# Patient Record
Sex: Male | Born: 1970
Health system: Southern US, Community
[De-identification: ages and names within clinical notes are randomized; demographics above are authoritative.]

## PROBLEM LIST (undated history)

## (undated) DIAGNOSIS — I10 Essential (primary) hypertension: Secondary | ICD-10-CM

## (undated) DIAGNOSIS — I2510 Atherosclerotic heart disease of native coronary artery without angina pectoris: Secondary | ICD-10-CM

## (undated) DIAGNOSIS — I213 ST elevation (STEMI) myocardial infarction of unspecified site: Secondary | ICD-10-CM

## (undated) DIAGNOSIS — R002 Palpitations: Secondary | ICD-10-CM

## (undated) DIAGNOSIS — Z8249 Family history of ischemic heart disease and other diseases of the circulatory system: Secondary | ICD-10-CM

## (undated) DIAGNOSIS — I5021 Acute systolic (congestive) heart failure: Secondary | ICD-10-CM

## (undated) DIAGNOSIS — E785 Hyperlipidemia, unspecified: Secondary | ICD-10-CM

## (undated) HISTORY — DX: Palpitations: R00.2

## (undated) HISTORY — DX: Essential (primary) hypertension: I10

## (undated) HISTORY — DX: Family history of ischemic heart disease and other diseases of the circulatory system: Z82.49

---

## 2014-02-17 ENCOUNTER — Encounter: Payer: Self-pay | Admitting: Cardiovascular Disease

## 2014-02-17 ENCOUNTER — Ambulatory Visit (INDEPENDENT_AMBULATORY_CARE_PROVIDER_SITE_OTHER): Payer: 59 | Admitting: Cardiovascular Disease

## 2014-02-17 VITALS — BP 136/98 | HR 76 | Ht 74.0 in | Wt 261.0 lb

## 2014-02-17 DIAGNOSIS — I10 Essential (primary) hypertension: Secondary | ICD-10-CM

## 2014-02-17 DIAGNOSIS — R002 Palpitations: Secondary | ICD-10-CM | POA: Insufficient documentation

## 2014-02-17 NOTE — Assessment & Plan Note (Signed)
Patient was referred for new-onset palpitations over the last 3 or 4 months. It occurs several times a week and lasts for up to 30 seconds at a time associated with chest pressure and shortness of breath. He's never had syncope. They are rapid onset and offset. I am going to obtain a 2-D echocardiogram, one-month event monitor and routine GXT

## 2014-02-17 NOTE — Progress Notes (Signed)
     02/17/2014 Devin Wright   10/30/1970  [id number]  Primary Physician Londell MohPHARR,WALTER DAVIDSON, MD Primary Cardiologist: Runell GessJonathan J. 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 He was referred by Dr. Merri BrunetteWalter Pharr for cardiovascular evaluation because of new-onset palpitations. His cardiac risk factor profile is remarkable for  Treated hypertension and family history the father had bypass surgery in his early 2360s. He also has hyperlipidemia. By history he has obstructive sleep apnea which is in the process of being worked up. In January he noticed onset of palpitations which occur several times a week lasting up to 30 seconds her time which were rapid onset and offset associated with chest discomfort and shortness of breath. Dr. Renne CriglerPharr has obtained lab work which I assume included thyroid function tests.   Current Outpatient Prescriptions  Medication Sig Dispense Refill  . losartan-hydrochlorothiazide (HYZAAR) 100-12.5 MG per tablet        No current facility-administered medications for this visit.    No Known Allergies  History   Social History  . Marital Status: Married    Spouse Name: N/A    Number of Children: N/A  . Years of Education: N/A   Occupational History  . Not on file.   Social History Main Topics  . Smoking status: Never Smoker   . Smokeless tobacco: Not on file  . Alcohol Use: Not on file  . Drug Use: Not on file  . Sexual Activity: Not on file   Other Topics Concern  . Not on file   Social History Narrative  . No narrative on file     Review of Systems: General: negative for chills, fever, night sweats or weight changes.  Cardiovascular: negative for chest pain, dyspnea on exertion, edema, orthopnea, palpitations, paroxysmal nocturnal dyspnea or shortness of breath Dermatological:  negative for rash Respiratory: negative for cough or wheezing Urologic: negative for hematuria Abdominal: negative for nausea, vomiting, diarrhea, bright red blood per rectum, melena, or hematemesis Neurologic: negative for visual changes, syncope, or dizziness All other systems reviewed and are otherwise negative except as noted above.    Blood pressure 136/98, pulse 76, height 6\' 2"  (1.88 m), weight 261 lb (118.389 kg).  General appearance: alert and no distress Neck: no adenopathy, no carotid bruit, no JVD, supple, symmetrical, trachea midline and thyroid not enlarged, symmetric, no tenderness/mass/nodules Lungs: clear to auscultation bilaterally Heart: regular rate and rhythm, S1, S2 normal, no murmur, click, rub or gallop Extremities: extremities normal, atraumatic, no cyanosis or edema  EKG not performed today  ASSESSMENT AND PLAN:   Essential hypertension On losartan hydrochlorothiazide with blood pressures elevated today at 136/98 followed by his PCP  Palpitations Patient was referred for new-onset palpitations over the last 3 or 4 months. It occurs several times a week and lasts for up to 30 seconds at a time associated with chest pressure and shortness of breath. He's never had syncope. They are rapid onset and offset. I am going to obtain a 2-D echocardiogram, one-month event monitor and routine GXT      Runell GessJonathan J. Richanda Darin MD [facility] Allied DiseasesFACP,FACC,FAHA, [facility] Junction CityFSCAI 02/17/2014 3:07 PM

## 2014-02-17 NOTE — Patient Instructions (Signed)
  We will see you back in follow up after the tests  Dr Allyson SabalBerry has ordered : 1.  Echocardiogram. Echocardiography is a painless test that uses sound waves to create images of your heart. It provides your doctor with information about the size and shape of your heart and how well your heart's chambers and valves are working. This procedure takes approximately one hour. There are no restrictions for this procedure.   2. Your physician has recommended that you wear an event monitor for 30 days. Event monitors are medical devices that record the heart's electrical activity. Doctors most often us these monitors to diagnose arrhythmias. Arrhythmias are problems with the speed or rhythm of the heartbeat. The monitor is a small, portable device. You can wear one while you do your normal daily activities. This is usually used to diagnose what is causing palpitations/syncope (passing out).  3. Your physician has requested that you have an exercise tolerance test. For further information please visit [email or web address]. Please also follow instruction sheet, as given.

## 2014-02-17 NOTE — Assessment & Plan Note (Signed)
On losartan hydrochlorothiazide with blood pressures elevated today at 136/98 followed by his PCP

## 2014-02-18 ENCOUNTER — Ambulatory Visit (HOSPITAL_COMMUNITY)
Admission: RE | Admit: 2014-02-18 | Discharge: 2014-02-18 | Disposition: A | Discharge status: Home / Self Care | Payer: 59 | Source: Ambulatory Visit | Attending: Cardiovascular Disease | Admitting: Cardiovascular Disease

## 2014-02-18 DIAGNOSIS — R002 Palpitations: Secondary | ICD-10-CM

## 2014-02-18 DIAGNOSIS — R079 Chest pain, unspecified: Secondary | ICD-10-CM

## 2014-02-18 DIAGNOSIS — R0602 Shortness of breath: Secondary | ICD-10-CM

## 2014-02-19 ENCOUNTER — Encounter: Payer: Self-pay | Admitting: Cardiovascular Disease

## 2014-02-26 ENCOUNTER — Ambulatory Visit (HOSPITAL_COMMUNITY)
Admission: RE | Admit: 2014-02-26 | Discharge: 2014-02-26 | Disposition: A | Discharge status: Home / Self Care | Payer: 59 | Source: Ambulatory Visit | Attending: Cardiology | Admitting: Cardiology

## 2014-02-26 DIAGNOSIS — R002 Palpitations: Secondary | ICD-10-CM | POA: Insufficient documentation

## 2014-02-26 DIAGNOSIS — I10 Essential (primary) hypertension: Secondary | ICD-10-CM | POA: Insufficient documentation

## 2014-02-26 NOTE — Progress Notes (Signed)
2D Echo Performed 02/26/2014    Nguyen Todorov, RCS  

## 2014-03-02 ENCOUNTER — Encounter: Payer: Self-pay | Admitting: *Deleted

## 2014-03-09 ENCOUNTER — Telehealth: Payer: Self-pay | Admitting: *Deleted

## 2014-03-09 NOTE — Telephone Encounter (Signed)
Letter mailed with treadmill results,

## 2014-03-09 NOTE — Telephone Encounter (Signed)
Message copied by Marella BileVOGEL, Kaizlee Carlino W. on Mon Mar 09, 2014  2:32 PM ------      Message from: Runell GessBERRY, JONATHAN J      Created: Sun Mar 08, 2014  3:55 PM      Regarding: GXT       Neg GXT.            JJB ------

## 2014-03-24 ENCOUNTER — Ambulatory Visit: Payer: 59 | Admitting: Cardiovascular Disease

## 2014-04-01 ENCOUNTER — Encounter: Payer: Self-pay | Admitting: *Deleted

## 2014-05-01 ENCOUNTER — Emergency Department: Payer: Self-pay | Admitting: Emergency Medicine

## 2016-11-01 ENCOUNTER — Other Ambulatory Visit: Payer: Self-pay | Admitting: Internal Medicine

## 2016-11-01 DIAGNOSIS — Z8249 Family history of ischemic heart disease and other diseases of the circulatory system: Secondary | ICD-10-CM

## 2017-12-07 ENCOUNTER — Other Ambulatory Visit: Payer: Self-pay | Admitting: Internal Medicine

## 2017-12-07 DIAGNOSIS — R1900 Intra-abdominal and pelvic swelling, mass and lump, unspecified site: Secondary | ICD-10-CM

## 2017-12-24 ENCOUNTER — Ambulatory Visit
Admission: RE | Admit: 2017-12-24 | Discharge: 2017-12-24 | Disposition: A | Discharge status: Home / Self Care | Payer: 59 | Source: Ambulatory Visit | Attending: Internal Medicine | Admitting: Internal Medicine

## 2017-12-24 DIAGNOSIS — R1900 Intra-abdominal and pelvic swelling, mass and lump, unspecified site: Secondary | ICD-10-CM

## 2017-12-24 MED ORDER — IOPAMIDOL (ISOVUE-300) INJECTION 61%
100.0000 mL | Freq: Once | INTRAVENOUS | Status: AC | PRN
  Administered 2017-12-24: 100 mL via INTRAVENOUS

## 2018-04-05 IMAGING — CT CT PELVIS W/ CM
1 series · 15 of 32 positions shown, 19 images · IV contrast (iopamidol)
Comparison: None.

CLINICAL DATA: Lump left groin marked with marker x 3 daysNo sx/
ca/ injury/ prev

EXAM:
CT PELVIS WITH CONTRAST
TECHNIQUE: Multidetector CT imaging of the pelvis was performed using the
standard protocol following the bolus administration of intravenous
contrast.
CONTRAST:  100mL SXOXBX-9ZZ IOPAMIDOL (SXOXBX-9ZZ) INJECTION 61%

[Series 2: routine pelvis w/cm · axial · 0.93mm/px · z∈[+532,+817]mm · 15 of 65 slices shown, 19 images]
[im 5/65  soft-tissue]
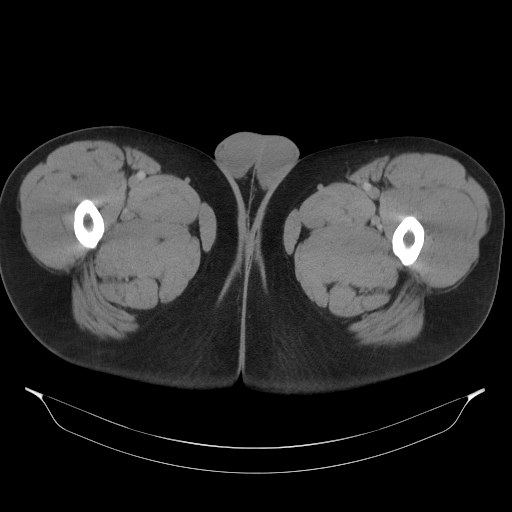
[im 5/65  bone]
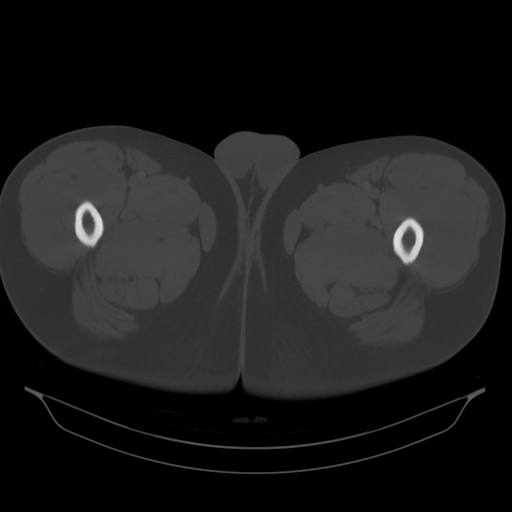
[im 9/65  soft-tissue]
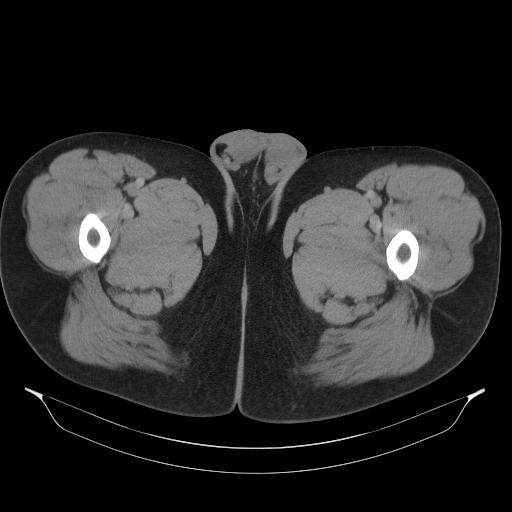
[im 13/65  soft-tissue]
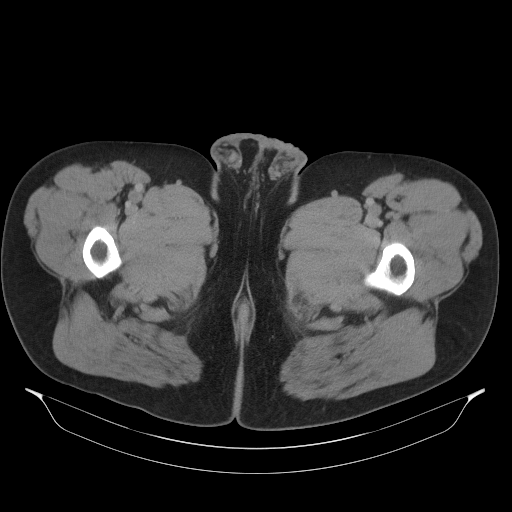
[im 19/65  soft-tissue]
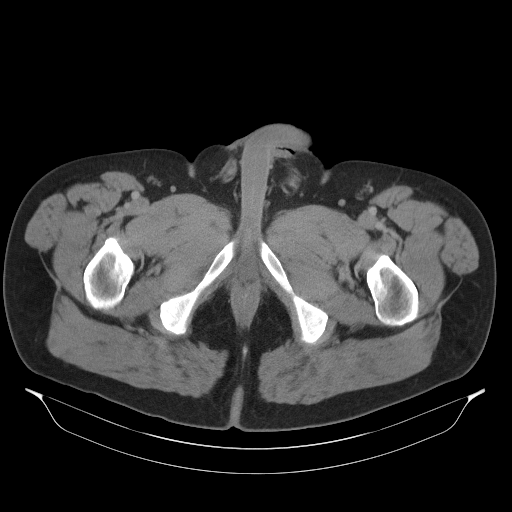
[im 23/65  soft-tissue]
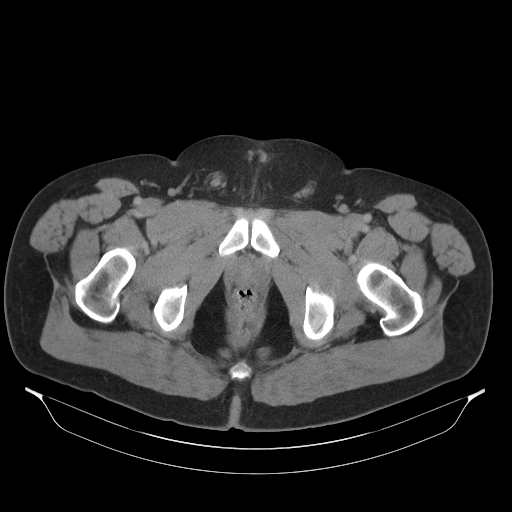
[im 27/65  soft-tissue]
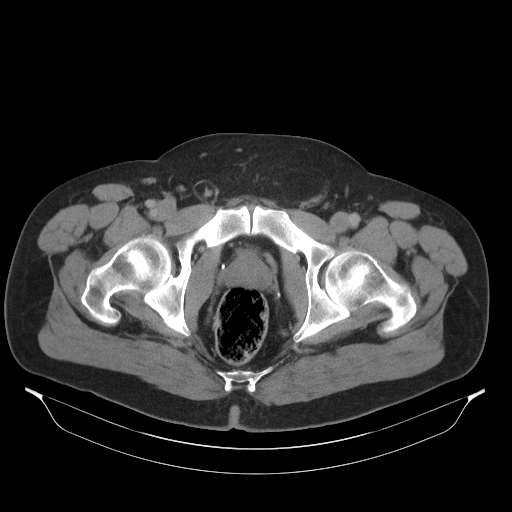
[im 34/65  soft-tissue]
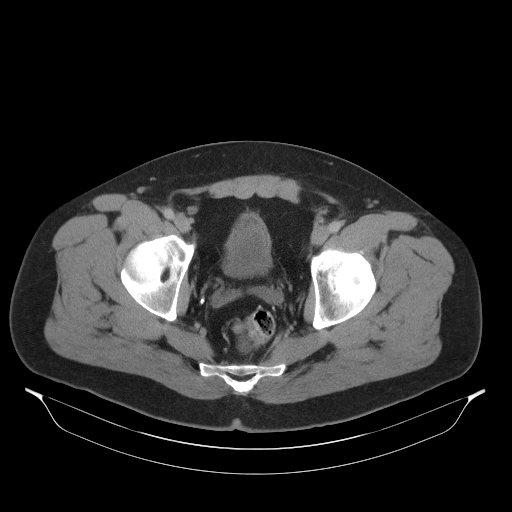
[im 38/65  soft-tissue]
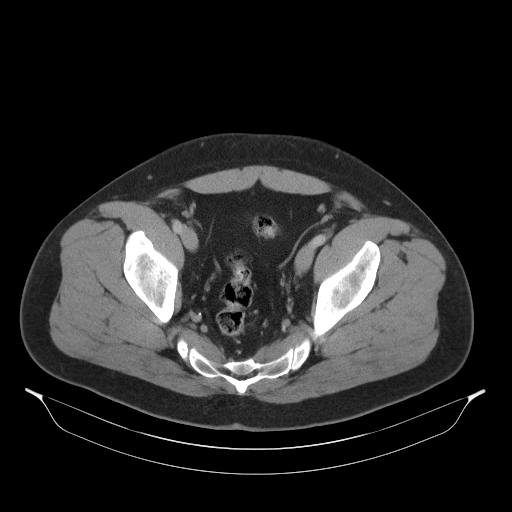
[im 42/65  soft-tissue]
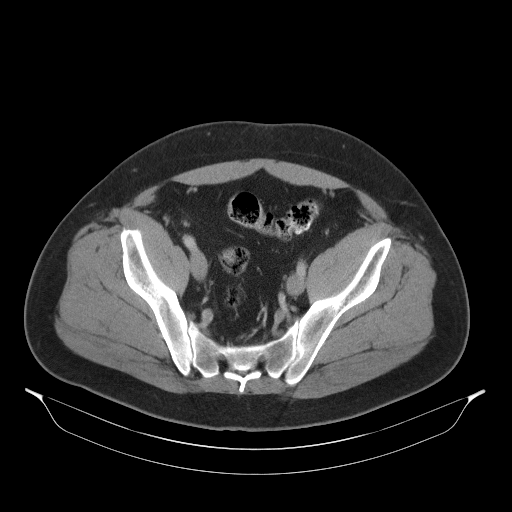
[im 42/65  bone]
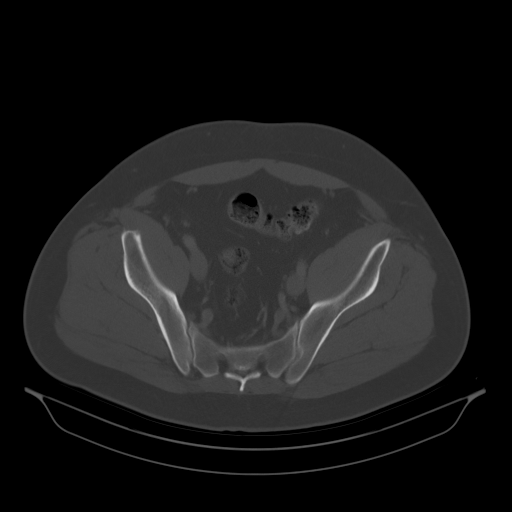
[im 46/65  soft-tissue]
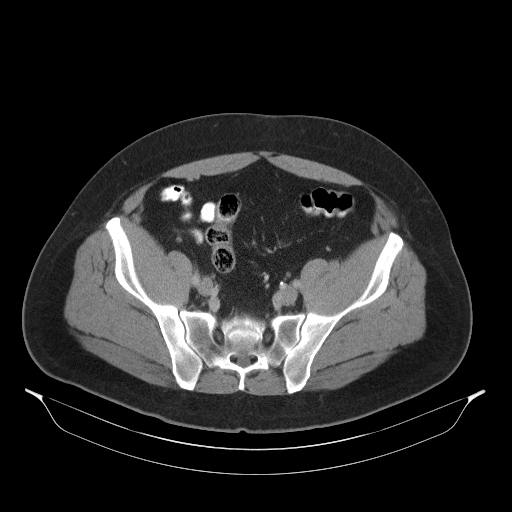
[im 52/65  soft-tissue]
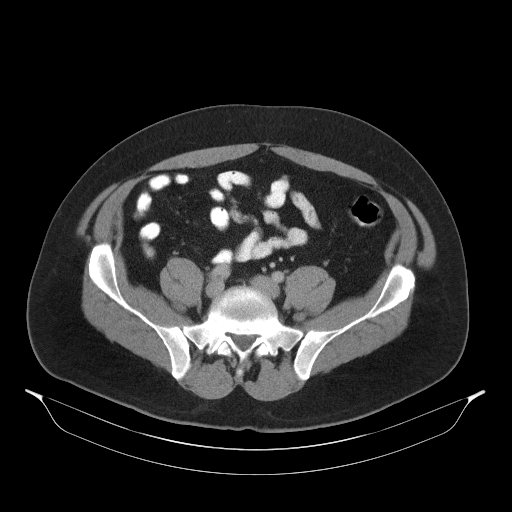
[im 56/65  soft-tissue]
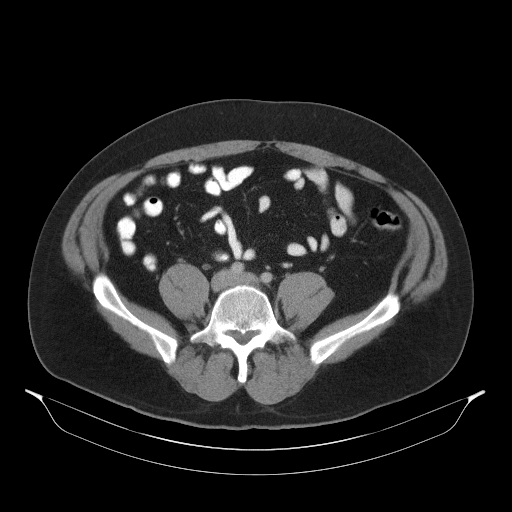
[im 56/65  lung]
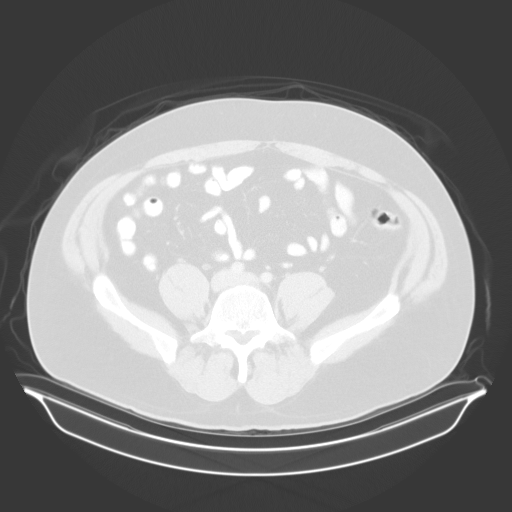
[im 58/65  lung]
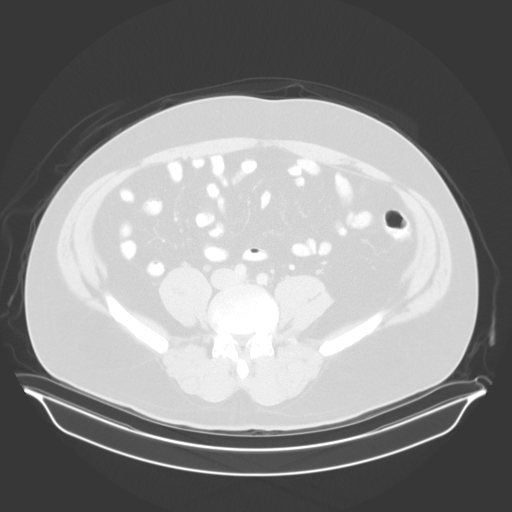
[im 60/65  soft-tissue]
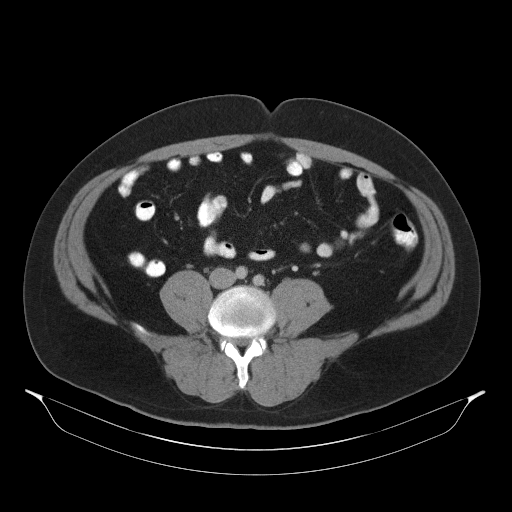
[im 60/65  lung]
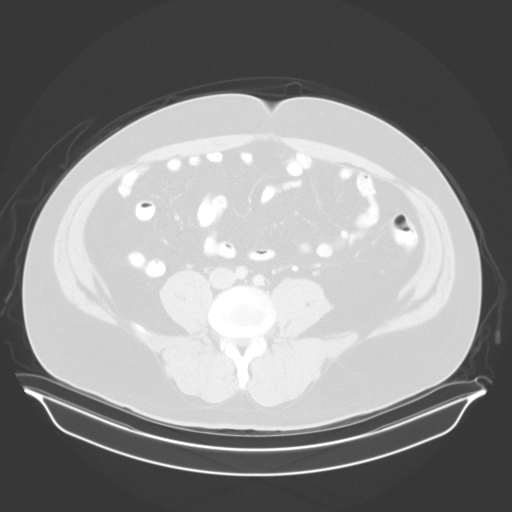
[im 62/65  lung]
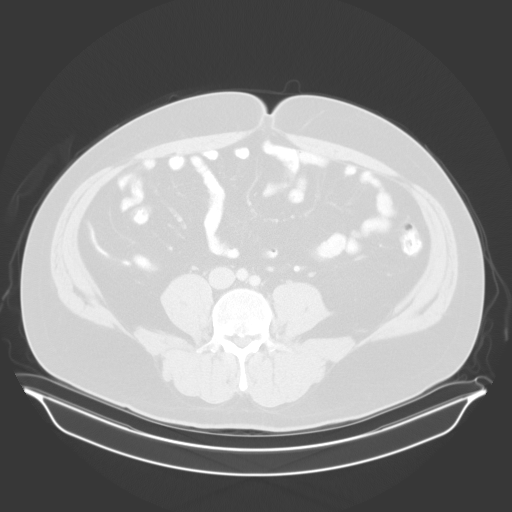

[15 of 32 positions shown; findings below may reference images not displayed]

FINDINGS: Urinary Tract:  Mostly decompressed.  Unremarkable.

Bowel: No bowel wall thickening or inflammation. No evidence of
obstruction. Normal appendix visualized.

Vascular/Lymphatic: Minor atherosclerotic calcifications along
internal iliac arteries bilaterally. No significant stenosis. No
aneurysm.

Reproductive:  Prostate normal in size.

Other: Fat has herniated through the left internal inguinal ring,
distending the inguinal canal on the left 24.8 cm transversely,
where the right measures 1.7 cm. The herniated fat, tracking along
the left spermatic cord, measures 6.8 cm in length. No bowel extends
into the hernia. The herniated fat corresponds to the reported
palpable lump.

There are no other findings to account for the patient's reported
lump. No soft tissue masses or inflammation. No adenopathy.

Musculoskeletal: No fracture or bone lesion. Loss of disc height
with mild spondylotic disc bulging at L5-S1.
IMPRESSION: 1. The reported lump in the left groin corresponds to fat herniated
through the left internal inguinal ring, distending the left
inguinal canal. There is no herniated bowel. No other findings to
account for the palpable lump.
2. Minor atherosclerotic calcifications along the internal iliac
arteries.
3. Mild disc degenerative change at L5-S1.  No other abnormalities.

## 2018-12-21 ENCOUNTER — Encounter (HOSPITAL_COMMUNITY)
Admission: EM | Disposition: A | Discharge status: Home / Self Care | Payer: Self-pay | Source: Home / Self Care | Attending: Interventional Cardiology

## 2018-12-21 ENCOUNTER — Other Ambulatory Visit: Payer: Self-pay

## 2018-12-21 ENCOUNTER — Inpatient Hospital Stay (HOSPITAL_COMMUNITY)
Admission: EM | Admit: 2018-12-21 | Discharge: 2018-12-24 | DRG: 246 | Disposition: A | Discharge status: Home / Self Care | Payer: 59 | Attending: Interventional Cardiology | Admitting: Interventional Cardiology

## 2018-12-21 ENCOUNTER — Encounter (HOSPITAL_COMMUNITY): Payer: Self-pay | Admitting: Emergency Medicine

## 2018-12-21 DIAGNOSIS — I110 Hypertensive heart disease with heart failure: Secondary | ICD-10-CM | POA: Diagnosis present

## 2018-12-21 DIAGNOSIS — Z79899 Other long term (current) drug therapy: Secondary | ICD-10-CM | POA: Diagnosis not present

## 2018-12-21 DIAGNOSIS — I10 Essential (primary) hypertension: Secondary | ICD-10-CM | POA: Diagnosis present

## 2018-12-21 DIAGNOSIS — Z6835 Body mass index (BMI) 35.0-35.9, adult: Secondary | ICD-10-CM | POA: Diagnosis not present

## 2018-12-21 DIAGNOSIS — Z8249 Family history of ischemic heart disease and other diseases of the circulatory system: Secondary | ICD-10-CM | POA: Diagnosis not present

## 2018-12-21 DIAGNOSIS — I2510 Atherosclerotic heart disease of native coronary artery without angina pectoris: Secondary | ICD-10-CM | POA: Diagnosis present

## 2018-12-21 DIAGNOSIS — I213 ST elevation (STEMI) myocardial infarction of unspecified site: Secondary | ICD-10-CM

## 2018-12-21 DIAGNOSIS — E785 Hyperlipidemia, unspecified: Secondary | ICD-10-CM | POA: Diagnosis present

## 2018-12-21 DIAGNOSIS — R079 Chest pain, unspecified: Secondary | ICD-10-CM | POA: Diagnosis not present

## 2018-12-21 DIAGNOSIS — I5041 Acute combined systolic (congestive) and diastolic (congestive) heart failure: Secondary | ICD-10-CM | POA: Diagnosis present

## 2018-12-21 DIAGNOSIS — I2102 ST elevation (STEMI) myocardial infarction involving left anterior descending coronary artery: Secondary | ICD-10-CM | POA: Diagnosis not present

## 2018-12-21 DIAGNOSIS — Z955 Presence of coronary angioplasty implant and graft: Secondary | ICD-10-CM

## 2018-12-21 DIAGNOSIS — E663 Overweight: Secondary | ICD-10-CM | POA: Diagnosis present

## 2018-12-21 DIAGNOSIS — I255 Ischemic cardiomyopathy: Secondary | ICD-10-CM | POA: Diagnosis present

## 2018-12-21 HISTORY — DX: ST elevation (STEMI) myocardial infarction of unspecified site: I21.3

## 2018-12-21 HISTORY — DX: ST elevation (STEMI) myocardial infarction involving left anterior descending coronary artery: I21.02

## 2018-12-21 HISTORY — DX: Acute systolic (congestive) heart failure: I50.21

## 2018-12-21 HISTORY — PX: CORONARY/GRAFT ACUTE MI REVASCULARIZATION: CATH118305

## 2018-12-21 HISTORY — PX: LEFT HEART CATH AND CORONARY ANGIOGRAPHY: CATH118249

## 2018-12-21 HISTORY — DX: Hyperlipidemia, unspecified: E78.5

## 2018-12-21 LAB — CBC WITH DIFFERENTIAL/PLATELET
Abs Immature Granulocytes: 0.07 10*3/uL (ref 0.00–0.07)
BASOS PCT: 1 %
Basophils Absolute: 0.1 10*3/uL (ref 0.0–0.1)
Eosinophils Absolute: 0.1 10*3/uL (ref 0.0–0.5)
Eosinophils Relative: 1 %
HCT: 50.3 % (ref 39.0–52.0)
Hemoglobin: 17.3 g/dL — ABNORMAL HIGH (ref 13.0–17.0)
Immature Granulocytes: 1 %
Lymphocytes Relative: 19 %
Lymphs Abs: 1.6 10*3/uL (ref 0.7–4.0)
MCH: 28.3 pg (ref 26.0–34.0)
MCHC: 34.4 g/dL (ref 30.0–36.0)
MCV: 82.2 fL (ref 80.0–100.0)
Monocytes Absolute: 0.7 10*3/uL (ref 0.1–1.0)
Monocytes Relative: 9 %
Neutro Abs: 6.1 10*3/uL (ref 1.7–7.7)
Neutrophils Relative %: 69 %
PLATELETS: 213 10*3/uL (ref 150–400)
RBC: 6.12 MIL/uL — ABNORMAL HIGH (ref 4.22–5.81)
RDW: 11.9 % (ref 11.5–15.5)
WBC: 8.7 10*3/uL (ref 4.0–10.5)
nRBC: 0 % (ref 0.0–0.2)

## 2018-12-21 LAB — COMPREHENSIVE METABOLIC PANEL
ALBUMIN: 4.4 g/dL (ref 3.5–5.0)
ALT: 52 U/L — AB (ref 0–44)
AST: 39 U/L (ref 15–41)
Alkaline Phosphatase: 82 U/L (ref 38–126)
Anion gap: 15 (ref 5–15)
BUN: 13 mg/dL (ref 6–20)
CO2: 16 mmol/L — AB (ref 22–32)
CREATININE: 1.39 mg/dL — AB (ref 0.61–1.24)
Calcium: 9.3 mg/dL (ref 8.9–10.3)
Chloride: 105 mmol/L (ref 98–111)
GFR calc non Af Amer: >60 mL/min (ref >60)
GLUCOSE: 116 mg/dL — AB (ref 70–99)
Potassium: 4.3 mmol/L (ref 3.5–5.1)
Sodium: 136 mmol/L (ref 135–145)
Total Bilirubin: 1 mg/dL (ref 0.3–1.2)
Total Protein: 6.8 g/dL (ref 6.5–8.1)

## 2018-12-21 LAB — MRSA PCR SCREENING: MRSA by PCR: NEGATIVE

## 2018-12-21 LAB — I-STAT TROPONIN, ED: Troponin i, poc: 0.02 ng/mL (ref 0.00–0.08)

## 2018-12-21 LAB — BRAIN NATRIURETIC PEPTIDE: B Natriuretic Peptide: 15.8 pg/mL (ref 0.0–100.0)

## 2018-12-21 LAB — PROTIME-INR
INR: 1.1 (ref 0.8–1.2)
Prothrombin Time: 14 seconds (ref 11.4–15.2)

## 2018-12-21 LAB — LIPID PANEL
CHOLESTEROL: 208 mg/dL — AB (ref 0–200)
HDL: 30 mg/dL — AB (ref >40)
LDL Cholesterol: 140 mg/dL — ABNORMAL HIGH (ref 0–99)
Total CHOL/HDL Ratio: 6.9 RATIO
Triglycerides: 192 mg/dL — ABNORMAL HIGH (ref <150)
VLDL: 38 mg/dL (ref 0–40)

## 2018-12-21 LAB — TROPONIN I
Troponin I: <0.03 ng/mL (ref <0.03)
Troponin I: >65 ng/mL (ref <0.03)

## 2018-12-21 LAB — APTT: aPTT: 25 seconds (ref 24–36)

## 2018-12-21 SURGERY — CORONARY/GRAFT ACUTE MI REVASCULARIZATION
Anesthesia: LOCAL

## 2018-12-21 MED ORDER — FENTANYL CITRATE (PF) 100 MCG/2ML IJ SOLN
25.0000 ug | Freq: Once | INTRAMUSCULAR | Status: AC
  Administered 2018-12-21: 25 ug via INTRAVENOUS

## 2018-12-21 MED ORDER — HYDRALAZINE HCL 20 MG/ML IJ SOLN: 5.0000 mg | INTRAMUSCULAR | Status: AC | PRN

## 2018-12-21 MED ORDER — SODIUM CHLORIDE 0.9 % IV SOLN: 250.0000 mL | INTRAVENOUS | Status: DC | PRN

## 2018-12-21 MED ORDER — HEPARIN (PORCINE) IN NACL 1000-0.9 UT/500ML-% IV SOLN
INTRAVENOUS | Status: DC | PRN
  Administered 2018-12-21 (×2): 500 mL

## 2018-12-21 MED ORDER — HEPARIN SODIUM (PORCINE) 1000 UNIT/ML IJ SOLN
INTRAMUSCULAR | Status: DC | PRN
  Administered 2018-12-21: 7000 [IU] via INTRAVENOUS
  Administered 2018-12-21: 3000 [IU] via INTRAVENOUS

## 2018-12-21 MED ORDER — ONDANSETRON HCL 4 MG/2ML IJ SOLN: 4.0000 mg | Freq: Four times a day (QID) | INTRAMUSCULAR | Status: DC | PRN

## 2018-12-21 MED ORDER — HEPARIN (PORCINE) IN NACL 1000-0.9 UT/500ML-% IV SOLN
INTRAVENOUS | Status: AC
  Filled 2018-12-21: qty 500

## 2018-12-21 MED ORDER — POTASSIUM CHLORIDE CRYS ER 20 MEQ PO TBCR
EXTENDED_RELEASE_TABLET | ORAL | Status: AC
  Filled 2018-12-21: qty 2

## 2018-12-21 MED ORDER — TIROFIBAN (AGGRASTAT) BOLUS VIA INFUSION
INTRAVENOUS | Status: DC | PRN
  Administered 2018-12-21: 3117.5 ug via INTRAVENOUS

## 2018-12-21 MED ORDER — SODIUM CHLORIDE 0.9 % IV SOLN: INTRAVENOUS | Status: DC

## 2018-12-21 MED ORDER — TIROFIBAN HCL IN NACL 5-0.9 MG/100ML-% IV SOLN
0.1500 ug/kg/min | INTRAVENOUS | Status: DC
  Administered 2018-12-21 – 2018-12-22 (×3): 0.15 ug/kg/min via INTRAVENOUS
  Filled 2018-12-21 (×3): qty 100

## 2018-12-21 MED ORDER — CARVEDILOL 3.125 MG PO TABS
3.1250 mg | ORAL_TABLET | Freq: Two times a day (BID) | ORAL | Status: DC
  Administered 2018-12-21 – 2018-12-24 (×6): 3.125 mg via ORAL
  Filled 2018-12-21 (×6): qty 1

## 2018-12-21 MED ORDER — IOHEXOL 350 MG/ML SOLN
INTRAVENOUS | Status: DC | PRN
  Administered 2018-12-21: 50 mL via INTRA_ARTERIAL

## 2018-12-21 MED ORDER — FENTANYL CITRATE (PF) 100 MCG/2ML IJ SOLN
INTRAMUSCULAR | Status: AC
  Administered 2018-12-21: 25 ug
  Filled 2018-12-21: qty 2

## 2018-12-21 MED ORDER — MIDAZOLAM HCL 2 MG/2ML IJ SOLN
INTRAMUSCULAR | Status: DC | PRN
  Administered 2018-12-21: 0.5 mg via INTRAVENOUS

## 2018-12-21 MED ORDER — FENTANYL CITRATE (PF) 100 MCG/2ML IJ SOLN
INTRAMUSCULAR | Status: DC | PRN
  Administered 2018-12-21: 25 ug via INTRAVENOUS
  Administered 2018-12-21: 25 ug

## 2018-12-21 MED ORDER — TIROFIBAN HCL IN NACL 5-0.9 MG/100ML-% IV SOLN
INTRAVENOUS | Status: AC | PRN
  Administered 2018-12-21 (×2): 0.15 ug/kg/min via INTRAVENOUS

## 2018-12-21 MED ORDER — TIROFIBAN HCL IN NACL 5-0.9 MG/100ML-% IV SOLN
INTRAVENOUS | Status: AC
  Filled 2018-12-21: qty 100

## 2018-12-21 MED ORDER — FENTANYL CITRATE (PF) 100 MCG/2ML IJ SOLN
INTRAMUSCULAR | Status: AC
  Filled 2018-12-21: qty 2

## 2018-12-21 MED ORDER — POTASSIUM CHLORIDE CRYS ER 10 MEQ PO TBCR
EXTENDED_RELEASE_TABLET | ORAL | Status: DC | PRN
  Administered 2018-12-21: 40 meq via ORAL

## 2018-12-21 MED ORDER — NITROGLYCERIN 1 MG/10 ML FOR IR/CATH LAB
INTRA_ARTERIAL | Status: AC
  Filled 2018-12-21: qty 10

## 2018-12-21 MED ORDER — HEPARIN SODIUM (PORCINE) 5000 UNIT/ML IJ SOLN: 60.0000 [IU]/kg | Freq: Once | INTRAMUSCULAR | Status: DC

## 2018-12-21 MED ORDER — TICAGRELOR 90 MG PO TABS
ORAL_TABLET | ORAL | Status: DC | PRN
  Administered 2018-12-21: 180 mg via ORAL

## 2018-12-21 MED ORDER — VERAPAMIL HCL 2.5 MG/ML IV SOLN
INTRAVENOUS | Status: DC | PRN
  Administered 2018-12-21: 10 mL via INTRA_ARTERIAL

## 2018-12-21 MED ORDER — HEPARIN SODIUM (PORCINE) 5000 UNIT/ML IJ SOLN
INTRAMUSCULAR | Status: AC
  Administered 2018-12-21: 4000 [IU]
  Filled 2018-12-21: qty 1

## 2018-12-21 MED ORDER — SODIUM CHLORIDE 0.9% FLUSH
3.0000 mL | INTRAVENOUS | Status: DC | PRN
  Administered 2018-12-22: 3 mL via INTRAVENOUS
  Filled 2018-12-21: qty 3

## 2018-12-21 MED ORDER — TICAGRELOR 90 MG PO TABS
90.0000 mg | ORAL_TABLET | Freq: Two times a day (BID) | ORAL | Status: DC
  Administered 2018-12-21 – 2018-12-24 (×6): 90 mg via ORAL
  Filled 2018-12-21 (×6): qty 1

## 2018-12-21 MED ORDER — HEPARIN SODIUM (PORCINE) 1000 UNIT/ML IJ SOLN
INTRAMUSCULAR | Status: AC
  Filled 2018-12-21: qty 1

## 2018-12-21 MED ORDER — ACETAMINOPHEN 325 MG PO TABS
650.0000 mg | ORAL_TABLET | ORAL | Status: DC | PRN
  Administered 2018-12-22 (×2): 650 mg via ORAL
  Filled 2018-12-21 (×2): qty 2

## 2018-12-21 MED ORDER — SODIUM CHLORIDE 0.9 % IV SOLN
INTRAVENOUS | Status: AC
  Administered 2018-12-21: 14:00:00 via INTRAVENOUS

## 2018-12-21 MED ORDER — TICAGRELOR 90 MG PO TABS
ORAL_TABLET | ORAL | Status: AC
  Filled 2018-12-21: qty 2

## 2018-12-21 MED ORDER — LIDOCAINE HCL (PF) 1 % IJ SOLN
INTRAMUSCULAR | Status: AC
  Filled 2018-12-21: qty 30

## 2018-12-21 MED ORDER — OXYCODONE HCL 5 MG PO TABS: 5.0000 mg | ORAL_TABLET | ORAL | Status: DC | PRN

## 2018-12-21 MED ORDER — IOHEXOL 350 MG/ML SOLN
INTRAVENOUS | Status: DC | PRN
  Administered 2018-12-21: 255 mL via INTRA_ARTERIAL

## 2018-12-21 MED ORDER — VERAPAMIL HCL 2.5 MG/ML IV SOLN
INTRAVENOUS | Status: AC
  Filled 2018-12-21: qty 2

## 2018-12-21 MED ORDER — LOSARTAN POTASSIUM 50 MG PO TABS
50.0000 mg | ORAL_TABLET | Freq: Every day | ORAL | Status: DC
  Administered 2018-12-21 – 2018-12-22 (×2): 50 mg via ORAL
  Filled 2018-12-21 (×2): qty 1

## 2018-12-21 MED ORDER — HEPARIN SODIUM (PORCINE) 5000 UNIT/ML IJ SOLN
5000.0000 [IU] | Freq: Three times a day (TID) | INTRAMUSCULAR | Status: DC
  Administered 2018-12-21 – 2018-12-23 (×5): 5000 [IU] via SUBCUTANEOUS
  Filled 2018-12-21 (×6): qty 1

## 2018-12-21 MED ORDER — ATORVASTATIN CALCIUM 80 MG PO TABS
80.0000 mg | ORAL_TABLET | Freq: Every day | ORAL | Status: DC
  Administered 2018-12-21 – 2018-12-23 (×3): 80 mg via ORAL
  Filled 2018-12-21 (×3): qty 1

## 2018-12-21 MED ORDER — LIDOCAINE HCL (PF) 1 % IJ SOLN
INTRAMUSCULAR | Status: DC | PRN
  Administered 2018-12-21: 2 mL

## 2018-12-21 MED ORDER — LABETALOL HCL 5 MG/ML IV SOLN: 10.0000 mg | INTRAVENOUS | Status: AC | PRN

## 2018-12-21 MED ORDER — SODIUM CHLORIDE 0.9% FLUSH
3.0000 mL | Freq: Two times a day (BID) | INTRAVENOUS | Status: DC
  Administered 2018-12-22 – 2018-12-24 (×4): 3 mL via INTRAVENOUS

## 2018-12-21 MED ORDER — CARVEDILOL 3.125 MG PO TABS
3.1250 mg | ORAL_TABLET | Freq: Once | ORAL | Status: AC
  Administered 2018-12-21: 3.125 mg via ORAL
  Filled 2018-12-21: qty 1

## 2018-12-21 MED ORDER — MIDAZOLAM HCL 2 MG/2ML IJ SOLN
INTRAMUSCULAR | Status: AC
Start: 2018-12-21 — End: ?
  Filled 2018-12-21: qty 2

## 2018-12-21 MED ORDER — ASPIRIN 81 MG PO CHEW
81.0000 mg | CHEWABLE_TABLET | Freq: Every day | ORAL | Status: DC
  Administered 2018-12-22 – 2018-12-24 (×3): 81 mg via ORAL
  Filled 2018-12-21 (×3): qty 1

## 2018-12-21 SURGICAL SUPPLY — 19 items
BALLN SAPPHIRE 2.0X12 (BALLOONS) ×2
BALLN SAPPHIRE 2.5X15 (BALLOONS) ×2
BALLOON SAPPHIRE 2.0X12 (BALLOONS) IMPLANT
BALLOON SAPPHIRE 2.5X15 (BALLOONS) IMPLANT
CATH INFINITI JR4 5F (CATHETERS) ×1 IMPLANT
CATH VISTA GUIDE 6FR XBLAD3.5 (CATHETERS) ×1 IMPLANT
DEVICE RAD COMP TR BAND LRG (VASCULAR PRODUCTS) ×1 IMPLANT
GLIDESHEATH SLEND A-KIT 6F 22G (SHEATH) ×1 IMPLANT
GUIDEWIRE INQWIRE 1.5J.035X260 (WIRE) IMPLANT
INQWIRE 1.5J .035X260CM (WIRE) ×2
KIT ENCORE 26 ADVANTAGE (KITS) ×1 IMPLANT
KIT HEART LEFT (KITS) ×2 IMPLANT
PACK CARDIAC CATHETERIZATION (CUSTOM PROCEDURE TRAY) ×2 IMPLANT
SHEATH PROBE COVER 6X72 (BAG) ×1 IMPLANT
STENT SYNERGY DES 3X12 (Permanent Stent) ×1 IMPLANT
STENT SYNERGY DES 3X32 (Permanent Stent) ×1 IMPLANT
TRANSDUCER W/STOPCOCK (MISCELLANEOUS) ×2 IMPLANT
TUBING CIL FLEX 10 FLL-RA (TUBING) ×2 IMPLANT
WIRE ASAHI PROWATER 180CM (WIRE) ×1 IMPLANT

## 2018-12-21 NOTE — H&P (Addendum)
Cardiology Admission History and Physical:   Patient ID: Devin Wright MRN: [id number]; DOB: 17-Feb-1971   Admission date: 12/21/2018  Primary Care Provider: Merri Brunette, MD Primary Cardiologist: No primary care provider on file.  Primary Electrophysiologist:  None   Chief Complaint: Chest pain starting at 10:45 AM post exercise  Patient Profile:   Devin Wright is a 48 y.o. male with history of hypertension and strong family history of CAD.  He is overweight and physically and active.  History of Present Illness:   Mr. Wright after his first [employer] exercise training in 3 years, he developed chest discomfort and arm discomfort while walking to his car.  EMS was summoned and EKG demonstrated widespread ST segment elevation.  No prior is significant history.  Family history is significant including his father and a sister with CAD.  He is not diabetic.  He does not smoke.   Past Medical History:  Diagnosis Date  . Family history of heart disease   . HTN (hypertension)   . Palpitations     History reviewed. No pertinent surgical history.   Medications Prior to Admission: Prior to Admission medications   Medication Sig Start Date End Date Taking? Authorizing Provider  losartan-hydrochlorothiazide Mauri Reading) 100-12.5 MG per tablet  02/10/14   [provider]     Allergies:   No Known Allergies  Social History:   Social History   Socioeconomic History  . Marital status: Married    Spouse name: Not on file  . Number of children: Not on file  . Years of education: Not on file  . Highest education level: Not on file  Occupational History  . Not on file  Social Needs  . Financial resource strain: Not on file  . Food insecurity:    Worry: Not on file    Inability: Not on file  . Transportation needs:    Medical: Not on file    Non-medical: Not on file  Tobacco Use  . Smoking status: Never Smoker  Substance and Sexual Activity  . Alcohol use: Not on file  .  Drug use: Not on file  . Sexual activity: Not on file  Lifestyle  . Physical activity:    Days per week: Not on file    Minutes per session: Not on file  . Stress: Not on file  Relationships  . Social connections:    Talks on phone: Not on file    Gets together: Not on file    Attends religious service: Not on file    Active member of club or organization: Not on file    Attends meetings of clubs or organizations: Not on file    Relationship status: Not on file  . Intimate partner violence:    Fear of current or ex partner: Not on file    Emotionally abused: Not on file    Physically abused: Not on file    Forced sexual activity: Not on file  Other Topics Concern  . Not on file  Social History Narrative  . Not on file    Family History:   The patient's family history is not on file.    ROS:  Please see the history of present illness.  Physically and active.  Overweight.  All other ROS reviewed and negative.     Physical Exam/Data:   Vitals:   12/21/18 1127 12/21/18 1136 12/21/18 1205  BP: 136/83 (!) 144/79   Pulse: 88 79   Resp: (!) 23 14  Temp: 98.1 F (36.7 C)    TempSrc: Oral    SpO2: 100% 100% 99%  Weight: 124.7 kg    Height: 6\' 2"  (1.88 m)     No intake or output data in the 24 hours ending 12/21/18 1328 Last 3 Weights 12/21/2018 02/17/2014  Weight (lbs) 275 lb 261 lb  Weight (kg) 124.739 kg 118.389 kg     Body mass index is 35.31 kg/m.  General: Moderate obesity, acute distress HEENT: normal Lymph: no adenopathy Neck: 2+ carotid JVD Endocrine:  No thryomegaly Vascular: No carotid bruits; FA pulses 2+ bilaterally without bruits  Cardiac:  normal S1, S2; RRR; no murmur  Lungs:  clear to auscultation bilaterally, no wheezing, rhonchi or rales  Abd: soft, nontender, no hepatomegaly  Ext: no edema Musculoskeletal:  No deformities, BUE and BLE strength normal and equal Skin: warm and dry  Neuro:  CNs 2-12 intact, no focal abnormalities noted Psych:   Normal affect    EKG:  The ECG that was done on 12/18/2018 was personally reviewed and demonstrates massive anterior ST elevation V1 through V5.  Relevant CV Studies: No prior data  Laboratory Data:  Chemistry Recent Labs  Lab 12/21/18 1120  NA 136  K 4.3  CL 105  CO2 16*  GLUCOSE 116*  BUN 13  CREATININE 1.39*  CALCIUM 9.3  GFRNONAA >60  GFRAA >60  ANIONGAP 15    Recent Labs  Lab 12/21/18 1120  PROT 6.8  ALBUMIN 4.4  AST 39  ALT 52*  ALKPHOS 82  BILITOT 1.0   Hematology Recent Labs  Lab 12/21/18 1120  WBC 8.7  RBC 6.12*  HGB 17.3*  HCT 50.3  MCV 82.2  MCH 28.3  MCHC 34.4  RDW 11.9  PLT 213   Cardiac Enzymes Recent Labs  Lab 12/21/18 1120  TROPONINI <0.03    Recent Labs  Lab 12/21/18 1132  TROPIPOC 0.02    BNPNo results for input(s): BNP, PROBNP in the last 168 hours.  DDimer No results for input(s): DDIMER in the last 168 hours.  Radiology/Studies:  No results found.  Assessment and Plan:   1. Acute anterior ST elevation myocardial infarction onset less than an hour prior to arrival after an intense exercise workout.  The 2. Hypertension 3. Unknown lipid status  Emergency catheterization with mechanical reperfusion as indicated by anatomy.  Severity of Illness: The appropriate patient status for this patient is INPATIENT. Inpatient status is judged to be reasonable and necessary in order to provide the required intensity of service to ensure the patient's safety. The patient's presenting symptoms, physical exam findings, and initial radiographic and laboratory data in the context of their chronic comorbidities is felt to place them at high risk for further clinical deterioration. Furthermore, it is not anticipated that the patient will be medically stable for discharge from the hospital within 2 midnights of admission. The following factors support the patient status of inpatient.   " The patient's presenting symptoms include chest  pain. " The worrisome physical exam findings include markedly abnormal EKG. " The initial radiographic and laboratory data are worrisome because of massive anterior wall ischemia. " The chronic co-morbidities include hypertension.   * I certify that at the point of admission it is my clinical judgment that the patient will require inpatient hospital care spanning beyond 2 midnights from the point of admission due to high intensity of service, high risk for further deterioration and high frequency of surveillance required.*    For questions or  updates, please contact [facility] Please consult [email or web address] for contact info under   CRITICAL CARE TIME - 37 min     Signed, Lesleigh Noe, MD  12/21/2018 1:28 PM

## 2018-12-21 NOTE — ED Triage Notes (Signed)
Per EMS- pt was at the gym working out when he became short of breath, with tingling to his hands. Pain was initially 7/10 across the chest, now 2/10 after receiving 2 nitro, 324 Asprin, and 4mg  zofran. 18G PIV placed to left AC by EMS.

## 2018-12-21 NOTE — ED Provider Notes (Signed)
[facility] CARDIAC CATH LAB Provider Note   CSN: [id number] Arrival date & time: 12/21/18  1123    History   Chief Complaint Chief Complaint  Patient presents with  . Code STEMI    HPI Devin Wright is a 48 y.o. male.     48yo M w/ h/o HTN who p/w chest pain. Just PTA, he was at [employer] working out, doing fairly rigorous physical activity.  He reports feeling shortness of breath and nausea during the exercise but states that it was not unusual given the level of exertion.  After he finished and went to his car, he began having central chest tightness across his entire chest and then the pain began radiating down his arms with numbness and tingling of his fingertips.  He began feeling clammy and continued to feel short of breath.  EMS was called and they noted ST elevation, code STEMI was activated in route.  He denies any tobacco, alcohol, or drug use.  He does not take any daily medications.  Family history notable for father who began having problems with CAD in his 51s.  The history is provided by the patient.    Past Medical History:  Diagnosis Date  . Family history of heart disease   . HTN (hypertension)   . Palpitations     Patient Active Problem List   Diagnosis Date Noted  . Palpitations 02/17/2014  . Essential hypertension 02/17/2014    History reviewed. No pertinent surgical history.      Home Medications    Prior to Admission medications   Medication Sig Start Date End Date Taking? Authorizing Provider  losartan-hydrochlorothiazide Mauri Reading) 100-12.5 MG per tablet  02/10/14   [provider]    Family History No family history on file.  Social History Social History   Tobacco Use  . Smoking status: Never Smoker  Substance Use Topics  . Alcohol use: Not on file  . Drug use: Not on file     Allergies   Patient has no known allergies.   Review of Systems Review of Systems  Unable to perform ROS: Acuity of  condition     Physical Exam Updated Vital Signs BP (!) 144/79   Pulse 79   Temp 98.1 F (36.7 C) (Oral)   Resp 14   Ht 6\' 2"  (1.88 m)   Wt 124.7 kg   SpO2 99%   BMI 35.31 kg/m   Physical Exam Vitals signs and nursing note reviewed.  Constitutional:      General: He is not in acute distress.    Appearance: He is well-developed.  HENT:     Head: Normocephalic and atraumatic.     Nose: Nose normal.  Eyes:     Conjunctiva/sclera: Conjunctivae normal.  Neck:     Musculoskeletal: Neck supple.  Cardiovascular:     Rate and Rhythm: Normal rate and regular rhythm.     Heart sounds: Normal heart sounds. No murmur.  Pulmonary:     Breath sounds: Normal breath sounds.     Comments: Mildly dyspneic but able to speak in full sentences Abdominal:     General: Bowel sounds are normal. There is no distension.     Palpations: Abdomen is soft.     Tenderness: There is no abdominal tenderness.  Skin:    General: Skin is warm and dry.  Neurological:     Mental Status: He is alert and oriented to person, place, and time.     Comments:  Fluent speech  Psychiatric:        Judgment: Judgment normal.     Comments: Mildly anxious      ED Treatments / Results  Labs (all labs ordered are listed, but only abnormal results are displayed) Labs Reviewed  PROTIME-INR  APTT  CBC WITH DIFFERENTIAL/PLATELET  COMPREHENSIVE METABOLIC PANEL  TROPONIN I  LIPID PANEL  I-STAT TROPONIN, ED    EKG None  Radiology No results found.  Procedures .Critical Care Performed by: Laurence Spates, MD Authorized by: Laurence Spates, MD   Critical care provider statement:    Critical care time (minutes):  30   Critical care time was exclusive of:  Separately billable procedures and treating other patients   Critical care was necessary to treat or prevent imminent or life-threatening deterioration of the following conditions:  Cardiac failure   Critical care was time spent personally  by me on the following activities:  Development of treatment plan with patient or surrogate, discussions with consultants, evaluation of patient's response to treatment, examination of patient, obtaining history from patient or surrogate, ordering and performing treatments and interventions, ordering and review of laboratory studies and re-evaluation of patient's condition   (including critical care time)  Medications Ordered in ED Medications  0.9 %  sodium chloride infusion (has no administration in time range)  heparin injection 60 Units/kg ( Intravenous MAR Hold 12/21/18 1201)  fentaNYL (SUBLIMAZE) 100 MCG/2ML injection (has no administration in time range)  heparin 5000 UNIT/ML injection (4,000 Units  Given 12/21/18 1134)  fentaNYL (SUBLIMAZE) injection 25 mcg (25 mcg Intravenous Given 12/21/18 1136)  fentaNYL (SUBLIMAZE) 100 MCG/2ML injection (25 mcg  Given 12/21/18 1140)     Initial Impression / Assessment and Plan / ED Course  I have reviewed the triage vital signs and the nursing notes.  Pertinent labs & imaging results that were available during my care of the patient were reviewed by me and considered in my medical decision making (see chart for details).        On arrival, non-toxic, stable VS. EKG shows diffuse ST elevation particularly in lateral precordial leads.  He had received aspirin and nitroglycerin in route with some improvement but continued to have chest pain. Evaluated by Dr. Katrinka Blazing, cardiology. Received heparin in ED as well as fentanyl prior to transfer to cath lab for emergent heart catherization.  Final Clinical Impressions(s) / ED Diagnoses   Final diagnoses:  ST elevation myocardial infarction (STEMI), unspecified artery [facility])    ED Discharge Orders    None       Aziah Brostrom, Ambrose Finland, MD 12/21/18 [phone]

## 2018-12-21 NOTE — Progress Notes (Signed)
   12/21/18 1300  Clinical Encounter Type  Visited With Patient and family together;Family;Patient;Health care provider  Visit Type Initial;ED;Code;Other (Comment) (STEMI)  Referral From Other (Comment) (STEMI pg)  Spiritual Encounters  Spiritual Needs Emotional  Stress Factors  Patient Stress Factors Loss of control;Major life changes;Health changes  Family Stress Factors Loss of control   Met pt in presence of various members of medical team in trauma bay A.  Present when his wife, Nira Conn, arrived; she is an eye dr.  Elenor Legato presence, walked Heather up to [facility] waiting rm.  A friend joined her.  Ensured cath lab knew where to find spouse.  Myra Gianotti resident, [phone]

## 2018-12-22 LAB — COMPREHENSIVE METABOLIC PANEL
ALT: 72 U/L — ABNORMAL HIGH (ref 0–44)
AST: 164 U/L — ABNORMAL HIGH (ref 15–41)
Albumin: 3.8 g/dL (ref 3.5–5.0)
Alkaline Phosphatase: 79 U/L (ref 38–126)
Anion gap: 9 (ref 5–15)
BUN: 8 mg/dL (ref 6–20)
CO2: 21 mmol/L — ABNORMAL LOW (ref 22–32)
Calcium: 8.4 mg/dL — ABNORMAL LOW (ref 8.9–10.3)
Chloride: 107 mmol/L (ref 98–111)
Creatinine, Ser: 1.11 mg/dL (ref 0.61–1.24)
GFR calc Af Amer: >60 mL/min (ref >60)
Glucose, Bld: 102 mg/dL — ABNORMAL HIGH (ref 70–99)
Potassium: 3.6 mmol/L (ref 3.5–5.1)
Sodium: 137 mmol/L (ref 135–145)
Total Bilirubin: 1.7 mg/dL — ABNORMAL HIGH (ref 0.3–1.2)
Total Protein: 6.1 g/dL — ABNORMAL LOW (ref 6.5–8.1)

## 2018-12-22 LAB — CBC
HCT: 48.6 % (ref 39.0–52.0)
Hemoglobin: 16.5 g/dL (ref 13.0–17.0)
MCH: 28.2 pg (ref 26.0–34.0)
MCHC: 34 g/dL (ref 30.0–36.0)
MCV: 82.9 fL (ref 80.0–100.0)
Platelets: 213 10*3/uL (ref 150–400)
RBC: 5.86 MIL/uL — ABNORMAL HIGH (ref 4.22–5.81)
RDW: 12.2 % (ref 11.5–15.5)
WBC: 9.5 10*3/uL (ref 4.0–10.5)
nRBC: 0 % (ref 0.0–0.2)

## 2018-12-22 LAB — TROPONIN I
Troponin I: >65 ng/mL (ref <0.03)
Troponin I: 30.03 ng/mL (ref <0.03)

## 2018-12-22 LAB — MAGNESIUM: Magnesium: 2.2 mg/dL (ref 1.7–2.4)

## 2018-12-22 LAB — HEMOGLOBIN A1C
Hgb A1c MFr Bld: 4.8 % (ref 4.8–5.6)
Mean Plasma Glucose: 91.06 mg/dL

## 2018-12-22 LAB — POCT ACTIVATED CLOTTING TIME
Activated Clotting Time: 235 seconds
Activated Clotting Time: 378 seconds

## 2018-12-22 MED ORDER — SPIRONOLACTONE 12.5 MG HALF TABLET
12.5000 mg | ORAL_TABLET | Freq: Every day | ORAL | Status: DC
  Administered 2018-12-22 – 2018-12-24 (×3): 12.5 mg via ORAL
  Filled 2018-12-22 (×3): qty 1

## 2018-12-22 MED ORDER — PANTOPRAZOLE SODIUM 40 MG PO TBEC
40.0000 mg | DELAYED_RELEASE_TABLET | Freq: Every day | ORAL | Status: DC
  Administered 2018-12-22 – 2018-12-24 (×3): 40 mg via ORAL
  Filled 2018-12-22 (×3): qty 1

## 2018-12-22 MED ORDER — FUROSEMIDE 10 MG/ML IJ SOLN
40.0000 mg | Freq: Once | INTRAMUSCULAR | Status: AC
  Administered 2018-12-22: 40 mg via INTRAVENOUS
  Filled 2018-12-22: qty 4

## 2018-12-22 MED ORDER — HYDRALAZINE HCL 20 MG/ML IJ SOLN: 10.0000 mg | INTRAMUSCULAR | Status: DC | PRN

## 2018-12-22 MED ORDER — IBUPROFEN 200 MG PO TABS: 600.0000 mg | ORAL_TABLET | Freq: Four times a day (QID) | ORAL | Status: DC | PRN

## 2018-12-22 MED ORDER — SACUBITRIL-VALSARTAN 24-26 MG PO TABS
1.0000 | ORAL_TABLET | Freq: Two times a day (BID) | ORAL | Status: DC
  Administered 2018-12-22 – 2018-12-23 (×3): 1 via ORAL
  Filled 2018-12-22 (×3): qty 1

## 2018-12-22 NOTE — Progress Notes (Signed)
CRITICAL VALUE ALERT  Critical Value:  Troponin > 65  Date & Time Notied:  12/21/2018 2030  Provider Notified:  Dr. Lysle Morales  Orders Received/Actions taken: N/A

## 2018-12-22 NOTE — Progress Notes (Addendum)
Progress Note  Patient Name: Devin Wright [place] Date of Encounter: 12/22/2018  Primary Cardiologist: New to Dr. Tamala Julian  Subjective   Sore all over his body, however he had extensive workout yesterday first time. Mild dyspnea with deep breath. No angina. Up in chair eating lunch. BP was high overnight.   Inpatient Medications    Scheduled Meds: . aspirin  81 mg Oral Daily  . atorvastatin  80 mg Oral q1800  . carvedilol  3.125 mg Oral BID WC  . heparin  5,000 Units Subcutaneous Q8H  . losartan  50 mg Oral Daily  . pantoprazole  40 mg Oral Daily  . sodium chloride flush  3 mL Intravenous Q12H  . ticagrelor  90 mg Oral BID   Continuous Infusions: . sodium chloride    . sodium chloride     PRN Meds: sodium chloride, acetaminophen, ibuprofen, ondansetron (ZOFRAN) IV, oxyCODONE, sodium chloride flush   Vital Signs    Vitals:   12/22/18 0700 12/22/18 0800 12/22/18 0900 12/22/18 1000  BP: (!) 148/111 (!) 144/96 137/84 120/85  Pulse: 95 89 88 91  Resp: (!) 22 (!) '23 19 19  ' Temp:      TempSrc:      SpO2: 96% 95% 95% 97%  Weight:      Height:        Intake/Output Summary (Last 24 hours) at 12/22/2018 1125 Last data filed at 12/22/2018 0800 Gross per 24 hour  Intake 1526 ml  Output 800 ml  Net 726 ml   Last 3 Weights 12/21/2018 02/17/2014  Weight (lbs) 275 lb 261 lb  Weight (kg) 124.739 kg 118.389 kg      Telemetry    Sr at rate of 80s, new small brusts of NSVT - Personally Reviewed  ECG    SR  - Personally Reviewed  Physical Exam   GEN: No acute distress.   Neck: No JVD Cardiac: RRR, no murmurs, rubs, or gallops. Right radial cath site without hematoma. Respiratory: Clear to auscultation bilaterally. GI: Soft, nontender, non-distended  MS: No edema; No deformity. Neuro:  Nonfocal  Psych: Normal affect   Labs    Chemistry Recent Labs  Lab 12/21/18 1120 12/22/18 0558  NA 136 137  K 4.3 3.6  CL 105 107  CO2 16* 21*  GLUCOSE 116* 102*  BUN 13 8    CREATININE 1.39* 1.11  CALCIUM 9.3 8.4*  PROT 6.8 6.1*  ALBUMIN 4.4 3.8  AST 39 164*  ALT 52* 72*  ALKPHOS 82 79  BILITOT 1.0 1.7*  GFRNONAA >60 >60  GFRAA >60 >60  ANIONGAP 15 9     Hematology Recent Labs  Lab 12/21/18 1120 12/22/18 0558  WBC 8.7 9.5  RBC 6.12* 5.86*  HGB 17.3* 16.5  HCT 50.3 48.6  MCV 82.2 82.9  MCH 28.3 28.2  MCHC 34.4 34.0  RDW 11.9 12.2  PLT 213 213    Cardiac Enzymes Recent Labs  Lab 12/21/18 1120 12/21/18 1835 12/22/18 0006 12/22/18 0558  TROPONINI <0.03 >65.00* >65.00* 30.03*    Recent Labs  Lab 12/21/18 1132  TROPIPOC 0.02     BNP Recent Labs  Lab 12/21/18 1120  BNP 15.8     DDimer No results for input(s): DDIMER in the last 168 hours.   Radiology    No results found.  Cardiac Studies   Coronary/Graft Acute MI Revascularization  LEFT HEART CATH AND CORONARY ANGIOGRAPHY  Conclusion     A stent was successfully placed.  Anterior ST elevation myocardial infarction commencing at 10:45 AM  Eccentric 40% ostial left main  Total occlusion proximal to mid LAD treated with overlapping Onyx 3.0 mm stents deployed at 14 atm reducing 100% stenosis to 0% with TIMI grade III flow.  First diagonal contains 90% mid vessel stenosis.  The diagonal is moderate in size.  50% ostial to proximal circumflex.  The circumflex supplies a small territory.  RCA is large and contains diffuse luminal irregularities but no high-grade obstruction.  PDA contains ostial 60% narrowing  Apical severe hypokinesis with EF 40%.  LVEDP 28 mmHg.  RECOMMENDATIONS:   Dual antiplatelet therapy x12 months  Aggressive risk factor modification  Beta-blocker and ARB therapy  Clinical course will determine discharge but could potentially be discharged as early as 48 hours if no complications.      Patient Profile     48 y.o. male with history of hypertension and strong family history of CAD resented with anterior STEMI.  He  developed a chest discomfort and left arm radiation after doing [employer]Fit exercise training first time in 3 years.  EMS called found ST segment elevation.  Assessment & Plan    1. Anterior STEMI -Cath showed total occlusion of proximal to mid LAD status post PCI with overlapping DES. 60% circumflex, 40% ostial left main and 60% PDA.  Apical severe hypokinesis with EF of 40%.  LVEDP 28.  Troponin peaked at greater than 65. -Mild chest soreness which he attributes to extensive workout yesterday first time. -Mild dyspnea with deep breath but denies pain with bending or cough. -Get echocardiogram. Give IV lasix 47m x1.  Start spironolactone 12.529mdaily .  -Continue aspirin, Brilinta, Coreg and losartan.  2. HTN -Hypertensive initially now improved. - Will order PRN hydral  3.  Nonsustained VT - 8 beats maximum. K of 3.6. Supp K. Will check magnesium. Increase coreg to 6.2579mID.   4. HLD - 12/21/2018: Cholesterol 208; HDL 30; LDL Cholesterol 140; Triglycerides 192; VLDL 38  - On high intensity statin, will continue. Elevated LFTs likely due to acute MI.   5. ICM - LVEF of 40% by cath. Get echo. Lasix and spironolactone as above.   For questions or updates, please contact [facility] consult [email or web address] for contact info under       Signed, BhaLeanor KailA  12/22/2018, 11:25 AM    Patient seen and examined with the above-signed Advanced Practice Provider and/or Housestaff. I personally reviewed laboratory data, imaging studies and relevant notes. I independently examined the patient and formulated the important aspects of the plan. I have edited the note to reflect any of my changes or salient points. I have personally discussed the plan with the patient and/or family.  Hypertensive overnight with some chest soreness this am. ECG shows diffuse anterolateral and inferior q waves. Suspect LV recovery may be limited. Will get echo. Give one dose lasix. Start spiro.  Switch losartan to Entresto 24/26 bid. Will need CR. Continue DAPT.   DanGlori BickersD  12:11 PM

## 2018-12-23 ENCOUNTER — Encounter (HOSPITAL_COMMUNITY): Payer: Self-pay | Admitting: Interventional Cardiology

## 2018-12-23 ENCOUNTER — Inpatient Hospital Stay (HOSPITAL_COMMUNITY): Payer: 59

## 2018-12-23 DIAGNOSIS — R079 Chest pain, unspecified: Secondary | ICD-10-CM

## 2018-12-23 DIAGNOSIS — I255 Ischemic cardiomyopathy: Secondary | ICD-10-CM

## 2018-12-23 DIAGNOSIS — I10 Essential (primary) hypertension: Secondary | ICD-10-CM

## 2018-12-23 DIAGNOSIS — I5041 Acute combined systolic (congestive) and diastolic (congestive) heart failure: Secondary | ICD-10-CM

## 2018-12-23 LAB — COMPREHENSIVE METABOLIC PANEL
ALBUMIN: 3.9 g/dL (ref 3.5–5.0)
ALT: 55 U/L — ABNORMAL HIGH (ref 0–44)
AST: 72 U/L — ABNORMAL HIGH (ref 15–41)
Alkaline Phosphatase: 72 U/L (ref 38–126)
Anion gap: 9 (ref 5–15)
BUN: 9 mg/dL (ref 6–20)
CHLORIDE: 105 mmol/L (ref 98–111)
CO2: 24 mmol/L (ref 22–32)
Calcium: 8.5 mg/dL — ABNORMAL LOW (ref 8.9–10.3)
Creatinine, Ser: 1.2 mg/dL (ref 0.61–1.24)
GFR calc Af Amer: >60 mL/min (ref >60)
GFR calc non Af Amer: >60 mL/min (ref >60)
Glucose, Bld: 93 mg/dL (ref 70–99)
Potassium: 3.5 mmol/L (ref 3.5–5.1)
Sodium: 138 mmol/L (ref 135–145)
Total Bilirubin: 1.8 mg/dL — ABNORMAL HIGH (ref 0.3–1.2)
Total Protein: 6 g/dL — ABNORMAL LOW (ref 6.5–8.1)

## 2018-12-23 LAB — POCT I-STAT 7, (LYTES, BLD GAS, ICA,H+H)
Acid-base deficit: 5 mmol/L — ABNORMAL HIGH (ref 0.0–2.0)
Bicarbonate: 14.8 mmol/L — ABNORMAL LOW (ref 20.0–28.0)
Calcium, Ion: 1.01 mmol/L — ABNORMAL LOW (ref 1.15–1.40)
HCT: 44 % (ref 39.0–52.0)
Hemoglobin: 15 g/dL (ref 13.0–17.0)
O2 Saturation: 100 %
Potassium: 3.1 mmol/L — ABNORMAL LOW (ref 3.5–5.1)
SODIUM: 132 mmol/L — AB (ref 135–145)
TCO2: 15 mmol/L — ABNORMAL LOW (ref 22–32)
pCO2 arterial: 17 mmHg — CL (ref 32.0–48.0)
pH, Arterial: 7.547 — ABNORMAL HIGH (ref 7.350–7.450)
pO2, Arterial: 139 mmHg — ABNORMAL HIGH (ref 83.0–108.0)

## 2018-12-23 LAB — CBC
HCT: 48.8 % (ref 39.0–52.0)
Hemoglobin: 16.7 g/dL (ref 13.0–17.0)
MCH: 28.3 pg (ref 26.0–34.0)
MCHC: 34.2 g/dL (ref 30.0–36.0)
MCV: 82.6 fL (ref 80.0–100.0)
Platelets: 198 10*3/uL (ref 150–400)
RBC: 5.91 MIL/uL — ABNORMAL HIGH (ref 4.22–5.81)
RDW: 12 % (ref 11.5–15.5)
WBC: 8.4 10*3/uL (ref 4.0–10.5)
nRBC: 0 % (ref 0.0–0.2)

## 2018-12-23 LAB — ECHOCARDIOGRAM COMPLETE
Height: 74 in
Weight: 4400 oz

## 2018-12-23 LAB — POCT I-STAT CREATININE: Creatinine, Ser: 1 mg/dL (ref 0.61–1.24)

## 2018-12-23 MED ORDER — POTASSIUM CHLORIDE CRYS ER 20 MEQ PO TBCR
40.0000 meq | EXTENDED_RELEASE_TABLET | Freq: Once | ORAL | Status: AC
  Administered 2018-12-23: 40 meq via ORAL
  Filled 2018-12-23: qty 2

## 2018-12-23 MED FILL — Nitroglycerin IV Soln 100 MCG/ML in D5W: INTRA_ARTERIAL | Qty: 10 | Status: AC

## 2018-12-23 NOTE — Plan of Care (Signed)
  Problem: Education: Goal: Knowledge of General Education information will improve Description Including pain rating scale, medication(s)/side effects and non-pharmacologic comfort measures Outcome: Progressing   Problem: Health Behavior/Discharge Planning: Goal: Ability to manage health-related needs will improve Outcome: Progressing   Problem: Clinical Measurements: Goal: Ability to maintain clinical measurements within normal limits will improve Outcome: Progressing Goal: Will remain free from infection Outcome: Progressing Goal: Diagnostic test results will improve Outcome: Progressing Goal: Respiratory complications will improve Outcome: Progressing Goal: Cardiovascular complication will be avoided Outcome: Progressing   Problem: Nutrition: Goal: Adequate nutrition will be maintained Outcome: Progressing   Problem: Coping: Goal: Level of anxiety will decrease Outcome: Progressing   Problem: Elimination: Goal: Will not experience complications related to bowel motility Outcome: Progressing Goal: Will not experience complications related to urinary retention Outcome: Progressing   Problem: Pain Managment: Goal: General experience of comfort will improve Outcome: Progressing   Problem: Safety: Goal: Ability to remain free from injury will improve Outcome: Progressing   Problem: Skin Integrity: Goal: Risk for impaired skin integrity will decrease Outcome: Progressing   Problem: Activity: Goal: Ability to return to baseline activity level will improve Outcome: Progressing   Problem: Cardiovascular: Goal: Ability to achieve and maintain adequate cardiovascular perfusion will improve Outcome: Progressing Goal: Vascular access site(s) Level 0-1 will be maintained Outcome: Progressing   Problem: Health Behavior/Discharge Planning: Goal: Ability to safely manage health-related needs after discharge will improve Outcome: Progressing

## 2018-12-23 NOTE — Progress Notes (Signed)
CARDIAC REHAB PHASE I   PRE:  Rate/Rhythm: 89 SR  BP:  Supine:   Sitting: 117/91  Standing:    SaO2: 96%RA  MODE:  Ambulation: 800 ft   POST:  Rate/Rhythm: 92 SR  BP:  Supine:   Sitting: 121/90  Standing:    SaO2: 99%RA 1005-1107 Pt walked 800 ft on RA with steady gait and tolerated well. No CP. MI education completed with pt and wife. Stressed importance of brilinta with stent. Reviewed NTG use, MI restrictions, heart healthy food choices, ex ed, risk factors and CRP 2. Referred to [place] program. Pt on the road a lot with job and doubtful he will be able to attend. Encouraged to start ex slowly with walking.   Luetta Nutting, RN BSN  12/23/2018 11:03 AM

## 2018-12-23 NOTE — Progress Notes (Addendum)
Progress Note  Patient Name: Devin Wright Date of Encounter: 12/23/2018  Primary Cardiologist: Sinclair Grooms, MD   Subjective   Feeling well this morning. No chest pain, just sore all over. Worked out pretty intense the day of admission.   Inpatient Medications    Scheduled Meds: . aspirin  81 mg Oral Daily  . atorvastatin  80 mg Oral q1800  . carvedilol  3.125 mg Oral BID WC  . heparin  5,000 Units Subcutaneous Q8H  . pantoprazole  40 mg Oral Daily  . sacubitril-valsartan  1 tablet Oral BID  . sodium chloride flush  3 mL Intravenous Q12H  . spironolactone  12.5 mg Oral Daily  . ticagrelor  90 mg Oral BID   Continuous Infusions: . sodium chloride    . sodium chloride     PRN Meds: sodium chloride, acetaminophen, hydrALAZINE, ondansetron (ZOFRAN) IV, oxyCODONE, sodium chloride flush   Vital Signs    Vitals:   12/23/18 0319 12/23/18 0400 12/23/18 0742 12/23/18 0819  BP: 104/74  105/73 109/82  Pulse: 82  92 (!) 101  Resp: (!) 21 (!) 26 20   Temp: 98.3 F (36.8 C)  (!) 97.5 F (36.4 C)   TempSrc: Oral  Oral   SpO2: 92%  95%   Weight:      Height:        Intake/Output Summary (Last 24 hours) at 12/23/2018 1204 Last data filed at 12/23/2018 0742 Gross per 24 hour  Intake 363 ml  Output 1650 ml  Net -1287 ml   Last 3 Weights 12/21/2018 02/17/2014  Weight (lbs) 275 lb 261 lb  Weight (kg) 124.739 kg 118.389 kg      Telemetry    SR - Personally Reviewed  ECG    SR with evolving ST changing in the anterior leads- Personally Reviewed  Physical Exam   GEN: No acute distress.   Neck: No JVD Cardiac: RRR, no murmurs, rubs, or gallops.  Respiratory: Clear to auscultation bilaterally. GI: Soft, nontender, non-distended  MS: No edema; No deformity. Neuro:  Nonfocal  Psych: Normal affect   Labs    Chemistry Recent Labs  Lab 12/21/18 1120 12/21/18 1219 12/21/18 1230 12/22/18 0558 12/23/18 0312  NA 136  --  132* 137 138  K 4.3  --  3.1* 3.6 3.5    CL 105  --   --  107 105  CO2 16*  --   --  21* 24  GLUCOSE 116*  --   --  102* 93  BUN 13  --   --  8 9  CREATININE 1.39* 1.00  --  1.11 1.20  CALCIUM 9.3  --   --  8.4* 8.5*  PROT 6.8  --   --  6.1* 6.0*  ALBUMIN 4.4  --   --  3.8 3.9  AST 39  --   --  164* 72*  ALT 52*  --   --  72* 55*  ALKPHOS 82  --   --  79 72  BILITOT 1.0  --   --  1.7* 1.8*  GFRNONAA >60  --   --  >60 >60  GFRAA >60  --   --  >60 >60  ANIONGAP 15  --   --  9 9     Hematology Recent Labs  Lab 12/21/18 1120 12/21/18 1230 12/22/18 0558 12/23/18 0312  WBC 8.7  --  9.5 8.4  RBC 6.12*  --  5.86* 5.91*  HGB  17.3* 15.0 16.5 16.7  HCT 50.3 44.0 48.6 48.8  MCV 82.2  --  82.9 82.6  MCH 28.3  --  28.2 28.3  MCHC 34.4  --  34.0 34.2  RDW 11.9  --  12.2 12.0  PLT 213  --  213 198    Cardiac Enzymes Recent Labs  Lab 12/21/18 1120 12/21/18 1835 12/22/18 0006 12/22/18 0558  TROPONINI <0.03 >65.00* >65.00* 30.03*    Recent Labs  Lab 12/21/18 1132  TROPIPOC 0.02     BNP Recent Labs  Lab 12/21/18 1120  BNP 15.8     DDimer No results for input(s): DDIMER in the last 168 hours.   Radiology    No results found.  Cardiac Studies   Cath: 12/21/2018   Anterior ST elevation myocardial infarction commencing at 10:45 AM  Eccentric 40% ostial left main  Total occlusion proximal to mid LAD treated with overlapping Onyx 3.0 mm stents deployed at 14 atm reducing 100% stenosis to 0% with TIMI grade III flow.  First diagonal contains 90% mid vessel stenosis.  The diagonal is moderate in size.  50% ostial to proximal circumflex.  The circumflex supplies a small territory.  RCA is large and contains diffuse luminal irregularities but no high-grade obstruction.  PDA contains ostial 60% narrowing  Apical severe hypokinesis with EF 40%.  LVEDP 28 mmHg.  RECOMMENDATIONS:   Dual antiplatelet therapy x12 months  Aggressive risk factor modification  Beta-blocker and ARB therapy  Clinical  course will determine discharge but could potentially be discharged as early as 48 hours if no complications.  Patient Profile     48 y.o. male with PMH of HTNm and family hx of CAD who presented with anterior STEMI.  Assessment & Plan    Principal Problem:   Acute ST elevation myocardial infarction (STEMI) involving left anterior descending (LAD) coronary artery (HCC) Active Problems:   Ischemic cardiomyopathy following anterior STEMI   Acute combined systolic and diastolic heart failure ([place])   Essential hypertension   Hyperlipidemia LDL goal <70  1. Anterior STEMI, CAD-PCI: Cath showed total occlusion of proximal to mid LAD status post PCI with overlapping DES. 60% circumflex, 40% ostial left main and 60% PDA.  Apical severe hypokinesis with EF of 40%.  LVEDP 28.  Troponin peaked at greater than 65. -Mild chest soreness which he feels is related to his work out. Needs to work with CR today. -Echo pending -Continue DAPT with aspirin, Brilinta, along with Coreg, Entresto and spiro  2. HTN - stable  3.  Nonsustained VT - likely reperfusion, now resolved - supplement K today -On carvedilol.  4. HLD - 12/21/2018: Cholesterol 208; HDL 30; LDL Cholesterol 140; Triglycerides 192; VLDL 38  - On high intensity statin, will continue. Elevated LFTs likely due to acute MI, trending down   5. ICM - ischemic cardiomyopathy with acute combined systolic and diastolic heart failure - LVEF of 40% by cath. Echo pending - on BB, entresto (however if EF is truly > 40 %, would probably not qualify for Entresto) and spiro -No diuretic regimen   Signed, Reino Bellis, NP-C   ATTENDING ATTESTATION  I have seen, examined and evaluated the patient this AM along with Reino Bellis, NP-C.  After reviewing all the available data and chart, we discussed the patients laboratory, study & physical findings as well as symptoms in detail. I agree with her findings, examination as well as impression  recommendations as per our discussion.    Attending adjustments noted  in italics.   Recovering well from anterior MI, no further chest pain.  On stable regimen, however with heart rate in the 90s to 100s, can probably titrate up carvedilol if blood pressure tolerates.  We will need to see what his echo looks like a source EF to determine whether he truly can be on Entresto or not.  If not, would switch to ARB.  Relatively euvolemic on exam.  Probably okay to transfer to telemetry and expected discharge tomorrow if stable.   Glenetta Hew, M.D., M.S. Interventional Cardiologist   Pager # [phone] Phone # [phone] [address] [place], [place] [address]  For questions or updates, please contact [place] Please consult [email or web address] for contact info under

## 2018-12-23 NOTE — Progress Notes (Signed)
Patient C/O headahce and asked for Tylenol. He stated his pain level was a 4/10 and he requested Tylenol. 650 mg given PO to patient and I will evaluate in an hour to see if patient is pain free.

## 2018-12-23 NOTE — Care Management (Signed)
#    12.   S/W  LATOYA    @ [facility] # [phone] OPT- MEMBER   1. BRILINTA   90 MG BID COVER- YES CO-PAY- $ 30.00 TIER- NO PRIOR APPROVAL- NO 90 DAY SUPPLY FOR RETAIL OR M/O $ 75.00   2. ENTRESTO  24 / 26 MG BID COVER- YES CO-PAY $ 30.00 TIER- NO PRIOR   APPROVAL- NO 90 DAY SUPPLY FOR RETAIL OR M/O $ 75.00  PREFERRED PHARMACY ; YES [facility]

## 2018-12-23 NOTE — Care Management Note (Signed)
Case Management Note  Patient Details  Name: Dolton R [place] MRN: [id number] Date of Birth: 01/30/1971  Subjective/Objective:    Pt admitted with STEMI               Action/Plan:   PTA independent from home.  Pt does not have medicare nor medicaid - CM provided both free 30 day cards and reduced copay cards for both Entresto and Brilinta.  CM also submitted benefit check   Expected Discharge Date:                  Expected Discharge Plan:  Home/Self Care  In-House Referral:     Discharge planning Services  CM Consult, Medication Assistance  Post Acute Care Choice:    Choice offered to:     DME Arranged:    DME Agency:     HH Arranged:    HH Agency:     Status of Service:  In process, will continue to follow  If discussed at Long Length of Stay Meetings, dates discussed:    Additional Comments:  Cherylann Parr, RN 12/23/2018, 3:13 PM

## 2018-12-23 NOTE — Progress Notes (Signed)
  Echocardiogram 2D Echocardiogram has been performed.  Devin Wright 12/23/2018, 12:38 PM

## 2018-12-24 ENCOUNTER — Encounter (HOSPITAL_COMMUNITY): Payer: Self-pay | Admitting: Cardiology

## 2018-12-24 MED ORDER — NITROGLYCERIN 0.4 MG SL SUBL: 0.4000 mg | SUBLINGUAL_TABLET | SUBLINGUAL | 0 refills | Status: DC | PRN

## 2018-12-24 MED ORDER — SPIRONOLACTONE 25 MG PO TABS: 12.5000 mg | ORAL_TABLET | Freq: Every day | ORAL | 0 refills | Status: DC

## 2018-12-24 MED ORDER — LOSARTAN POTASSIUM 50 MG PO TABS: 50.0000 mg | ORAL_TABLET | Freq: Every day | ORAL | 0 refills | Status: DC

## 2018-12-24 MED ORDER — ASPIRIN 81 MG PO CHEW: 81.0000 mg | CHEWABLE_TABLET | Freq: Every day | ORAL | 0 refills | Status: DC

## 2018-12-24 MED ORDER — CARVEDILOL 3.125 MG PO TABS: 3.1250 mg | ORAL_TABLET | Freq: Two times a day (BID) | ORAL | 0 refills | Status: DC

## 2018-12-24 MED ORDER — NITROGLYCERIN 0.4 MG SL SUBL: 0.4000 mg | SUBLINGUAL_TABLET | SUBLINGUAL | Status: DC | PRN

## 2018-12-24 MED ORDER — LOSARTAN POTASSIUM 50 MG PO TABS
50.0000 mg | ORAL_TABLET | Freq: Every day | ORAL | Status: DC
  Administered 2018-12-24: 50 mg via ORAL
  Filled 2018-12-24: qty 1

## 2018-12-24 MED ORDER — TICAGRELOR 90 MG PO TABS: 90.0000 mg | ORAL_TABLET | Freq: Two times a day (BID) | ORAL | 0 refills | Status: DC

## 2018-12-24 MED ORDER — ATORVASTATIN CALCIUM 80 MG PO TABS: 80.0000 mg | ORAL_TABLET | Freq: Every day | ORAL | 0 refills | Status: DC

## 2018-12-24 MED FILL — ATORVASTATIN CALCIUM 80 MG: 80 | 30 days supply | Qty: 30 | Fill #0

## 2018-12-24 MED FILL — NITROGLYCERIN 0.4 MG TAB SL: 0.4 | 8 days supply | Qty: 25 | Fill #0

## 2018-12-24 MED FILL — LOSARTAN POTASSIUM 50 MG TA: 50 | 30 days supply | Qty: 30 | Fill #0

## 2018-12-24 MED FILL — ASPIRIN LOW DOSE 81 MG CHEW: 81 | 30 days supply | Qty: 30 | Fill #0

## 2018-12-24 MED FILL — BRILINTA 90 MG TABLET: 90 | 30 days supply | Qty: 60 | Fill #0

## 2018-12-24 MED FILL — SPIRONOLACTONE 25 MG TABLET: 25 | 30 days supply | Qty: 15 | Fill #0 | Status: TO

## 2018-12-24 MED FILL — CARVEDILOL 3.125 MG TABLET: 3.125 | 30 days supply | Qty: 60 | Fill #0

## 2018-12-24 NOTE — Discharge Summary (Addendum)
Discharge Summary    Patient ID: Devin Wright,  MRN: [id number], DOB/AGE: [birth year] 48 y.o.  Admit date: 12/21/2018 Discharge date: 12/24/2018  Primary Care Provider: Deland Pretty Primary Cardiologist: Dr. Tamala Julian  Discharge Diagnoses    Principal Problem:   Acute ST elevation myocardial infarction (STEMI) involving left anterior descending (LAD) coronary artery [facility]) Active Problems:   Ischemic cardiomyopathy following anterior STEMI   Acute combined systolic and diastolic heart failure ([place])   Essential hypertension   Hyperlipidemia LDL goal <70   Allergies No Known Allergies  Diagnostic Studies/Procedures    Cath: 12/21/2018   Anterior ST elevation myocardial infarction commencing at 10:45 AM  Eccentric 40% ostial left main  Total occlusion proximal to mid LAD treated with overlapping Onyx 3.0 mm stents deployed at 14 atm reducing 100% stenosis to 0% with TIMI grade III flow.  First diagonal contains 90% mid vessel stenosis.  The diagonal is moderate in size.  50% ostial to proximal circumflex.  The circumflex supplies a small territory.  RCA is large and contains diffuse luminal irregularities but no high-grade obstruction.  PDA contains ostial 60% narrowing  Apical severe hypokinesis with EF 40%.  LVEDP 28 mmHg.  RECOMMENDATIONS:   Dual antiplatelet therapy x12 months  Aggressive risk factor modification  Beta-blocker and ARB therapy  Clinical course will determine discharge but could potentially be discharged as early as 48 hours if no complications.  TTE: 12/23/2018  IMPRESSIONS    1. The left ventricle has normal systolic function with an ejection fraction of 40-45%. The cavity size was normal. There is moderately increased left ventricular wall thickness. There is severe hypokinesis to akinesis of the mid inferoseptal and  anteroseptal walls. There is severe hypokinesis to akinesis of the apical septal, inferior, anterior and inferolateral  walls.  2. The right ventricle has normal systolic function. The cavity was normal. There is no increase in right ventricular wall thickness.  3. The tricuspid valve is normal in structure.  4. The aortic valve is tricuspid.  5. The pulmonic valve was normal in structure.  6. Trivial pericardial effusion is present.  7. The pericardial effusion is anterior to the right ventricle.  8. When compared to the prior study: Compared to echo 2015, LV function has declined and there are new wall motion abnormalities.  9. The mitral valve is normal in structure.  _____________   History of Present Illness     Devin Wright is a 48 yo male who presented with chest pain after his first [employer] exercise training in 3 years. He developed chest discomfort and arm discomfort while walking to his car.  EMS was summoned and EKG demonstrated widespread ST segment elevation.  No prior  significant history.  Family history is significant including his father and a sister with CAD.  He is not diabetic.  He does not smoke.  He was brought to the cath lab emergently for cardiac catheterization.  Hospital Course      1. Anterior STEMI, CAD-PCI: 100% p-mLAD -- s/p PCI with overlapping DES. 60% circumflex, 40% ostial left main and 60% PDA. Apical severe hypokinesis with EF of 40%. LVEDP 28. Troponin peaked at greater than 65. Worked well with cardiac rehab without recurrent chest pain. -Echo confirmed EF 40-45%.  -DAPT - ASA/Brilinta -Coreg & Losartan; anticipate stopping spironolactone as OP to allow up-titration of Coreg. Initially placed on Entresto but EF noted at 45%.  -High-dose statin  2. HTN - stable  3.Nonsustained VT - likely  reperfusion, now resolved - supplemented K+ -On carvedilol.  4. HLD2/29/2020: Cholesterol 208; HDL 30; LDL Cholesterol 140; Triglycerides 192; VLDL 38 - LFTs trending down post MI - High dose statin  5. ICM - ischemic cardiomyopathy with acute combined systolic and  diastolic heart failure - LVEF of 40% by cath. Echo suggests 40-45% with LAD distribution WMA - on BB, & ARB - continue spironolactone for now, but may d/c as OP & increase Coreg.  Devin Wright was seen by Dr. Ellyn Hack and determined stable for discharge home. Follow up in the office has been arranged. Medications are listed below.   _____________  Discharge Vitals Blood pressure 117/89, pulse 93, temperature 98.2 F (36.8 C), temperature source Oral, resp. rate 16, height '6\' 2"'  (1.88 m), weight 124.7 kg, SpO2 97 %.  Filed Weights   12/21/18 1127  Weight: 124.7 kg    Labs & Radiologic Studies    CBC Recent Labs    12/22/18 0558 12/23/18 0312  WBC 9.5 8.4  HGB 16.5 16.7  HCT 48.6 48.8  MCV 82.9 82.6  PLT 213 638   Basic Metabolic Panel Recent Labs    12/22/18 0558 12/22/18 1252 12/23/18 0312  NA 137  --  138  K 3.6  --  3.5  CL 107  --  105  CO2 21*  --  24  GLUCOSE 102*  --  93  BUN 8  --  9  CREATININE 1.11  --  1.20  CALCIUM 8.4*  --  8.5*  MG  --  2.2  --    Liver Function Tests Recent Labs    12/22/18 0558 12/23/18 0312  AST 164* 72*  ALT 72* 55*  ALKPHOS 79 72  BILITOT 1.7* 1.8*  PROT 6.1* 6.0*  ALBUMIN 3.8 3.9   No results for input(s): LIPASE, AMYLASE in the last 72 hours. Cardiac Enzymes Recent Labs    12/22/18 0006 12/22/18 0558  TROPONINI >65.00* 30.03*   BNP Invalid input(s): POCBNP D-Dimer No results for input(s): DDIMER in the last 72 hours. Hemoglobin A1C Recent Labs    12/22/18 0558  HGBA1C 4.8   Fasting Lipid Panel No results for input(s): CHOL, HDL, LDLCALC, TRIG, CHOLHDL, LDLDIRECT in the last 72 hours. Thyroid Function Tests No results for input(s): TSH, T4TOTAL, T3FREE, THYROIDAB in the last 72 hours.  Invalid input(s): FREET3 _____________  No results found. Disposition   Pt is being discharged home today in good condition.  Follow-up Plans & Appointments    Follow-up Information    Burtis Junes, NP  Follow up on 01/06/2019.   Specialties:  Nurse Practitioner, Interventional Cardiology, Cardiology, Radiology Why:  at 2pm for your follow up appt.  Contact information: [place] [place] [address] [phone]          Discharge Instructions    Amb Referral to Cardiac Rehabilitation   Complete by:  As directed    Diagnosis:   STEMI Coronary Stents     Diet - low sodium heart healthy   Complete by:  As directed    Discharge instructions   Complete by:  As directed    Radial Site Care Refer to this sheet in the next few weeks. These instructions provide you with information on caring for yourself after your procedure. Your caregiver may also give you more specific instructions. Your treatment has been planned according to current medical practices, but problems sometimes occur. Call your caregiver if you have any problems or  questions after your procedure. HOME CARE INSTRUCTIONS You may shower the day after the procedure.Remove the bandage (dressing) and gently wash the site with plain soap and water.Gently pat the site dry.  Do not apply powder or lotion to the site.  Do not submerge the affected site in water for 3 to 5 days.  Inspect the site at least twice daily.  Do not flex or bend the affected arm for 24 hours.  No lifting over 5 pounds (2.3 kg) for 5 days after your procedure.  Do not drive home if you are discharged the same day of the procedure. Have someone else drive you.  You may drive 24 hours after the procedure unless otherwise instructed by your caregiver.  What to expect: Any bruising will usually fade within 1 to 2 weeks.  Blood that collects in the tissue (hematoma) may be painful to the touch. It should usually decrease in size and tenderness within 1 to 2 weeks.  SEEK IMMEDIATE MEDICAL CARE IF: You have unusual pain at the radial site.  You have redness, warmth, swelling, or pain at the radial site.  You have drainage (other than a  small amount of blood on the dressing).  You have chills.  You have a fever or persistent symptoms for more than 72 hours.  You have a fever and your symptoms suddenly get worse.  Your arm becomes pale, cool, tingly, or numb.  You have heavy bleeding from the site. Hold pressure on the site.   PLEASE DO NOT MISS ANY DOSES OF YOUR BRILINTA!!!!! Also keep a log of you blood pressures and bring back to your follow up appt. Please call the office with any questions.   Patients taking blood thinners should generally stay away from medicines like ibuprofen, Advil, Motrin, naproxen, and Aleve due to risk of stomach bleeding. You may take Tylenol as directed or talk to your primary doctor about alternatives.   Increase activity slowly   Complete by:  As directed        Discharge Medications     Medication List    STOP taking these medications   ibuprofen 200 MG tablet Commonly known as:  ADVIL,MOTRIN     TAKE these medications   aspirin 81 MG chewable tablet Chew 1 tablet (81 mg total) by mouth daily. Start taking on:  December 25, 2018   atorvastatin 80 MG tablet Commonly known as:  LIPITOR Take 1 tablet (80 mg total) by mouth daily at 6 PM.   carvedilol 3.125 MG tablet Commonly known as:  COREG Take 1 tablet (3.125 mg total) by mouth 2 (two) times daily with a meal.   losartan 50 MG tablet Commonly known as:  COZAAR Take 1 tablet (50 mg total) by mouth daily. Start taking on:  December 25, 2018   nitroGLYCERIN 0.4 MG SL tablet Commonly known as:  NITROSTAT Place 1 tablet (0.4 mg total) under the tongue every 5 (five) minutes as needed for chest pain.   spironolactone 25 MG tablet Commonly known as:  ALDACTONE Take 0.5 tablets (12.5 mg total) by mouth daily. Start taking on:  December 25, 2018   ticagrelor 90 MG Tabs tablet Commonly known as:  BRILINTA Take 1 tablet (90 mg total) by mouth 2 (two) times daily.       Acute coronary syndrome (MI, NSTEMI, STEMI, etc) this  admission?: Yes.     AHA/ACC Clinical Performance & Quality Measures: 1. Aspirin prescribed? - Yes 2. ADP Receptor Inhibitor (Plavix/Clopidogrel, Brilinta/Ticagrelor  or Effient/Prasugrel) prescribed (includes medically managed patients)? - Yes 3. Beta Blocker prescribed? - Yes 4. High Intensity Statin (Lipitor 40-48m or Crestor 20-420m prescribed? - Yes 5. EF assessed during THIS hospitalization? - Yes 6. For EF <40%, was ACEI/ARB prescribed? - Yes 7. For EF <40%, Aldosterone Antagonist (Spironolactone or Eplerenone) prescribed? - Yes 8. Cardiac Rehab Phase II ordered (Included Medically managed Patients)? - Yes   Outstanding Labs/Studies   FLP/LFTs in 6 weeks. BMET at follow up  Duration of Discharge Encounter   Greater than 30 minutes including physician time.  Signed, LiReino BellisP-C 12/24/2018, 10:57 PM    Patient was seen and evaluated this morning along with LiReino BellisNP.  See final progress note for details.  Look good today.  Reviewed results of echocardiogram showing EF greater than 40%, therefore converted back to ARB from [employer] Would continue spironolactone for at least the first month or so and then allow Dr. SmTamala Juliano reassess in the outpatient setting whether not this could be discontinued in lieu of titrating up carvedilol.   DaGlenetta HewMD

## 2018-12-24 NOTE — Progress Notes (Signed)
[id number] Checked on pt to see if any questions re ed yesterday. Pt stated he might be able to do Mon and Fridays with CRP 2. Referring to [facility] program. Encouraged pt to weigh daily and watch sodium. Encouraged him to call MD if he gains 3 pounds overnight or 5 in week and to watch for signs of fluid retention. Luetta Nutting RN BSN 12/24/2018 11:35 AM

## 2018-12-24 NOTE — Progress Notes (Signed)
Progress Note  Patient Name: Esaul R [place] Date of Encounter: 12/24/2018  Primary Cardiologist: Sinclair Grooms, MD   Subjective   Feels well this morning.  Still little sore from his workout, but no anginal pain.  Had a little bit of a cough this morning but otherwise stable.  Has done several 5-minute walks with no angina..  Hopes to go home.  Inpatient Medications    Scheduled Meds: . aspirin  81 mg Oral Daily  . atorvastatin  80 mg Oral q1800  . carvedilol  3.125 mg Oral BID WC  . heparin  5,000 Units Subcutaneous Q8H  . losartan  50 mg Oral Daily  . pantoprazole  40 mg Oral Daily  . sodium chloride flush  3 mL Intravenous Q12H  . spironolactone  12.5 mg Oral Daily  . ticagrelor  90 mg Oral BID   Continuous Infusions: . sodium chloride    . sodium chloride     PRN Meds: sodium chloride, acetaminophen, hydrALAZINE, ondansetron (ZOFRAN) IV, oxyCODONE, sodium chloride flush   Vital Signs    Vitals:   12/23/18 2027 12/24/18 0007 12/24/18 0355 12/24/18 0811  BP: 102/69 109/80 112/84 117/89  Pulse:    93  Resp: '12 15 15 16  ' Temp: 98.7 F (37.1 C) 98.2 F (36.8 C) 98.2 F (36.8 C) 98.2 F (36.8 C)  TempSrc: Oral Oral  Oral  SpO2: 97%   97%  Weight:      Height:        Intake/Output Summary (Last 24 hours) at 12/24/2018 0908 Last data filed at 12/23/2018 2137 Gross per 24 hour  Intake 3 ml  Output 600 ml  Net -597 ml   Last 3 Weights 12/21/2018 02/17/2014  Weight (lbs) 275 lb 261 lb  Weight (kg) 124.739 kg 118.389 kg      Telemetry    SR rates 80s to low 100- Personally Reviewed  ECG    Not checked  Physical Exam   GEN: No acute distress.  Resting in bed comfortably. Neck: No JVD or carotid bruit. Cardiac:  RRR, normal S1 and S2.  No M/R/G. Respiratory:  CTA B, nonlabored, good air movement. GI:  Soft/NT/ND .  No HSM. MS:  No C/C/E. Neuro:   Nonfocal.  Awake alert x3. Psych: Normal mood and affect.  Labs    Chemistry Recent Labs  Lab  12/21/18 1120 12/21/18 1219 12/21/18 1230 12/22/18 0558 12/23/18 0312  NA 136  --  132* 137 138  K 4.3  --  3.1* 3.6 3.5  CL 105  --   --  107 105  CO2 16*  --   --  21* 24  GLUCOSE 116*  --   --  102* 93  BUN 13  --   --  8 9  CREATININE 1.39* 1.00  --  1.11 1.20  CALCIUM 9.3  --   --  8.4* 8.5*  PROT 6.8  --   --  6.1* 6.0*  ALBUMIN 4.4  --   --  3.8 3.9  AST 39  --   --  164* 72*  ALT 52*  --   --  72* 55*  ALKPHOS 82  --   --  79 72  BILITOT 1.0  --   --  1.7* 1.8*  GFRNONAA >60  --   --  >60 >60  GFRAA >60  --   --  >60 >60  ANIONGAP 15  --   --  9 9  Hematology Recent Labs  Lab 12/21/18 1120 12/21/18 1230 12/22/18 0558 12/23/18 0312  WBC 8.7  --  9.5 8.4  RBC 6.12*  --  5.86* 5.91*  HGB 17.3* 15.0 16.5 16.7  HCT 50.3 44.0 48.6 48.8  MCV 82.2  --  82.9 82.6  MCH 28.3  --  28.2 28.3  MCHC 34.4  --  34.0 34.2  RDW 11.9  --  12.2 12.0  PLT 213  --  213 198    Cardiac Enzymes Recent Labs  Lab 12/21/18 1120 12/21/18 1835 12/22/18 0006 12/22/18 0558  TROPONINI <0.03 >65.00* >65.00* 30.03*    Recent Labs  Lab 12/21/18 1132  TROPIPOC 0.02     BNP Recent Labs  Lab 12/21/18 1120  BNP 15.8     DDimer No results for input(s): DDIMER in the last 168 hours.   Radiology    No results found.  Cardiac Studies   Cath: 12/21/2018: Anterior ST elevation myocardial infarction commencing at 10:45 AM  Eccentric 40% ostial left main  Total occlusion proximal to mid LAD treated with overlapping Onyx 3.0 mm stents deployed at 14 atm reducing 100% stenosis to 0% with TIMI grade III flow.  First diagonal contains 90% mid vessel stenosis.  The diagonal is moderate in size.  50% ostial to proximal circumflex.  The circumflex supplies a small territory.  RCA is large and contains diffuse luminal irregularities but no high-grade obstruction.  PDA contains ostial 60% narrowing  Apical severe hypokinesis with EF 40%.  LVEDP 28  mmHg.  RECOMMENDATIONS:   Dual antiplatelet therapy x12 months  Aggressive risk factor modification  Beta-blocker and ARB therapy  Clinical course will determine discharge but could potentially be discharged as early as 48 hours if no complications.         Echo 12/23/2018: EF 40 to 45%.  Severe HK AK at the mid inferoseptal and anteroseptal walls.  Severe HK to HK of the apical septal inferior anterior and inferolateral walls. -Consistent with LAD MI.  Patient Profile     48 y.o. male with PMH of HTNm and family hx of CAD who presented with anterior STEMI.  Assessment & Plan    Principal Problem:   Acute ST elevation myocardial infarction (STEMI) involving left anterior descending (LAD) coronary artery ([facility]) Active Problems:   Ischemic cardiomyopathy following anterior STEMI   Acute combined systolic and diastolic heart failure ([place])   Essential hypertension   Hyperlipidemia LDL goal <70  1. Anterior STEMI, CAD-PCI: 100% p-mLAD -- s/p PCI with overlapping DES. 60% circumflex, 40% ostial left main and 60% PDA.  Apical severe hypokinesis with EF of 40%.  LVEDP 28.  Troponin peaked at greater than 65. -Echo confirms EF 40-45%.  -DAPT - ASA/Brilinta - Coreg & now Losartan; anticipate stopping spironolactone as OP to allow up-titration of Coreg. -High-dose statin  2. HTN - stable; converted to Losartan from Entresto (EF > 40)  3.  Nonsustained VT - likely reperfusion, now resolved - supplement K+ -On carvedilol.  4. HLD 12/21/2018: Cholesterol 208; HDL 30; LDL Cholesterol 140; Triglycerides 192; VLDL 38  - LFTs trending down post MI - High dose statin   5. ICM - ischemic cardiomyopathy with acute combined systolic and diastolic heart failure - LVEF of 40% by cath. Echo suggests 40-45% with LAD distribution WMA - on BB, & ARB - continue spironolactone for now, but may d/c as OP & increase Coreg. -No diuretic regimen   Recovering well from anterior MI, no  further  further symptoms of angina or heart failure.   Relatively euvolemic on exam.   Blood pressure and heart rate stable -anticipate increasing carvedilol dose as an outpatient.  Would not do it now just to avoid concerns of fatigue from beta-blocker post MI.    Recommend cardiac rehab as outpatient  Okay for discharge.  We will arrange outpatient follow-up -> out of work until seen in follow-up.   Glenetta Hew, M.D., M.S. Interventional Cardiologist   Pager # [phone] Phone # [phone] [address] [place], [place] [address]  For questions or updates, please contact [place] Please consult [email or web address] for contact info under

## 2018-12-24 NOTE — Plan of Care (Signed)
Patient has received all education re: cath site, new medications, cath discharge instructions.  All questions answered w/stated verbal understanding.

## 2018-12-26 ENCOUNTER — Telehealth (HOSPITAL_COMMUNITY): Payer: Self-pay

## 2018-12-26 NOTE — Telephone Encounter (Signed)
Pt insurance is active and benefits verified through [employer] Co-pay 0, DED $600/$343.90 met, out of pocket $3,500/$367.94 met, co-insurance 20%. no pre-authorization required. Passport, 12/26/2018 @ 4:12pm, REF# [id number]  Will contact patient to see if he is interested in the Cardiac Rehab Program. If interested, patient will need to complete follow up appt. Once completed, patient will be contacted for scheduling upon review by the RN Navigator. Tedra [place]. Support Rep II

## 2019-01-02 NOTE — Progress Notes (Signed)
Transitions of Care Follow Up Call Note  Devin Wright is an 48 y.o. male who presented to [facility] on 12/21/2018.  The patient had the following prescriptions filled at [facility]: Spironolactone  Patient unable to be reached after calling three times and prescriptions filled at the [facility] Trans[facility]acy were transferred to preferred pharmacy found within their chart [facility] in [place]).   [name] A Amauris Debois 01/02/2019, 6:20 PM Transitions of Care Pharmacy Hours: Monday - Friday 8:30am to 5:00 PM  Phone - [phone]

## 2019-01-06 ENCOUNTER — Other Ambulatory Visit: Payer: Self-pay

## 2019-01-06 ENCOUNTER — Encounter: Payer: Self-pay | Admitting: Nurse Practitioner

## 2019-01-06 ENCOUNTER — Ambulatory Visit: Payer: 59 | Admitting: Nurse Practitioner

## 2019-01-06 VITALS — BP 102/68 | HR 82 | Ht 74.0 in | Wt 279.0 lb

## 2019-01-06 DIAGNOSIS — I255 Ischemic cardiomyopathy: Secondary | ICD-10-CM

## 2019-01-06 DIAGNOSIS — Z8249 Family history of ischemic heart disease and other diseases of the circulatory system: Secondary | ICD-10-CM | POA: Diagnosis not present

## 2019-01-06 DIAGNOSIS — E7849 Other hyperlipidemia: Secondary | ICD-10-CM | POA: Diagnosis not present

## 2019-01-06 MED ORDER — CARVEDILOL 6.25 MG PO TABS: 6.2500 mg | ORAL_TABLET | Freq: Two times a day (BID) | ORAL | 6 refills | Status: DC

## 2019-01-06 NOTE — Progress Notes (Signed)
CARDIOLOGY OFFICE NOTE  Date:  01/06/2019    Devin Wright Date of Birth: Aug 20, 1971 Medical Record #[id number]  PCP:  Deland Pretty, MD  Cardiologist:  Jennings Books    Chief Complaint  Patient presents with  . Coronary Artery Disease    Post hospital visit - seen for Dr. Tamala Julian    History of Present Illness: Devin Wright is a 48 y.o. male who presents today for a post hospital visit. Seen for Dr. Tamala Julian.   He has had no prior cardiac history.   He presented at the end of February with chest pain after his first [employer] exercise trainingin 3 years. He developed chest discomfort and arm discomfort while walking to his car. EMS was summoned and EKG demonstrated widespread ST segment elevation. Family history is significant for CAD including his father and a sister with CAD. He is not diabetic. He does not smoke.  He was brought to the cath lab emergently for cardiac catheterization.Found to have 100% LAD - s/p PCI with overlapping DES. Has residual disease. Had severe apical hypokinesis with EF of 40%. Echo confirmed EF of 40 to 45%. On DAPT. Started on good CHF regimen - noted to try and increase Coreg (stop Aldactone).   Patient and wife screened for recent travel, fever, URI symptoms and shortness of breath. Patient and wife deny travel over the last 14 days and are currently without symptoms.    Comes in today. Here with his wife - she is my eye doctor. Has been home almost a week. [name] has done well. No recurrent chest tightness - he has had some sharp twinges but nothing like his presenting symptoms. Walking some - was told he could only walk about 5 minutes. He is interested in getting carotids/AAA checked - his father died with an AAA dissection. He is tolerating his medicines - not really dizzy at all - BP is actually a little higher at home. Seems ok on his medicines. He seems quite motivated to make life style changes.   Past Medical History:  Diagnosis  Date  . Acute systolic (congestive) heart failure ([place])   . Family history of heart disease   . HTN (hypertension)   . Hyperlipidemia   . Palpitations   . STEMI (ST elevation myocardial infarction) ([place])    PCI/DES to p/mLAD, 60% lcx, 40% ostial LM, and 60% PDA, EF 45%    Past Surgical History:  Procedure Laterality Date  . CORONARY/GRAFT ACUTE MI REVASCULARIZATION N/A 12/21/2018   Procedure: Coronary/Graft Acute MI Revascularization;  Surgeon: Belva Crome, MD;  Location: [facility];  Service: Cardiovascular;  Laterality: N/A;  . LEFT HEART CATH AND CORONARY ANGIOGRAPHY N/A 12/21/2018   Procedure: LEFT HEART CATH AND CORONARY ANGIOGRAPHY;  Surgeon: Belva Crome, MD;  Location: [facility];  Service: Cardiovascular;  Laterality: N/A;     Medications: Current Meds  Medication Sig  . aspirin 81 MG chewable tablet Chew 1 tablet (81 mg total) by mouth daily.  Marland Kitchen atorvastatin (LIPITOR) 80 MG tablet Take 1 tablet (80 mg total) by mouth daily at 6 PM.  . losartan (COZAAR) 50 MG tablet Take 1 tablet (50 mg total) by mouth daily.  . nitroGLYCERIN (NITROSTAT) 0.4 MG SL tablet Place 1 tablet (0.4 mg total) under the tongue every 5 (five) minutes as needed for chest pain.  Marland Kitchen spironolactone (ALDACTONE) 25 MG tablet Take 0.5 tablets (12.5 mg total) by mouth daily.  . ticagrelor (  BRILINTA) 90 MG TABS tablet Take 1 tablet (90 mg total) by mouth 2 (two) times daily.  . [DISCONTINUED] carvedilol (COREG) 3.125 MG tablet Take 1 tablet (3.125 mg total) by mouth 2 (two) times daily with a meal.     Allergies: No Known Allergies  Social History: The patient  reports that he has never smoked. He has never used smokeless tobacco.   Family History: The patient's family history includes Aortic aneurysm in his father; COPD in his maternal grandfather; Diabetes in his paternal grandmother; Heart disease in his father and mother; Heart failure in his maternal grandfather; Hypertension in his  father; Skin cancer in his maternal grandmother.   Review of Systems: Please see the history of present illness.   Otherwise, the review of systems is positive for none.   All other systems are reviewed and negative.   Physical Exam: VS:  BP 102/68   Pulse 82   Ht '6\' 2"'  (1.88 m)   Wt 279 lb (126.6 kg)   SpO2 95%   BMI 35.82 kg/m  .  BMI Body mass index is 35.82 kg/m.  Wt Readings from Last 3 Encounters:  01/06/19 279 lb (126.6 kg)  12/21/18 275 lb (124.7 kg)  02/17/14 261 lb (118.4 kg)    General: Pleasant. Well developed, well nourished and in no acute distress. He is of large stature.  HEENT: Normal.  Neck: Supple, no JVD, carotid bruits, or masses noted.  Cardiac: Regular rate and rhythm. No murmurs, rubs, or gallops. No edema.  Respiratory:  Lungs are clear to auscultation bilaterally with normal work of breathing.  GI: Soft and nontender.  MS: No deformity or atrophy. Gait and ROM intact.  Skin: Warm and dry. Color is normal.  Neuro:  Strength and sensation are intact and no gross focal deficits noted.  Psych: Alert, appropriate and with normal affect. Right wrist cath site looks fine.   LABORATORY DATA:  EKG:  EKG is not ordered today.  Lab Results  Component Value Date   WBC 8.4 12/23/2018   HGB 16.7 12/23/2018   HCT 48.8 12/23/2018   PLT 198 12/23/2018   GLUCOSE 93 12/23/2018   CHOL 208 (H) 12/21/2018   TRIG 192 (H) 12/21/2018   HDL 30 (L) 12/21/2018   LDLCALC 140 (H) 12/21/2018   ALT 55 (H) 12/23/2018   AST 72 (H) 12/23/2018   NA 138 12/23/2018   K 3.5 12/23/2018   CL 105 12/23/2018   CREATININE 1.20 12/23/2018   BUN 9 12/23/2018   CO2 24 12/23/2018   INR 1.1 12/21/2018   HGBA1C 4.8 12/22/2018     BNP (last 3 results) Recent Labs    12/21/18 1120  BNP 15.8    ProBNP (last 3 results) No results for input(s): PROBNP in the last 8760 hours.   Other Studies Reviewed Today: Cath: 12/21/2018   Anterior ST elevation myocardial infarction  commencing at 10:45 AM  Eccentric 40% ostial left main  Total occlusion proximal to mid LAD treated with overlapping Onyx 3.0 mm stents deployed at 14 atm reducing 100% stenosis to 0% with TIMI grade III flow.  First diagonal contains 90% mid vessel stenosis. The diagonal is moderate in size.  50% ostial to proximal circumflex. The circumflex supplies a small territory.  RCA is large and contains diffuse luminal irregularities but no high-grade obstruction. PDA contains ostial 60% narrowing  Apical severe hypokinesis with EF 40%. LVEDP 28 mmHg.  RECOMMENDATIONS:   Dual antiplatelet therapy x12 months  Aggressive  risk factor modification  Beta-blocker and ARB therapy  Clinical course will determine discharge but could potentially be discharged as early as 48 hours if no complications.  TTE: 12/23/2018  IMPRESSIONS   1. The left ventricle has normal systolic function with an ejection fraction of 40-45%. The cavity size was normal. There is moderately increased left ventricular wall thickness. There is severe hypokinesis to akinesis of the mid inferoseptal and  anteroseptal walls. There is severe hypokinesis to akinesis of the apical septal, inferior, anterior and inferolateral walls. 2. The right ventricle has normal systolic function. The cavity was normal. There is no increase in right ventricular wall thickness. 3. The tricuspid valve is normal in structure. 4. The aortic valve is tricuspid. 5. The pulmonic valve was normal in structure. 6. Trivial pericardial effusion is present. 7. The pericardial effusion is anterior to the right ventricle. 8. When compared to the prior study: Compared to echo 2015, LV function has declined and there are new wall motion abnormalities. 9. The mitral valve is normal in structure.  Assessment/Plan:  1. Anterior STEMI/CAD-PCI:100% p-mLAD -- s/pPCI with overlapping DES. Does have residual CAD with 60% circumflex, 40%  ostial left main and 60% PDA. Apical severe hypokinesis with EF of 40%. LVEDP 28. Troponin peaked at greater than 65. Echoconfirmed EF 40-45%.Ok to increase walking - 20 minutes a day and add a minute a day. Lab today. He is felt to be doing well. Plan limited echo in 3 months to recheck EF.   2. Ischemic CM - will try to increase the Coreg today to 6.25 mg BID. Will try to leave on Aldactone but may have to stop if BP does not allow - he will monitor.   3. HLD - now on statin  4. Increased LFTs - rechecking today  5. Prior nonsustained VT - felt to be reperfusion - on beta blocker - increasing today.  6. Obesity - seems very motivated to make changes  7. Strong FH for CAD/aortic dissection - checking carotids and AAA duplex.   Current medicines are reviewed with the patient today.  The patient does not have concerns regarding medicines other than what has been noted above.  The following changes have been made:  See above.  Labs/ tests ordered today include:    Orders Placed This Encounter  Procedures  . Basic metabolic panel  . CBC  . Hepatic function panel     Disposition:   FU with me in about 3 weeks.   Patient is agreeable to this plan and will call if any problems develop in the interim.   SignedTruitt Merle, NP  01/06/2019 1:07 PM  [facility] [address] [place], [place]  [address] Phone: [phone] Fax: [phone]

## 2019-01-06 NOTE — Patient Instructions (Signed)
We will be checking the following labs today - BMET, CBC and HPF   Medication Instructions:    Continue with your current medicines. BUT   Increase the Coreg to 6.25 mg twice a day - ok to take 2 of the 3.125 mg tablets twice a day and use up - the RX for the 6.25 is at the pharmacy   If you need a refill on your cardiac medications before your next appointment, please call your pharmacy.     Testing/Procedures To Be Arranged:  Carotid doppler  AAA duplex  Follow-Up:   See me in about 2 to 3 weeks.     At [facility], you and your health needs are our priority.  As part of our continuing mission to provide you with exceptional heart care, we have created designated Provider Care Teams.  These Care Teams include your primary Cardiologist (physician) and Advanced Practice Providers (APPs -  Physician Assistants and Nurse Practitioners) who all work together to provide you with the care you need, when you need it.  Special Instructions:  Marland Kitchen Monitor your BP and HR for [place]  Call the [facility] office at [phone] if you have any questions, problems or concerns.

## 2019-01-08 LAB — BASIC METABOLIC PANEL
BUN/Creatinine Ratio: 11 (ref 9–20)
BUN: 12 mg/dL (ref 6–24)
CO2: 22 mmol/L (ref 20–29)
Calcium: 9.6 mg/dL (ref 8.7–10.2)
Chloride: 100 mmol/L (ref 96–106)
Creatinine, Ser: 1.07 mg/dL (ref 0.76–1.27)
GFR calc Af Amer: 96 mL/min/{1.73_m2} (ref >59)
GFR calc non Af Amer: 83 mL/min/{1.73_m2} (ref >59)
Glucose: 75 mg/dL (ref 65–99)
Potassium: 5 mmol/L (ref 3.5–5.2)
Sodium: 142 mmol/L (ref 134–144)

## 2019-01-08 LAB — CBC
Hematocrit: 51.7 % — ABNORMAL HIGH (ref 37.5–51.0)
Hemoglobin: 17.7 g/dL (ref 13.0–17.7)
MCH: 29.4 pg (ref 26.6–33.0)
MCHC: 34.2 g/dL (ref 31.5–35.7)
MCV: 86 fL (ref 79–97)
Platelets: 317 10*3/uL (ref 150–450)
RBC: 6.03 x10E6/uL — ABNORMAL HIGH (ref 4.14–5.80)
RDW: 12.7 % (ref 11.6–15.4)
WBC: 8.4 10*3/uL (ref 3.4–10.8)

## 2019-01-08 LAB — HEPATIC FUNCTION PANEL
ALT: 97 IU/L — ABNORMAL HIGH (ref 0–44)
AST: 48 IU/L — ABNORMAL HIGH (ref 0–40)
Albumin: 5 g/dL (ref 4.0–5.0)
Alkaline Phosphatase: 100 IU/L (ref 39–117)
Bilirubin Total: 0.7 mg/dL (ref 0.0–1.2)
Bilirubin, Direct: 0.18 mg/dL (ref 0.00–0.40)
Total Protein: 6.8 g/dL (ref 6.0–8.5)

## 2019-01-10 NOTE — Telephone Encounter (Signed)
Called and spoke with pt in regards to CR, adv we have recv'd the pt referral. And at this time we are not scheduling due to the COVID-19. Once we have resume scheduling we will contact the pt. She/He verbalized understanding. °Gloria W. Support Rep II °

## 2019-01-13 ENCOUNTER — Other Ambulatory Visit: Payer: Self-pay

## 2019-01-13 MED ORDER — ASPIRIN 81 MG PO CHEW: 81.0000 mg | CHEWABLE_TABLET | Freq: Every day | ORAL | 3 refills | Status: DC

## 2019-01-13 MED ORDER — TICAGRELOR 90 MG PO TABS: 90.0000 mg | ORAL_TABLET | Freq: Two times a day (BID) | ORAL | 3 refills | Status: DC

## 2019-01-13 MED ORDER — ATORVASTATIN CALCIUM 80 MG PO TABS: 80.0000 mg | ORAL_TABLET | Freq: Every day | ORAL | 3 refills | Status: DC

## 2019-01-13 MED ORDER — CARVEDILOL 6.25 MG PO TABS: 6.2500 mg | ORAL_TABLET | Freq: Two times a day (BID) | ORAL | 3 refills | Status: DC

## 2019-01-13 MED ORDER — LOSARTAN POTASSIUM 50 MG PO TABS: 50.0000 mg | ORAL_TABLET | Freq: Every day | ORAL | 3 refills | Status: DC

## 2019-01-13 MED ORDER — SPIRONOLACTONE 25 MG PO TABS: 12.5000 mg | ORAL_TABLET | Freq: Every day | ORAL | 3 refills | Status: DC

## 2019-01-20 ENCOUNTER — Telehealth: Payer: Self-pay | Admitting: *Deleted

## 2019-01-20 NOTE — Telephone Encounter (Signed)
Follow up:   Patient returning a cal back concerning appt.

## 2019-01-20 NOTE — Telephone Encounter (Signed)
-----   Message from Lyn Records, MD sent at 01/20/2019  1:38 PM EDT ----- Regarding: Post MI follow-up Had preferred to do a virtual visit.  He would need to provide blood pressure and heart rate information.  I am particularly interested in heart rate. If he is unable to do virtual follow-up, he will need to come into the office.

## 2019-01-20 NOTE — Telephone Encounter (Signed)
Left message to call back  

## 2019-01-21 ENCOUNTER — Telehealth: Payer: Self-pay | Admitting: Interventional Cardiology

## 2019-01-21 MED ORDER — TICAGRELOR 90 MG PO TABS: 90.0000 mg | ORAL_TABLET | Freq: Two times a day (BID) | ORAL | 3 refills | Status: DC

## 2019-01-21 MED ORDER — SPIRONOLACTONE 25 MG PO TABS: 12.5000 mg | ORAL_TABLET | Freq: Every day | ORAL | 3 refills | Status: DC

## 2019-01-21 MED ORDER — ATORVASTATIN CALCIUM 80 MG PO TABS: 80.0000 mg | ORAL_TABLET | Freq: Every day | ORAL | 3 refills | Status: DC

## 2019-01-21 MED ORDER — LOSARTAN POTASSIUM 50 MG PO TABS: 50.0000 mg | ORAL_TABLET | Freq: Every day | ORAL | 3 refills | Status: DC

## 2019-01-21 NOTE — Telephone Encounter (Signed)
°*  STAT* If patient is at the pharmacy, call can be transferred to refill team.   1. Which medications need to be refilled? (please list name of each medication and dose if known)  atorvastatin (LIPITOR) 80 MG tablet losartan (COZAAR) 50 MG tablet ticagrelor (BRILINTA) 90 MG TABS tablet spironolactone (ALDACTONE) 25 MG tablet  2. Which pharmacy/location (including street and city if local pharmacy) is medication to be sent to? [facility]  3. Do they need a 30 day or 90 day supply? 90   Pt  Pre-filled his pill box, and will be out of medication Thursday Morning.

## 2019-01-21 NOTE — Telephone Encounter (Signed)
Spoke with pt and he prefers to come into the office.  Pt denies any COVID symptoms or recent travel out of the state or country.  Moved appt to 8AM.

## 2019-01-21 NOTE — Telephone Encounter (Signed)
Pt called back returning Jennifer's call

## 2019-01-22 ENCOUNTER — Telehealth: Payer: Self-pay

## 2019-01-22 NOTE — Telephone Encounter (Signed)
Pt has given consent for Video Tele-Visit on 01/23/2019 with Dr Katrinka Blazing. Pt has been informed to have BP, HR, Weight and current medications available.   Consent Form sent through MyChart

## 2019-01-23 ENCOUNTER — Other Ambulatory Visit: Payer: Self-pay

## 2019-01-23 ENCOUNTER — Telehealth (INDEPENDENT_AMBULATORY_CARE_PROVIDER_SITE_OTHER): Payer: 59 | Admitting: Interventional Cardiology

## 2019-01-23 ENCOUNTER — Encounter: Payer: Self-pay | Admitting: Interventional Cardiology

## 2019-01-23 VITALS — BP 122/85 | HR 66 | Ht 74.0 in | Wt 258.7 lb

## 2019-01-23 DIAGNOSIS — E785 Hyperlipidemia, unspecified: Secondary | ICD-10-CM

## 2019-01-23 DIAGNOSIS — I2510 Atherosclerotic heart disease of native coronary artery without angina pectoris: Secondary | ICD-10-CM

## 2019-01-23 DIAGNOSIS — E669 Obesity, unspecified: Secondary | ICD-10-CM | POA: Diagnosis not present

## 2019-01-23 DIAGNOSIS — I10 Essential (primary) hypertension: Secondary | ICD-10-CM | POA: Diagnosis not present

## 2019-01-23 DIAGNOSIS — I5042 Chronic combined systolic (congestive) and diastolic (congestive) heart failure: Secondary | ICD-10-CM | POA: Insufficient documentation

## 2019-01-23 NOTE — Progress Notes (Signed)
Virtual Visit via Video Note    Evaluation Performed:  Follow-up visit  This visit type was conducted due to national recommendations for restrictions regarding the COVID-19 Pandemic (e.g. social distancing).  This format is felt to be most appropriate for this patient at this time.  All issues noted in this document were discussed and addressed.  No physical exam was performed (except for noted visual exam findings with Video Visits).  Please refer to the patient's chart (MyChart message for video visits and phone note for telephone visits) for the patient's consent to telehealth for [facility].  Date:  01/23/2019   ID:  Devin Wright, DOB 05-03-1971, MRN [id number]  Patient Location:  Home  Provider location:   Office  PCP:  Merri Brunette, MD  Cardiologist:  Lesleigh Noe, MD  Electrophysiologist:  None   Chief Complaint: Follow-up CAD post MI.  History of Present Illness:    Devin Wright is a 48 y.o. male who presents via audio/video conferencing for a telehealth visit today.  The patient has coronary artery disease initially presenting as an acute anterior myocardial infarction December 21, 2018 treated with LAD stent.  Other medical problems include moderate obesity, hyperlipidemia, and elevated blood pressure.  Overall, he is doing well.  He denies dyspnea.  He has not had chest pain, orthopnea, PND, or other complaints.  No medication side effects.  He has not had peripheral edema.  No episodes of syncope have occurred.  He has been walking for exercising greater than 150 minutes/week without limitation.  The patient does not have symptoms concerning for COVID-19 infection (fever, chills, cough, or new shortness of breath).    Prior CV studies:   The following studies were reviewed today:   The coronary angiogram performed on December 21, 2018 was reviewed.  2D Doppler echocardiogram December 23, 2018:  IMPRESSIONS    1. The left ventricle has normal systolic  function with an ejection fraction of 40-45%. The cavity size was normal. There is moderately increased left ventricular wall thickness. There is severe hypokinesis to akinesis of the mid inferoseptal and  anteroseptal walls. There is severe hypokinesis to akinesis of the apical septal, inferior, anterior and inferolateral walls.  2. The right ventricle has normal systolic function. The cavity was normal. There is no increase in right ventricular wall thickness.  3. The tricuspid valve is normal in structure.  4. The aortic valve is tricuspid.  5. The pulmonic valve was normal in structure.  6. Trivial pericardial effusion is present.  7. The pericardial effusion is anterior to the right ventricle.  8. When compared to the prior study: Compared to echo 2015, LV function has declined and there are new wall motion abnormalities. 9. The mitral valve is normal in structure   Past Medical History:  Diagnosis Date  . Acute systolic (congestive) heart failure ([facility])   . Family history of heart disease   . HTN (hypertension)   . Hyperlipidemia   . Palpitations   . STEMI (ST elevation myocardial infarction) ([facility])    PCI/DES to p/mLAD, 60% lcx, 40% ostial LM, and 60% PDA, EF 45%   Past Surgical History:  Procedure Laterality Date  . CORONARY/GRAFT ACUTE MI REVASCULARIZATION N/A 12/21/2018   Procedure: Coronary/Graft Acute MI Revascularization;  Surgeon: Lyn Records, MD;  Location:  [facility];  Service: Cardiovascular;  Laterality: N/A;  . LEFT HEART CATH AND CORONARY ANGIOGRAPHY N/A 12/21/2018   Procedure: LEFT HEART CATH AND CORONARY ANGIOGRAPHY;  Surgeon: Lyn Records, MD;  Location: [facility] INVASIVE [facility];  Service: Cardiovascular;  Laterality: N/A;     Current Meds  Medication Sig  . aspirin 81 MG chewable tablet Chew 1 tablet (81 mg total) by mouth daily.  Marland Kitchen atorvastatin (LIPITOR) 80 MG tablet Take 1 tablet (80 mg total) by mouth daily at 6 PM.  . carvedilol (COREG) 6.25 MG tablet  Take 1 tablet (6.25 mg total) by mouth 2 (two) times daily.  Marland Kitchen losartan (COZAAR) 50 MG tablet Take 1 tablet (50 mg total) by mouth daily.  . nitroGLYCERIN (NITROSTAT) 0.4 MG SL tablet Place 1 tablet (0.4 mg total) under the tongue every 5 (five) minutes as needed for chest pain.  Marland Kitchen spironolactone (ALDACTONE) 25 MG tablet Take 0.5 tablets (12.5 mg total) by mouth daily.  . ticagrelor (BRILINTA) 90 MG TABS tablet Take 1 tablet (90 mg total) by mouth 2 (two) times daily.     Allergies:   Patient has no known allergies.   Social History   Tobacco Use  . Smoking status: Never Smoker  . Smokeless tobacco: Never Used  Substance Use Topics  . Alcohol use: Not on file  . Drug use: Not on file     Family Hx: The patient's family history includes Aortic aneurysm in his father; COPD in his maternal grandfather; Diabetes in his paternal grandmother; Heart disease in his father and mother; Heart failure in his maternal grandfather; Hypertension in his father; Skin cancer in his maternal grandmother.  ROS:   Please see the history of present illness.    No complaints.  Feels his level of physical activity is restrained. All other systems reviewed and are negative.   Labs/Other Tests and Data Reviewed:    Recent Labs: 12/21/2018: B Natriuretic Peptide 15.8 12/22/2018: Magnesium 2.2 01/06/2019: ALT 97; BUN 12; Creatinine, Ser 1.07; Hemoglobin 17.7; Platelets 317; Potassium 5.0; Sodium 142   Recent Lipid Panel Lab Results  Component Value Date/Time   CHOL 208 (H) 12/21/2018 11:20 AM   TRIG 192 (H) 12/21/2018 11:20 AM   HDL 30 (L) 12/21/2018 11:20 AM   CHOLHDL 6.9 12/21/2018 11:20 AM   LDLCALC 140 (H) 12/21/2018 11:20 AM    Wt Readings from Last 3 Encounters:  01/23/19 258 lb 11.2 oz (117.3 kg)  01/06/19 279 lb (126.6 kg)  12/21/18 275 lb (124.7 kg)     Objective:    Vital Signs:  BP 122/85   Pulse 66   Ht 6\' 2"  (1.88 m)   Wt 258 lb 11.2 oz (117.3 kg)   BMI 33.22 kg/m    Well  nourished, well developed male in no acute distress. Vital signs are stable as measured by the patient.  Skin color is pink.  No edema is noted.  ASSESSMENT & PLAN:    1. Coronary artery disease involving native coronary artery of native heart without angina pectoris   2. Hyperlipidemia LDL goal <70   3. Essential hypertension   4. Obesity (BMI 35.0-39.9 without comorbidity)   5. Chronic combined systolic and diastolic HF (heart failure) (HCC)    PLAN in order of problem:  1. Burden of coronary disease was reviewed with patient.  He has a stent in the LAD with residual diagonal disease and mild to moderate distal left main disease.  We focused on secondary risk prevention including all the measures identified below.  He is doing a great job at weight loss, exercise, and is compliant with his medication. 2. We discussed the  target of LDL less than 70 and possibly in the future striving for LDL 50 when the guidelines change. 3. Blood pressures have generally been running less than 125 mmHg systolic.  Target is 130/80 mmHg.  May need to make adjustments in blood pressure therapy as he loses weight. 4. He has lost 18 pounds since his myocardial infarction by changing diet and exercise.  I congratulated him on the success. 5. Left ventricular function is within a reasonable range with last estimated EF of 45%.  He is on mineralocorticoid antagonist, ARB, and beta-blocker therapy.  Current heart rates and blood pressure dictate that no further up titration be attempted at this time.  Last laboratory data demonstrated that he was tolerating Aldactone without significant problems.  Overall education and awareness concerning primary/secondary risk prevention was discussed in detail: LDL less than 70, hemoglobin A1c less than 7, blood pressure target less than 130/80 mmHg, >150 minutes of moderate aerobic activity per week, avoidance of smoking, weight control (via diet and exercise), and continued  surveillance/management of/for obstructive sleep apnea.     COVID-19 Education: The signs and symptoms of COVID-19 were discussed with the patient and how to seek care for testing (follow up with PCP or arrange E-visit).  The importance of social distancing was discussed today.  Patient Risk:   After full review of this patient's clinical status, I feel that they are at least moderate risk at this time.  Time:   Today, I have spent 20 minutes with the patient with telehealth technology discussing management of underlying coronary artery disease and risk factor modification..     Medication Adjustments/Labs and Tests Ordered: Current medicines are reviewed at length with the patient today.  Concerns regarding medicines are outlined above.  Tests Ordered: Orders Placed This Encounter  Procedures  . Hepatic function panel  . Lipid panel   Medication Changes: No orders of the defined types were placed in this encounter.   Disposition:  Follow up in 4 month(s)  Signed, Lesleigh NoeHenry W  III, MD  01/23/2019 8:39 AM    [facility]

## 2019-01-23 NOTE — Patient Instructions (Signed)
Medication Instructions:  Your physician recommends that you continue on your current medications as directed. Please refer to the Current Medication list given to you today.  If you need a refill on your cardiac medications before your next appointment, please call your pharmacy.   Lab work: Your physician recommends that you return for lab work in: early May (Liver, Lipid)  If you have labs (blood work) drawn today and your tests are completely normal, you will receive your results only by: Marland Kitchen MyChart Message (if you have MyChart) OR . A paper copy in the mail If you have any lab test that is abnormal or we need to change your treatment, we will call you to review the results.  Testing/Procedures: None  Follow-Up: At [facility], you and your health needs are our priority.  As part of our continuing mission to provide you with exceptional heart care, we have created designated Provider Care Teams.  These Care Teams include your primary Cardiologist (physician) and Advanced Practice Providers (APPs -  Physician Assistants and Nurse Practitioners) who all work together to provide you with the care you need, when you need it. You will need a follow up appointment in 4 months.  Please call our office 2 months in advance to schedule this appointment.  You may see Lesleigh Noe, MD or one of the following Advanced Practice Providers on your designated Care Team:   Norma Fredrickson, NP Nada Boozer, NP . Georgie Chard, NP  Any Other Special Instructions Will Be Listed Below (If Applicable).

## 2019-01-31 ENCOUNTER — Encounter (HOSPITAL_COMMUNITY): Payer: 59

## 2019-01-31 ENCOUNTER — Other Ambulatory Visit (HOSPITAL_COMMUNITY): Payer: 59

## 2019-02-20 ENCOUNTER — Telehealth (HOSPITAL_COMMUNITY): Payer: Self-pay

## 2019-02-24 ENCOUNTER — Other Ambulatory Visit: Payer: 59

## 2019-03-04 ENCOUNTER — Other Ambulatory Visit: Payer: 59

## 2019-03-04 ENCOUNTER — Other Ambulatory Visit: Payer: Self-pay

## 2019-03-04 DIAGNOSIS — I2510 Atherosclerotic heart disease of native coronary artery without angina pectoris: Secondary | ICD-10-CM

## 2019-03-04 DIAGNOSIS — E785 Hyperlipidemia, unspecified: Secondary | ICD-10-CM

## 2019-03-04 LAB — HEPATIC FUNCTION PANEL
ALT: 64 IU/L — ABNORMAL HIGH (ref 0–44)
AST: 36 IU/L (ref 0–40)
Albumin: 4.7 g/dL (ref 4.0–5.0)
Alkaline Phosphatase: 111 IU/L (ref 39–117)
Bilirubin Total: 1.2 mg/dL (ref 0.0–1.2)
Bilirubin, Direct: 0.28 mg/dL (ref 0.00–0.40)
Total Protein: 6.5 g/dL (ref 6.0–8.5)

## 2019-03-04 LAB — LIPID PANEL
Chol/HDL Ratio: 3.7 ratio (ref 0.0–5.0)
Cholesterol, Total: 126 mg/dL (ref 100–199)
HDL: 34 mg/dL — ABNORMAL LOW (ref >39)
LDL Calculated: 67 mg/dL (ref 0–99)
Triglycerides: 126 mg/dL (ref 0–149)
VLDL Cholesterol Cal: 25 mg/dL (ref 5–40)

## 2019-03-07 ENCOUNTER — Ambulatory Visit (HOSPITAL_COMMUNITY)
Admission: RE | Admit: 2019-03-07 | Discharge: 2019-03-07 | Disposition: A | Discharge status: Home / Self Care | Payer: 59 | Source: Ambulatory Visit | Attending: Nurse Practitioner | Admitting: Nurse Practitioner

## 2019-03-07 ENCOUNTER — Other Ambulatory Visit: Payer: Self-pay

## 2019-03-07 ENCOUNTER — Ambulatory Visit (HOSPITAL_BASED_OUTPATIENT_CLINIC_OR_DEPARTMENT_OTHER)
Admission: RE | Admit: 2019-03-07 | Discharge: 2019-03-07 | Disposition: A | Discharge status: Home / Self Care | Payer: 59 | Source: Ambulatory Visit | Attending: Nurse Practitioner | Admitting: Nurse Practitioner

## 2019-03-07 DIAGNOSIS — E7849 Other hyperlipidemia: Secondary | ICD-10-CM | POA: Insufficient documentation

## 2019-03-07 DIAGNOSIS — I255 Ischemic cardiomyopathy: Secondary | ICD-10-CM | POA: Insufficient documentation

## 2019-03-07 DIAGNOSIS — Z8249 Family history of ischemic heart disease and other diseases of the circulatory system: Secondary | ICD-10-CM | POA: Diagnosis present

## 2019-03-10 ENCOUNTER — Other Ambulatory Visit: Payer: Self-pay | Admitting: *Deleted

## 2019-03-10 DIAGNOSIS — E7849 Other hyperlipidemia: Secondary | ICD-10-CM

## 2019-03-10 DIAGNOSIS — I2510 Atherosclerotic heart disease of native coronary artery without angina pectoris: Secondary | ICD-10-CM

## 2019-03-10 DIAGNOSIS — I5042 Chronic combined systolic (congestive) and diastolic (congestive) heart failure: Secondary | ICD-10-CM

## 2019-03-10 DIAGNOSIS — R7989 Other specified abnormal findings of blood chemistry: Secondary | ICD-10-CM

## 2019-03-10 NOTE — Addendum Note (Signed)
Addended by: Julio Sicks on: 03/10/2019 09:40 AM   Modules accepted: Orders

## 2019-03-10 NOTE — Addendum Note (Signed)
Addended by: Julio Sicks on: 03/10/2019 09:38 AM   Modules accepted: Orders

## 2019-04-09 ENCOUNTER — Encounter (HOSPITAL_COMMUNITY): Payer: Self-pay

## 2019-04-09 ENCOUNTER — Telehealth (HOSPITAL_COMMUNITY): Payer: Self-pay

## 2019-04-09 NOTE — Telephone Encounter (Signed)
No response from pt, message left on voicemail regarding Virtual Cardiac Rehab for the continuation of his participation.   Please contact letter sent to address on file in epic. Await response, if no response by 04/23/2019 will discharge pt from the program.

## 2019-04-14 ENCOUNTER — Telehealth (HOSPITAL_COMMUNITY): Payer: Self-pay

## 2019-04-14 ENCOUNTER — Telehealth (HOSPITAL_COMMUNITY): Payer: Self-pay | Admitting: *Deleted

## 2019-04-14 ENCOUNTER — Encounter (HOSPITAL_COMMUNITY)
Admission: RE | Admit: 2019-04-14 | Discharge: 2019-04-14 | Disposition: A | Discharge status: Home / Self Care | Payer: Self-pay | Source: Ambulatory Visit | Attending: Interventional Cardiology | Admitting: Interventional Cardiology

## 2019-04-14 NOTE — Telephone Encounter (Signed)
Pt is interested in participating in Virtual Cardiac Rehab. Pt advised that Virtual Cardiac Rehab is provided at no cost to the patient.  Checklist:  1. Pt has smart device  ie smartphone and/or ipad for downloading an app  Yes 2. Reliable internet/wifi service    Yes 3. Understands how to use their smartphone and navigate within an app.  Yes   Reviewed with pt the scheduling process for virtual cardiac rehab.  Pt verbalized understanding.  Called and spoke to pt regarding Virtual Cardiac Rehab.  Pt  was able to download the [employer] app on their smart device with no issues. Pt set up their account and received the following welcome message -"Welcome to the [facility] program. We hope that you will find the exercise program beneficial in your recovery process. Our staff is available to assist with any questions/concerns about your exercise routine. Best wishes". Brief orientation provided to with the advisement to watch the "Intro to Rehab" series located under the Resource tab. Pt verbalized understanding. Will continue to follow and monitor pt progress with feedback as needed.Barnet Pall, RN,BSN 04/14/2019 3:22 PM

## 2019-04-14 NOTE — Telephone Encounter (Signed)

## 2019-05-12 ENCOUNTER — Telehealth (HOSPITAL_COMMUNITY): Payer: Self-pay | Admitting: *Deleted

## 2019-05-12 NOTE — Telephone Encounter (Signed)
Pt inactive in [employer] App.  Letter mailed requesting patient to return call regarding this by 05/16/2019. If no response, will discharge from cardiac rehab program.  

## 2019-05-29 ENCOUNTER — Ambulatory Visit (HOSPITAL_COMMUNITY): Payer: 59 | Attending: Cardiology

## 2019-05-29 ENCOUNTER — Other Ambulatory Visit: Payer: Self-pay

## 2019-05-29 DIAGNOSIS — I5042 Chronic combined systolic (congestive) and diastolic (congestive) heart failure: Secondary | ICD-10-CM | POA: Diagnosis present

## 2019-05-29 MED ORDER — PERFLUTREN LIPID MICROSPHERE
1.0000 mL | INTRAVENOUS | Status: AC | PRN
  Administered 2019-05-29: 2 mL via INTRAVENOUS

## 2019-06-04 ENCOUNTER — Other Ambulatory Visit: Payer: Self-pay | Admitting: *Deleted

## 2019-06-04 DIAGNOSIS — I77810 Thoracic aortic ectasia: Secondary | ICD-10-CM

## 2019-06-04 NOTE — Addendum Note (Signed)
Addended by: Loren Racer on: 06/04/2019 01:46 PM   Modules accepted: Orders

## 2019-06-16 ENCOUNTER — Ambulatory Visit: Payer: 59 | Admitting: Interventional Cardiology

## 2019-06-24 ENCOUNTER — Other Ambulatory Visit: Payer: 59

## 2019-06-24 ENCOUNTER — Other Ambulatory Visit: Payer: Self-pay

## 2019-06-24 DIAGNOSIS — R7989 Other specified abnormal findings of blood chemistry: Secondary | ICD-10-CM

## 2019-06-24 DIAGNOSIS — I77810 Thoracic aortic ectasia: Secondary | ICD-10-CM

## 2019-06-24 DIAGNOSIS — I5042 Chronic combined systolic (congestive) and diastolic (congestive) heart failure: Secondary | ICD-10-CM

## 2019-06-24 DIAGNOSIS — I2510 Atherosclerotic heart disease of native coronary artery without angina pectoris: Secondary | ICD-10-CM

## 2019-06-24 DIAGNOSIS — E7849 Other hyperlipidemia: Secondary | ICD-10-CM

## 2019-06-24 LAB — LIPID PANEL
Chol/HDL Ratio: 3.8 ratio (ref 0.0–5.0)
Cholesterol, Total: 113 mg/dL (ref 100–199)
HDL: 30 mg/dL — ABNORMAL LOW (ref >39)
LDL Chol Calc (NIH): 66 mg/dL (ref 0–99)
Triglycerides: 87 mg/dL (ref 0–149)
VLDL Cholesterol Cal: 17 mg/dL (ref 5–40)

## 2019-06-24 LAB — BASIC METABOLIC PANEL
BUN/Creatinine Ratio: 13 (ref 9–20)
BUN: 14 mg/dL (ref 6–24)
CO2: 26 mmol/L (ref 20–29)
Calcium: 9 mg/dL (ref 8.7–10.2)
Chloride: 103 mmol/L (ref 96–106)
Creatinine, Ser: 1.05 mg/dL (ref 0.76–1.27)
GFR calc Af Amer: 98 mL/min/{1.73_m2} (ref >59)
GFR calc non Af Amer: 85 mL/min/{1.73_m2} (ref >59)
Glucose: 85 mg/dL (ref 65–99)
Potassium: 4.3 mmol/L (ref 3.5–5.2)
Sodium: 137 mmol/L (ref 134–144)

## 2019-06-25 ENCOUNTER — Ambulatory Visit (INDEPENDENT_AMBULATORY_CARE_PROVIDER_SITE_OTHER)
Admission: RE | Admit: 2019-06-25 | Discharge: 2019-06-25 | Disposition: A | Discharge status: Home / Self Care | Payer: 59 | Source: Ambulatory Visit | Attending: Interventional Cardiology | Admitting: Interventional Cardiology

## 2019-06-25 ENCOUNTER — Other Ambulatory Visit: Payer: Self-pay

## 2019-06-25 DIAGNOSIS — I77810 Thoracic aortic ectasia: Secondary | ICD-10-CM | POA: Diagnosis not present

## 2019-06-25 LAB — HEPATIC FUNCTION PANEL
ALT: 45 IU/L — ABNORMAL HIGH (ref 0–44)
AST: 25 IU/L (ref 0–40)
Albumin: 4.4 g/dL (ref 4.0–5.0)
Alkaline Phosphatase: 93 IU/L (ref 39–117)
Bilirubin Total: 1.2 mg/dL (ref 0.0–1.2)
Bilirubin, Direct: 0.3 mg/dL (ref 0.00–0.40)
Total Protein: 5.9 g/dL — ABNORMAL LOW (ref 6.0–8.5)

## 2019-06-25 LAB — HIGH SENSITIVITY CRP: CRP, High Sensitivity: 1.26 mg/L (ref 0.00–3.00)

## 2019-06-25 LAB — LIPOPROTEIN A (LPA): Lipoprotein (a): 243.9 nmol/L — ABNORMAL HIGH (ref <75.0)

## 2019-06-25 MED ORDER — IOHEXOL 350 MG/ML SOLN
100.0000 mL | Freq: Once | INTRAVENOUS | Status: AC | PRN
  Administered 2019-06-25: 100 mL via INTRAVENOUS

## 2019-06-25 NOTE — Progress Notes (Signed)
Virtual Visit via Video Note   This visit type was conducted due to national recommendations for restrictions regarding the COVID-19 Pandemic (e.g. social distancing) in an effort to limit this patient's exposure and mitigate transmission in our community.  Due to his co-morbid illnesses, this patient is at least at moderate risk for complications without adequate follow up.  This format is felt to be most appropriate for this patient at this time.  All issues noted in this document were discussed and addressed.  A limited physical exam was performed with this format.  Please refer to the patient's chart for his consent to telehealth for [facility]   Date:  06/25/2019   ID:  Devin Wright, DOB 06-Mar-1971, MRN [id number]  Patient Location: Home Provider Location: Home  PCP:  Deland Pretty, MD  Cardiologist:  Sinclair Grooms, MD  Electrophysiologist:  None   Evaluation Performed:  Follow-Up Visit  Chief Complaint:  MI/CAD  History of Present Illness:    Devin Wright is a 48 y.o. male with with a hx of coronary artery disease initially presenting as an acute anterior myocardial infarction December 21, 2018 treated with LAD stent.  Other medical problems include moderate obesity, hyperlipidemia, and elevated blood pressure.  Devin Wright is doing well.  He is having cramping in his lower extremities and toes, particularly in the mornings when he awakens.  He is staying physically active.  He denies shortness of breath with walking.  He is walked up to 8 miles without limitations.  He is gradually losing weight.  He denies angina.  He is concerned about the possibility of an aortic aneurysm.  The recent chest CT with angiography did not reveal aortic enlargement/aneurysm.  Largest diameter in the ascending aorta was 3.8 cm.  Recent updated laboratory data revealed an LDL of 66 and LP(a) is 45.  The patient does not have symptoms concerning for COVID-19 infection (fever, chills, cough, or  new shortness of breath).    Past Medical History:  Diagnosis Date  . Acute systolic (congestive) heart failure ([place])   . Family history of heart disease   . HTN (hypertension)   . Hyperlipidemia   . Palpitations   . STEMI (ST elevation myocardial infarction) ([place])    PCI/DES to p/mLAD, 60% lcx, 40% ostial LM, and 60% PDA, EF 45%   Past Surgical History:  Procedure Laterality Date  . CORONARY/GRAFT ACUTE MI REVASCULARIZATION N/A 12/21/2018   Procedure: Coronary/Graft Acute MI Revascularization;  Surgeon: Belva Crome, MD;  Location: [facility];  Service: Cardiovascular;  Laterality: N/A;  . LEFT HEART CATH AND CORONARY ANGIOGRAPHY N/A 12/21/2018   Procedure: LEFT HEART CATH AND CORONARY ANGIOGRAPHY;  Surgeon: Belva Crome, MD;  Location: [facility];  Service: Cardiovascular;  Laterality: N/A;     No outpatient medications have been marked as taking for the 06/26/19 encounter (Appointment) with Belva Crome, MD.     Allergies:   Patient has no known allergies.   Social History   Tobacco Use  . Smoking status: Never Smoker  . Smokeless tobacco: Never Used  Substance Use Topics  . Alcohol use: Not on file  . Drug use: Not on file     Family Hx: The patient's family history includes Aortic aneurysm in his father; COPD in his maternal grandfather; Diabetes in his paternal grandmother; Heart disease in his father and mother; Heart failure in his maternal grandfather; Hypertension in his father; Skin cancer in his  maternal grandmother.  ROS:   Please see the history of present illness.    He has anxiety concerning his health.  He is not sleeping as well as he would like to. All other systems reviewed and are negative.   Prior CV studies:   The following studies were reviewed today:  2D Doppler echocardiogram August 2020: IMPRESSIONS    1. The left ventricle has a visually estimated ejection fraction of 50%. The cavity size was normal. There is mildly  increased left ventricular wall thickness. Left ventricular diastolic parameters were normal. Mid anteroseptal and apical septal  akinesis.  2. The aortic valve is tricuspid. No stenosis of the aortic valve.  3. The aorta is abnormal in size and structure.  4. There is mild dilatation of the aortic root measuring 39 mm.  5. The right ventricle has normal systolic function. The cavity was normal. There is no increase in right ventricular wall thickness.  6. No evidence of mitral valve stenosis. No significant mitral regurgitation.  7. The IVC was normal in size. No complete TR doppler jet so unable to estimate PA systolic pressure.  Labs/Other Tests and Data Reviewed:    EKG:  No ECG reviewed.  Recent Labs: 12/21/2018: B Natriuretic Peptide 15.8 12/22/2018: Magnesium 2.2 01/06/2019: Hemoglobin 17.7; Platelets 317 06/24/2019: ALT 45; BUN 14; Creatinine, Ser 1.05; Potassium 4.3; Sodium 137   Recent Lipid Panel Lab Results  Component Value Date/Time   CHOL 113 06/24/2019 08:19 AM   TRIG 87 06/24/2019 08:19 AM   HDL 30 (L) 06/24/2019 08:19 AM   CHOLHDL 3.8 06/24/2019 08:19 AM   CHOLHDL 6.9 12/21/2018 11:20 AM   LDLCALC 67 03/04/2019 11:00 AM    Wt Readings from Last 3 Encounters:  01/23/19 258 lb 11.2 oz (117.3 kg)  01/06/19 279 lb (126.6 kg)  12/21/18 275 lb (124.7 kg)     Objective:    Vital Signs:  There were no vitals taken for this visit.   VITAL SIGNS:  reviewed GEN:  no acute distress RESPIRATORY:  normal respiratory effort, symmetric expansion CARDIOVASCULAR:  no peripheral edema  ASSESSMENT & PLAN:    1. Coronary artery disease involving native coronary artery of native heart without angina pectoris   2. Chronic combined systolic and diastolic HF (heart failure) (HCC)   3. Aortic root dilatation (HCC)   4. Other hyperlipidemia   5. Hyperlipidemia LDL goal <70   6. Essential hypertension   7. Educated About Covid-19 Virus Infection    PLAN:  1. Coronary disease  with no active symptoms of angina.  Secondary risk prevention discussed in detail.  Further identified the presence of an elevated LP(a).  This will need to be managed as a carries risk of aortic stenosis and vascular ischemic events.  Aerobic activity is encouraged. 2. No symptoms to suggest volume overload or heart failure. 3. Aortic root is upper normal.  There is no aneurysm. 4. LP(a) is 244.  LDL 66 on high intensity statin (atorvastatin).  Will refer back to the lipid clinic for consideration of PCSK9 therapy which will decrease LP(a), and we may decrease statin intensity which she had relief the problem he is having with muscle cramps. 5. Blood pressure target 130/80 mmHg. 6. Social distancing, mask wearing, and handwashing to avoid COVID-19  Overall education and awareness concerning primary/secondary risk prevention was discussed in detail: LDL less than 70, hemoglobin A1c less than 7, blood pressure target less than 130/80 mmHg, >150 minutes of moderate aerobic activity per week,  avoidance of smoking, weight control (via diet and exercise), and continued surveillance/management of/for obstructive sleep apnea.  106-month in person follow-up  COVID-19 Education: The signs and symptoms of COVID-19 were discussed with the patient and how to seek care for testing (follow up with PCP or arrange E-visit).  The importance of social distancing was discussed today.  Time:   Today, I have spent 18 minutes with the patient with telehealth technology discussing the above problems.     Medication Adjustments/Labs and Tests Ordered: Current medicines are reviewed at length with the patient today.  Concerns regarding medicines are outlined above.   Tests Ordered: No orders of the defined types were placed in this encounter.   Medication Changes: No orders of the defined types were placed in this encounter.   Follow Up:  In Person in 4 month(s)  Signed, Lesleigh Noe, MD  06/25/2019 7:41 PM     [facility]

## 2019-06-26 ENCOUNTER — Encounter: Payer: Self-pay | Admitting: Interventional Cardiology

## 2019-06-26 ENCOUNTER — Telehealth (INDEPENDENT_AMBULATORY_CARE_PROVIDER_SITE_OTHER): Payer: 59 | Admitting: Interventional Cardiology

## 2019-06-26 VITALS — BP 131/68 | HR 83 | Ht 74.0 in | Wt 247.0 lb

## 2019-06-26 DIAGNOSIS — I2510 Atherosclerotic heart disease of native coronary artery without angina pectoris: Secondary | ICD-10-CM

## 2019-06-26 DIAGNOSIS — I5042 Chronic combined systolic (congestive) and diastolic (congestive) heart failure: Secondary | ICD-10-CM

## 2019-06-26 DIAGNOSIS — E7849 Other hyperlipidemia: Secondary | ICD-10-CM

## 2019-06-26 DIAGNOSIS — I77810 Thoracic aortic ectasia: Secondary | ICD-10-CM

## 2019-06-26 DIAGNOSIS — Z7189 Other specified counseling: Secondary | ICD-10-CM

## 2019-06-26 DIAGNOSIS — I10 Essential (primary) hypertension: Secondary | ICD-10-CM

## 2019-06-26 DIAGNOSIS — E785 Hyperlipidemia, unspecified: Secondary | ICD-10-CM

## 2019-06-26 MED ORDER — NITROGLYCERIN 0.4 MG SL SUBL: 0.4000 mg | SUBLINGUAL_TABLET | SUBLINGUAL | 5 refills | Status: DC | PRN

## 2019-06-26 NOTE — Patient Instructions (Signed)
Medication Instructions:  Your physician recommends that you continue on your current medications as directed. Please refer to the Current Medication list given to you today.  If you need a refill on your cardiac medications before your next appointment, please call your pharmacy.   Lab work: None If you have labs (blood work) drawn today and your tests are completely normal, you will receive your results only by: Marland Kitchen MyChart Message (if you have MyChart) OR . A paper copy in the mail If you have any lab test that is abnormal or we need to change your treatment, we will call you to review the results.  Testing/Procedures: None  Follow-Up:  You have been referred to the Lipid Clinic in our office.   At [facility], you and your health needs are our priority.  As part of our continuing mission to provide you with exceptional heart care, we have created designated Provider Care Teams.  These Care Teams include your primary Cardiologist (physician) and Advanced Practice Providers (APPs -  Physician Assistants and Nurse Practitioners) who all work together to provide you with the care you need, when you need it. You will need a follow up appointment in 4 months.  Please call our office 2 months in advance to schedule this appointment.  You may see Sinclair Grooms, MD or one of the following Advanced Practice Providers on your designated Care Team:   Truitt Merle, NP Cecilie Kicks, NP . Kathyrn Drown, NP  Any Other Special Instructions Will Be Listed Below (If Applicable).

## 2019-07-25 ENCOUNTER — Ambulatory Visit (INDEPENDENT_AMBULATORY_CARE_PROVIDER_SITE_OTHER): Payer: 59 | Admitting: Pharmacist

## 2019-07-25 ENCOUNTER — Other Ambulatory Visit: Payer: Self-pay

## 2019-07-25 VITALS — BP 104/72 | HR 66

## 2019-07-25 DIAGNOSIS — E785 Hyperlipidemia, unspecified: Secondary | ICD-10-CM

## 2019-07-25 NOTE — Progress Notes (Signed)
Patient ID: Devin Wright                 DOB: 1971-04-14                    MRN: [id number]     HPI: Devin Wright is a 48 y.o. male patient referred to lipid clinic by Dr. Tamala Julian. PMH is significant for CAD, MI w/ LAD stent (12/21/18), obesity, HLD, and HTN. During his last visit with Dr. Tamala Julian on 9/3, pt was experiencing cramping in lower extremities and toes, particularly in the morning. Lipids were checked and Lp(a) was elevated at 243.9. Pt was referred to lipid clinic to discuss PCSK9i therapy.  Patient presents today in good spirits. He reports compliance with his medications, is experiencing some mild muscle cramping with atorvastatin 80 mg. He is walking more frequently and isn't sure if he's staying hydrated enough. Patient is exercising and trying to eat a more low-carb and low-salt diet. Patient recently lost 30 pounds, goal weight is < 240 lbs, goal calorie intake < 2000 per day.   Current Medications: atorvastatin 80 mg daily   Intolerances: N/A  Risk Factors: CAD, MI w/ stent, HTN obesity, elevated Lp(a), family hx of heart disease   Patient-target LDL goal: < 80m/dL  Diet:  Drastically changed diet for his health - Eating more oatmeal, salads, chicken, and fish; less red meat, fried foods, and sugars; avoids fast food places  Exercise: Walks everyday   Family History: Aortic aneurysm in his father; COPD in his maternal grandfather; Diabetes in his paternal grandmother; Heart disease in his father and mother; Heart failure in his maternal grandfather; Hypertension in his father; Mother had 6 stents; Brother had an MI in his 274s Skin cancer in his maternal grandmother.  Social History: occassional drinker; not a tobacco user   Labs: 07/25/19: TC 126, TG 125, HDL 32, LDL 71, LDL-D 72 (atorvastatin 867mdaily) 06/24/19: TC 113, TG 87, HDL 30, LDL 66, Lp(a) 243.9 (atorvastatin 8062maily)  Past Medical History:  Diagnosis Date  . [place] heart [place]  . Family history of heart disease   . HTN (hypertension)   . Hyperlipidemia   . Palpitations   . STEMI ([place] elevation [place] infarction) [place]  PCI/DES to p/mLAD, 60% lcx, 40% ostial LM, and 60% PDA, EF 45%    Current Outpatient Medications on File Prior to Visit  Medication Sig Dispense Refill  . aspirin 81 MG chewable tablet Chew 1 tablet (81 mg total) by mouth daily. 90 tablet 3  . atorvastatin (LIPITOR) 80 MG tablet Take 1 tablet (80 mg total) by mouth daily at 6 PM. 90 tablet 3  . carvedilol (COREG) 6.25 MG tablet Take 1 tablet (6.25 mg total) by mouth 2 (two) times daily. 180 tablet 3  . losartan (COZAAR) 50 MG tablet Take 1 tablet (50 mg total) by mouth daily. 90 tablet 3  . nitroGLYCERIN (NITROSTAT) 0.4 MG SL tablet Place 1 tablet (0.4 mg total) under the tongue every 5 (five) minutes as needed for chest pain. 25 tablet 5  . spironolactone (ALDACTONE) 25 MG tablet Take 0.5 tablets (12.5 mg total) by mouth daily. 45 tablet 3  . ticagrelor (BRILINTA) 90 MG TABS tablet Take 1 tablet (90 mg total) by mouth 2 (two) times daily. 180 tablet 3   No current facility-administered medications on file prior to visit.     No Known Allergies  Assessment/Plan:  1.  Hyperlipidemia - Pt's LDL is above goal of < 86m/dL due to ASCVD and elevated Lp(a). Will submit prior authorization for Praluent 1579mmL Q2W (preferred PCSK9i on [facility]). Continue atorvastatin 80 mg daily, however if cramping becomes worse, can consider decreasing atorvastatin to 40 mg daily or changing to rosuvastatin 2031maily. Encouraged patient to continue to exercise, stay hydrated and eat a healthy diet. Rechecked LDL today - greater than 70 which is required for insurance PCSK9i coverage.  Follow-up lipid panel in 2-3 months.   Megan E. Supple, PharmD, BCACP, [id number]reMontrose-Ghent[place] [id number]: [phone]: [phone]/11/2018 3:37 PM    [employer] Evolocumab [id number] No: [id number] ID No: [id number]     Did subject meet all eligibility criteria?  Code  No Yes  10128dults age ? 40 years '[]'  '[x]'   One or both of the following:   102 A  Hospitalization for a clinical ASCVD event: acute (MI), UA, IS, or CLI within 18 months of enrollment        Note: Subjects must have been admitted to the hospital. Those       who are admitted and discharged in less than 24 hours are      Eligible for the study.Subjects who have been admitted to     The ER for a clinical ASCVD. '[]'  '[x]'   102B Coronary or peripheral revascularization including percutaneous or surgical revascularization in the past 18 months '[]'  '[x]'   One of the following:  103 A  LDL ? 70 mg/dL (1.81 mmol/L) with no plans for immediate initiation or titration of statin therapy '[]'  '[x]'   103 B Newly started on PCSK9i after the index hospitalization/procedure and prior to enrollment (but no more than 6 months prior to enrollment) with pre-PCSK9i treatment LDL-C value available and measured within 6 months of starting PCSK9i and known background LLT any time prior to PCSK9i initiation. '[x]'  '[]'   105 Planned follow-up within the health system '[]'  '[x]'   Subject does not meet any of the following exclusion criteria   201 Unable or unwilling to provide informed consent, including but not limited to cognitive or language barriers (reading or comprehension) '[x]'  '[]'   202 Lack of phone or email for contact '[x]'  '[]'   203 Evidence of end stage renal disease (ESRD) or stage 5 CKD '[x]'  '[]'   204 Anticipated life expectancy less than 6 month  '[x]'  '[]'   206  On a PCSK9i prior to their qualifying event '[x]'  '[]'      Subject Name: Devin R OmaSyrian Arab Republicubject met inclusion and exclusion criteria.  The informed consent form, study requirements and expectations were reviewed with the subject and questions and concerns were addressed prior to the signing of the consent form.  The subject verbalized understanding of the  trial requirements.  The subject agreed to participate in the CVMRoodhouseial and signed the informed consent at 2:30pm on10/02/20.  The informed consent was obtained prior to performance of any protocol-specific procedures for the subject.  A copy of the signed informed consent was given to the subject and a copy was placed in the subject's medical record.   Megan Supple   Socioeconomic Status - Baseline  Education Some High School or Less High School graduate Some College/University Graduated College/University or above '[]'  '[]'  '[]'  '[x]'    Marital Status Married Single Divorced Separated '[x]'  '[]'  '[]'  '[]'    Income <$15,000 $15,001 - $34,999 $35,000 - $74,999 $75,000 - $  99,999 >$100,000 '[]'  '[]'  '[]'  '[]'  '[x]'

## 2019-07-25 NOTE — Patient Instructions (Addendum)
Nice meeting you today!  Inject Praluent subcutaneously into the stomach once every two weeks. Store pens in the fridge.   Continue Atorvastatin 80 mg daily.  Call clinic if cramping becomes worse. [phone]  Continue exercising and eating a low-carb and low-salt diet. Stay hydrated and drink plenty of water.  Follow-up in lipid clinic in 2-3 months.

## 2019-07-26 LAB — LIPID PANEL
Chol/HDL Ratio: 3.9 ratio (ref 0.0–5.0)
Cholesterol, Total: 126 mg/dL (ref 100–199)
HDL: 32 mg/dL — ABNORMAL LOW (ref >39)
LDL Chol Calc (NIH): 71 mg/dL (ref 0–99)
Triglycerides: 125 mg/dL (ref 0–149)
VLDL Cholesterol Cal: 23 mg/dL (ref 5–40)

## 2019-07-26 LAB — LDL CHOLESTEROL, DIRECT: LDL Direct: 72 mg/dL (ref 0–99)

## 2019-07-28 MED ORDER — PRALUENT 150 MG/ML ~~LOC~~ SOAJ: 1.0000 "pen " | SUBCUTANEOUS | 11 refills | Status: DC

## 2019-07-28 MED ORDER — REPATHA SURECLICK 140 MG/ML ~~LOC~~ SOAJ: 1.0000 "pen " | SUBCUTANEOUS | 11 refills | Status: DC

## 2019-09-15 ENCOUNTER — Other Ambulatory Visit: Payer: 59 | Admitting: *Deleted

## 2019-09-15 ENCOUNTER — Other Ambulatory Visit: Payer: Self-pay

## 2019-09-15 DIAGNOSIS — E785 Hyperlipidemia, unspecified: Secondary | ICD-10-CM

## 2019-09-16 LAB — LIPOPROTEIN A (LPA): Lipoprotein (a): 205.5 nmol/L — ABNORMAL HIGH (ref <75.0)

## 2019-09-16 LAB — LIPID PANEL
Chol/HDL Ratio: 2.1 ratio (ref 0.0–5.0)
Cholesterol, Total: 78 mg/dL — ABNORMAL LOW (ref 100–199)
HDL: 38 mg/dL — ABNORMAL LOW (ref >39)
LDL Chol Calc (NIH): 21 mg/dL (ref 0–99)
Triglycerides: 100 mg/dL (ref 0–149)
VLDL Cholesterol Cal: 19 mg/dL (ref 5–40)

## 2019-11-27 ENCOUNTER — Other Ambulatory Visit: Payer: Self-pay | Admitting: Interventional Cardiology

## 2019-11-27 MED ORDER — TICAGRELOR 90 MG PO TABS: 90.0000 mg | ORAL_TABLET | Freq: Two times a day (BID) | ORAL | 2 refills | Status: DC

## 2019-11-27 MED ORDER — CARVEDILOL 6.25 MG PO TABS: 6.2500 mg | ORAL_TABLET | Freq: Two times a day (BID) | ORAL | 2 refills | Status: DC

## 2019-11-27 MED ORDER — ASPIRIN 81 MG PO CHEW: 81.0000 mg | CHEWABLE_TABLET | Freq: Every day | ORAL | 2 refills | Status: DC

## 2019-11-27 MED ORDER — NITROGLYCERIN 0.4 MG SL SUBL: 0.4000 mg | SUBLINGUAL_TABLET | SUBLINGUAL | 1 refills | Status: DC | PRN

## 2019-11-27 MED ORDER — ATORVASTATIN CALCIUM 80 MG PO TABS: 80.0000 mg | ORAL_TABLET | Freq: Every day | ORAL | 2 refills | Status: DC

## 2019-11-27 MED ORDER — SPIRONOLACTONE 25 MG PO TABS: 12.5000 mg | ORAL_TABLET | Freq: Every day | ORAL | 2 refills | Status: DC

## 2019-11-27 MED ORDER — LOSARTAN POTASSIUM 50 MG PO TABS: 50.0000 mg | ORAL_TABLET | Freq: Every day | ORAL | 2 refills | Status: DC

## 2020-02-18 NOTE — Progress Notes (Signed)
Cardiology Office Note:    Date:  02/19/2020   ID:  Devin Wright, DOB April 19, 1971, MRN [id number]  PCP:  Devin Brunette, MD  Cardiologist:  Devin Noe, MD   Referring MD: Devin Brunette, MD   Chief Complaint  Patient presents with  . Coronary Artery Disease    History of Present Illness:    Devin Wright is a 49 y.o. male with a hx of coronary artery disease initially presenting as an acute anterior myocardial infarction December 21, 2018 treated with LAD stent. Other medical problems include moderate obesity, hyperlipidemia, and elevated blood pressure.  Mr.Devin Wright voices no complaints concerning angina, dyspnea, and exertional tolerance.  He was not able to attend cardiac rehab.  He has questions concerning his upper limit of the effort to remain safe.  He wants to exercise more strenuously to help him lose weight.  He denies orthopnea, PND, nitroglycerin use, bleeding, and angina.  Past Medical History:  Diagnosis Date  . Acute systolic (congestive) heart failure (HCC)   . Family history of heart disease   . HTN (hypertension)   . Hyperlipidemia   . Palpitations   . STEMI (ST elevation myocardial infarction) (HCC)    PCI/DES to p/mLAD, 60% lcx, 40% ostial LM, and 60% PDA, EF 45%    Past Surgical History:  Procedure Laterality Date  . CORONARY/GRAFT ACUTE MI REVASCULARIZATION N/A 12/21/2018   Procedure: Coronary/Graft Acute MI Revascularization;  Surgeon: Lyn Records, MD;  Location: [facility] INVASIVE CV LAB;  Service: Cardiovascular;  Laterality: N/A;  . LEFT HEART CATH AND CORONARY ANGIOGRAPHY N/A 12/21/2018   Procedure: LEFT HEART CATH AND CORONARY ANGIOGRAPHY;  Surgeon: Lyn Records, MD;  Location: [facility] INVASIVE CV LAB;  Service: Cardiovascular;  Laterality: N/A;    Current Medications: Current Meds  Medication Sig  . Alirocumab (PRALUENT) 150 MG/ML SOAJ Inject 1 pen into the skin every 14 (fourteen) days.  Marland Kitchen atorvastatin (LIPITOR) 80 MG tablet Take 1 tablet (80 mg  total) by mouth daily at 6 PM.  . carvedilol (COREG) 6.25 MG tablet Take 1 tablet (6.25 mg total) by mouth 2 (two) times daily.  Marland Kitchen losartan (COZAAR) 50 MG tablet Take 1 tablet (50 mg total) by mouth daily.  . nitroGLYCERIN (NITROSTAT) 0.4 MG SL tablet Place 1 tablet (0.4 mg total) under the tongue every 5 (five) minutes as needed for chest pain.  Marland Kitchen spironolactone (ALDACTONE) 25 MG tablet Take 0.5 tablets (12.5 mg total) by mouth daily.  . [DISCONTINUED] aspirin 81 MG chewable tablet Chew 1 tablet (81 mg total) by mouth daily.  . [DISCONTINUED] ticagrelor (BRILINTA) 90 MG TABS tablet Take 1 tablet (90 mg total) by mouth 2 (two) times daily.     Allergies:   Patient has no known allergies.   Social History   Socioeconomic History  . Marital status: Married    Spouse name: Not on file  . Number of children: Not on file  . Years of education: Not on file  . Highest education level: Not on file  Occupational History  . Not on file  Tobacco Use  . Smoking status: Never Smoker  . Smokeless tobacco: Never Used  Substance and Sexual Activity  . Alcohol use: Not on file  . Drug use: Not on file  . Sexual activity: Not on file  Other Topics Concern  . Not on file  Social History Narrative  . Not on file   Social Determinants of Health   Financial Resource Strain:   .  Difficulty of Paying Living Expenses:   Food Insecurity:   . Worried About Charity fundraiser in the Last Year:   . Arboriculturist in the Last Year:   Transportation Needs:   . Film/video editor (Medical):   Marland Kitchen Lack of Transportation (Non-Medical):   Physical Activity:   . Days of Exercise per Week:   . Minutes of Exercise per Session:   Stress:   . Feeling of Stress :   Social Connections:   . Frequency of Communication with Friends and Family:   . Frequency of Social Gatherings with Friends and Family:   . Attends Religious Services:   . Active Member of Clubs or Organizations:   . Attends Theatre manager Meetings:   Marland Kitchen Marital Status:      Family History: The patient's family history includes Aortic aneurysm in his father; COPD in his maternal grandfather; Diabetes in his paternal grandmother; Heart disease in his father and mother; Heart failure in his maternal grandfather; Hypertension in his father; Skin cancer in his maternal grandmother.  ROS:   Please see the history of present illness.    Concerns about his weight.  Has picked up some weight and he feels he needs more rigorous exercise to help control.  All other systems reviewed and are negative.  EKGs/Labs/Other Studies Reviewed:    The following studies were reviewed today:  Most recent LDL was 23.  Total cholesterol was 83. Last echocardiogram in August revealed an EF of 50 to 55%. IMPRESSIONS    1. The left ventricle has a visually estimated ejection fraction of 50%.  The cavity size was normal. There is mildly increased left ventricular  wall thickness. Left ventricular diastolic parameters were normal. Mid  anteroseptal and apical septal  akinesis.  2. The aortic valve is tricuspid. No stenosis of the aortic valve.  3. The aorta is abnormal in size and structure.  4. There is mild dilatation of the aortic root measuring 39 mm.  5. The right ventricle has normal systolic function. The cavity was  normal. There is no increase in right ventricular wall thickness.  6. No evidence of mitral valve stenosis. No significant mitral  regurgitation.  7. The IVC was normal in size. No complete TR doppler jet so unable to  estimate PA systolic pressure.   EKG:  EKG large anterior infarct based on Q wave pattern V1 through V5.  Normal sinus rhythm.  Recent Labs: 06/24/2019: ALT 45; BUN 14; Creatinine, Ser 1.05; Potassium 4.3; Sodium 137  Recent Lipid Panel    Component Value Date/Time   CHOL 78 (L) 09/15/2019 0838   TRIG 100 09/15/2019 0838   HDL 38 (L) 09/15/2019 0838   CHOLHDL 2.1 09/15/2019 0838    CHOLHDL 6.9 12/21/2018 1120   VLDL 38 12/21/2018 1120   LDLCALC 21 09/15/2019 0838   LDLDIRECT 72 07/25/2019 1412    Physical Exam:    VS:  BP 108/68   Pulse 74   Ht 6\' 2"  (1.88 m)   Wt 258 lb 6.4 oz (117.2 kg)   SpO2 96%   BMI 33.18 kg/m     Wt Readings from Last 3 Encounters:  02/19/20 258 lb 6.4 oz (117.2 kg)  06/26/19 247 lb (112 kg)  01/23/19 258 lb 11.2 oz (117.3 kg)     GEN: Moderate obesity. No acute distress HEENT: Normal NECK: No JVD. LYMPHATICS: No lymphadenopathy CARDIAC:  RRR without murmur, gallop, or edema. VASCULAR:  Normal Pulses.  No bruits. RESPIRATORY:  Clear to auscultation without rales, wheezing or rhonchi  ABDOMEN: Soft, non-tender, non-distended, No pulsatile mass, MUSCULOSKELETAL: No deformity  SKIN: Warm and dry NEUROLOGIC:  Alert and oriented x 3 PSYCHIATRIC:  Normal affect   ASSESSMENT:    1. Coronary artery disease involving native coronary artery of native heart without angina pectoris   2. Chronic combined systolic and diastolic HF (heart failure) (HCC)   3. Aortic root dilatation (HCC)   4. Other hyperlipidemia   5. Essential hypertension    PLAN:    In order of problems listed above:  1. Secondary prevention measures discussed in detail.  Stop Brilinta and aspirin after starting Plavix 300 mg/day for the first dose then 75 mg/day thereafter. 2. No signs of volume overload.  LVEF 50% when last evaluated 3. Aortic root enlargement was noted by CT imaging to be a false alarm. 4. LDL target should be less than 55.  We will decrease atorvastatin to 40 mg/day.  Lipids will be checked again later this year. 5. He has been vaccinated.  He is practicing social distancing.  Overall education and awareness concerning primary/secondary risk prevention was discussed in detail: LDL less than 70, hemoglobin A1c less than 7, blood pressure target less than 130/80 mmHg, >150 minutes of moderate aerobic activity per week, avoidance of smoking,  weight control (via diet and exercise), and continued surveillance/management of/for obstructive sleep apnea.    Medication Adjustments/Labs and Tests Ordered: Current medicines are reviewed at length with the patient today.  Concerns regarding medicines are outlined above.  Orders Placed This Encounter  Procedures  . EKG 12-Lead   Meds ordered this encounter  Medications  . DISCONTD: clopidogrel (PLAVIX) 75 MG tablet    Sig: With your last dose of Brilinta, take 4 tablets of Clopidogrel by mouth.  Take one tablet by mouth daily thereafter.    Dispense:  94 tablet    Refill:  3  . clopidogrel (PLAVIX) 75 MG tablet    Sig: With your last dose of Brilinta, take 4 tablets of Clopidogrel by mouth.  Take one tablet by mouth daily thereafter.    Dispense:  94 tablet    Refill:  3    D/c Brilinta and ASA    Patient Instructions  Medication Instructions:  1) With your last dose of Brilinta, take 4 tablets (300mg  total) of Plavix.  After that, you will take just one tablet daily.  2) When you start Plavix, DISCONTINUE Brilinta and Aspirin.  *If you need a refill on your cardiac medications before your next appointment, please call your pharmacy*   Lab Work: None If you have labs (blood work) drawn today and your tests are completely normal, you will receive your results only by: MyChart Message (if you have MyChart) OR . A paper copy in the mail If you have any lab test that is abnormal or we need to change your treatment, we will call you to review the results.   Testing/Procedures: None   Follow-Up: At [facility], you and your health needs are our priority.  As part of our continuing mission to provide you with exceptional heart care, we have created designated Provider Care Teams.  These Care Teams include your primary Cardiologist (physician) and Advanced Practice Providers (APPs -  Physician Assistants and Nurse Practitioners) who all work together to provide you with  the care you need, when you need it.  We recommend signing up for the patient portal called "MyChart".  Sign up information is provided on this After Visit Summary.  MyChart is used to connect with patients for Virtual Visits (Telemedicine).  Patients are able to view lab/test results, encounter notes, upcoming appointments, etc.  Non-urgent messages can be sent to your provider as well.   To learn more about what you can do with MyChart, go to [email or web address].    Your next appointment:   12 month(s)  The format for your next appointment:   In Person  Provider:   You may see Devin Noe, MD or one of the following Advanced Practice Providers on your designated Care Team:    Norma Fredrickson, NP  Nada Boozer, NP  Georgie Chard, NP    Other Instructions      Signed, Devin Noe, MD  02/19/2020 3:42 PM    [facility]

## 2020-02-19 ENCOUNTER — Ambulatory Visit: Payer: 59 | Admitting: Interventional Cardiology

## 2020-02-19 ENCOUNTER — Encounter: Payer: Self-pay | Admitting: Interventional Cardiology

## 2020-02-19 ENCOUNTER — Other Ambulatory Visit: Payer: Self-pay

## 2020-02-19 VITALS — BP 108/68 | HR 74 | Ht 74.0 in | Wt 258.4 lb

## 2020-02-19 DIAGNOSIS — E7849 Other hyperlipidemia: Secondary | ICD-10-CM | POA: Diagnosis not present

## 2020-02-19 DIAGNOSIS — I2510 Atherosclerotic heart disease of native coronary artery without angina pectoris: Secondary | ICD-10-CM | POA: Diagnosis not present

## 2020-02-19 DIAGNOSIS — I77810 Thoracic aortic ectasia: Secondary | ICD-10-CM

## 2020-02-19 DIAGNOSIS — I5042 Chronic combined systolic (congestive) and diastolic (congestive) heart failure: Secondary | ICD-10-CM

## 2020-02-19 DIAGNOSIS — I10 Essential (primary) hypertension: Secondary | ICD-10-CM

## 2020-02-19 MED ORDER — CLOPIDOGREL BISULFATE 75 MG PO TABS: ORAL_TABLET | ORAL | 3 refills | Status: DC

## 2020-02-19 MED ORDER — ATORVASTATIN CALCIUM 40 MG PO TABS: 40.0000 mg | ORAL_TABLET | Freq: Every day | ORAL | 3 refills | Status: DC

## 2020-02-19 NOTE — Patient Instructions (Signed)
Medication Instructions:  1) With your last dose of Brilinta, take 4 tablets (300mg  total) of Plavix.  After that, you will take just one tablet daily.  2) When you start Plavix, DISCONTINUE Brilinta and Aspirin.  *If you need a refill on your cardiac medications before your next appointment, please call your pharmacy*   Lab Work: None If you have labs (blood work) drawn today and your tests are completely normal, you will receive your results only by: MyChart Message (if you have MyChart) OR . A paper copy in the mail If you have any lab test that is abnormal or we need to change your treatment, we will call you to review the results.   Testing/Procedures: None   Follow-Up: At [facility] you and your health needs are our priority.  As part of our continuing mission to provide you with exceptional heart care, we have created designated Provider Care Teams.  These Care Teams include your primary Cardiologist (physician) and Advanced Practice Providers (APPs -  Physician Assistants and Nurse Practitioners) who all work together to provide you with the care you need, when you need it.  We recommend signing up for the patient portal called "MyChart".  Sign up information is provided on this After Visit Summary.  MyChart is used to connect with patients for Virtual Visits (Telemedicine).  Patients are able to view lab/test results, encounter notes, upcoming appointments, etc.  Non-urgent messages can be sent to your provider as well.   To learn more about what you can do with MyChart, go to [facility].    Your next appointment:   12 month(s)  The format for your next appointment:   In Person  Provider:   You may see [email or web address], MD or one of the following Advanced Practice Providers on your designated Care Team:    Lesleigh Noe, NP  Norma Fredrickson, NP  Nada Boozer, NP    Other Instructions

## 2020-02-23 ENCOUNTER — Telehealth: Payer: Self-pay | Admitting: Pharmacist

## 2020-02-23 NOTE — Telephone Encounter (Signed)
[employer] pill pack calling stating patient has enough brillinta in pill pack until 6/16. Wants to know if ok to wait until then to switch to plavix. Spoke with Dr. Katrinka Blazing to confrim this is fine.

## 2020-04-29 ENCOUNTER — Other Ambulatory Visit: Payer: Self-pay | Admitting: Interventional Cardiology

## 2020-06-25 ENCOUNTER — Telehealth: Payer: Self-pay | Admitting: Pharmacist

## 2020-06-25 NOTE — Telephone Encounter (Signed)
Renewal PA submitted to [facility] for Praluent 150

## 2020-07-16 ENCOUNTER — Other Ambulatory Visit: Payer: Self-pay | Admitting: Pharmacist

## 2020-07-16 MED ORDER — PRALUENT 150 MG/ML ~~LOC~~ SOAJ: 1.0000 "pen " | SUBCUTANEOUS | 3 refills | Status: DC

## 2020-07-24 ENCOUNTER — Other Ambulatory Visit: Payer: Self-pay | Admitting: Interventional Cardiology

## 2020-08-22 ENCOUNTER — Other Ambulatory Visit: Payer: Self-pay | Admitting: Interventional Cardiology

## 2020-09-01 ENCOUNTER — Telehealth: Payer: Self-pay | Admitting: Interventional Cardiology

## 2020-09-01 NOTE — Telephone Encounter (Signed)
Called [facility] and let him know that PA is good through 06/25/21

## 2020-09-01 NOTE — Telephone Encounter (Signed)
° ° °  Pt c/o medication issue:  1. Name of Medication:   Alirocumab (PRALUENT) 150 MG/ML SOAJ     2. How are you currently taking this medication (dosage and times per day)?Inject 1 pen into the skin every 14 (fourteen) days.  3. Are you having a reaction (difficulty breathing--STAT)?   4. What is your medication issue? Hailey from [facility] called, she said they sent a prior auth for pt's praluent on the 11/8. She is calling to follow up

## 2020-09-08 DIAGNOSIS — Z0000 Encounter for general adult medical examination without abnormal findings: Secondary | ICD-10-CM | POA: Diagnosis not present

## 2020-09-08 DIAGNOSIS — Z23 Encounter for immunization: Secondary | ICD-10-CM | POA: Diagnosis not present

## 2020-09-08 DIAGNOSIS — Z79899 Other long term (current) drug therapy: Secondary | ICD-10-CM | POA: Diagnosis not present

## 2020-11-14 DIAGNOSIS — Z20822 Contact with and (suspected) exposure to covid-19: Secondary | ICD-10-CM | POA: Diagnosis not present

## 2020-11-14 DIAGNOSIS — U071 COVID-19: Secondary | ICD-10-CM | POA: Diagnosis not present

## 2020-12-06 ENCOUNTER — Other Ambulatory Visit: Payer: Self-pay

## 2020-12-06 MED ORDER — PRALUENT 150 MG/ML ~~LOC~~ SOAJ: 1.0000 "pen " | SUBCUTANEOUS | 3 refills | Status: DC

## 2020-12-06 MED ORDER — LOSARTAN POTASSIUM 50 MG PO TABS: 50.0000 mg | ORAL_TABLET | Freq: Every day | ORAL | 0 refills | Status: DC

## 2021-01-21 ENCOUNTER — Other Ambulatory Visit: Payer: Self-pay | Admitting: Interventional Cardiology

## 2021-01-24 ENCOUNTER — Other Ambulatory Visit: Payer: Self-pay | Admitting: Interventional Cardiology

## 2021-01-24 DIAGNOSIS — E785 Hyperlipidemia, unspecified: Secondary | ICD-10-CM

## 2021-01-24 DIAGNOSIS — I5042 Chronic combined systolic (congestive) and diastolic (congestive) heart failure: Secondary | ICD-10-CM

## 2021-01-26 DIAGNOSIS — Z0000 Encounter for general adult medical examination without abnormal findings: Secondary | ICD-10-CM | POA: Diagnosis not present

## 2021-01-26 DIAGNOSIS — I10 Essential (primary) hypertension: Secondary | ICD-10-CM | POA: Diagnosis not present

## 2021-01-26 DIAGNOSIS — E7800 Pure hypercholesterolemia, unspecified: Secondary | ICD-10-CM | POA: Diagnosis not present

## 2021-01-26 DIAGNOSIS — I2510 Atherosclerotic heart disease of native coronary artery without angina pectoris: Secondary | ICD-10-CM | POA: Diagnosis not present

## 2021-01-31 DIAGNOSIS — I2510 Atherosclerotic heart disease of native coronary artery without angina pectoris: Secondary | ICD-10-CM | POA: Diagnosis not present

## 2021-01-31 DIAGNOSIS — I10 Essential (primary) hypertension: Secondary | ICD-10-CM | POA: Diagnosis not present

## 2021-01-31 DIAGNOSIS — Z8669 Personal history of other diseases of the nervous system and sense organs: Secondary | ICD-10-CM | POA: Diagnosis not present

## 2021-01-31 DIAGNOSIS — I708 Atherosclerosis of other arteries: Secondary | ICD-10-CM | POA: Diagnosis not present

## 2021-01-31 DIAGNOSIS — Z0001 Encounter for general adult medical examination with abnormal findings: Secondary | ICD-10-CM | POA: Diagnosis not present

## 2021-02-12 ENCOUNTER — Other Ambulatory Visit: Payer: Self-pay | Admitting: Interventional Cardiology

## 2021-02-16 ENCOUNTER — Ambulatory Visit (INDEPENDENT_AMBULATORY_CARE_PROVIDER_SITE_OTHER): Payer: BC Managed Care – PPO | Admitting: Physician Assistant

## 2021-02-16 ENCOUNTER — Encounter: Payer: Self-pay | Admitting: Physician Assistant

## 2021-02-16 ENCOUNTER — Other Ambulatory Visit: Payer: Self-pay

## 2021-02-16 VITALS — BP 108/80 | HR 63 | Ht 74.0 in | Wt 277.6 lb

## 2021-02-16 DIAGNOSIS — I77810 Thoracic aortic ectasia: Secondary | ICD-10-CM | POA: Diagnosis not present

## 2021-02-16 DIAGNOSIS — E7849 Other hyperlipidemia: Secondary | ICD-10-CM | POA: Diagnosis not present

## 2021-02-16 DIAGNOSIS — I5042 Chronic combined systolic (congestive) and diastolic (congestive) heart failure: Secondary | ICD-10-CM

## 2021-02-16 DIAGNOSIS — I2510 Atherosclerotic heart disease of native coronary artery without angina pectoris: Secondary | ICD-10-CM | POA: Diagnosis not present

## 2021-02-16 DIAGNOSIS — I10 Essential (primary) hypertension: Secondary | ICD-10-CM

## 2021-02-16 NOTE — Progress Notes (Signed)
Cardiology Office Note:    Date:  02/16/2021   ID:  Devin Wright, DOB November 11, 1970, MRN [id number]  PCP:  Deland Pretty, MD  [facility] Cardiologist:  Sinclair Grooms, MD  [facility] Electrophysiologist:  None   Chief Complaint: Yearly follow-up for CAD  History of Present Illness:    Devin Wright is a 50 y.o. male with a hx of coronary artery disease secondary to acute anterior MI s/p LAD stent December 21, 2018, hypertension, ischemic cardiomyopathy improved LV function and hyperlipidemia presents for yearly follow-up.  LVEF of 40 to 45% March 2020 during MI.  Follow-up echocardiogram August 2020 showed LV function of 50%. He completed one year of DAPT with ASA and Brilitna then changed to Plavix only however he continued to take ASA with plavix.    Patient is here for follow-up.  He is planning to start personal training.  He walks 3 to 4 miles 3 times per week.  During initial walk he may felt little fatigue and shortness of breath >>then he continues to finish his walk.  He has no associated chest tightness.  He never required sublingual nitroglycerin or stopped during activity.  His symptoms are different than prior angina.  He denies palpitation, dizziness, orthopnea, PND, syncope, lower extremity edema or melena.    Past Medical History:  Diagnosis Date  . Acute systolic (congestive) heart failure ([place])   . Family history of heart disease   . HTN (hypertension)   . Hyperlipidemia   . Palpitations   . STEMI (ST elevation myocardial infarction) ([place])    PCI/DES to p/mLAD, 60% lcx, 40% ostial LM, and 60% PDA, EF 45%    Past Surgical History:  Procedure Laterality Date  . CORONARY/GRAFT ACUTE MI REVASCULARIZATION N/A 12/21/2018   Procedure: Coronary/Graft Acute MI Revascularization;  Surgeon: Belva Crome, MD;  Location: [place] CV LAB;  Service: Cardiovascular;  Laterality: N/A;  . LEFT HEART CATH AND CORONARY ANGIOGRAPHY N/A 12/21/2018   Procedure: LEFT HEART  CATH AND CORONARY ANGIOGRAPHY;  Surgeon: Belva Crome, MD;  Location: [place] CV LAB;  Service: Cardiovascular;  Laterality: N/A;    Current Medications: Current Meds  Medication Sig  . aspirin EC 81 MG tablet Take 81 mg by mouth daily. Swallow whole.     Allergies:   Patient has no known allergies.   Social History   Socioeconomic History  . Marital status: Married    Spouse name: Not on file  . Number of children: Not on file  . Years of education: Not on file  . Highest education level: Not on file  Occupational History  . Not on file  Tobacco Use  . Smoking status: Never Smoker  . Smokeless tobacco: Never Used  Substance and Sexual Activity  . Alcohol use: Not on file  . Drug use: Not on file  . Sexual activity: Not on file  Other Topics Concern  . Not on file  Social History Narrative  . Not on file   Social Determinants of Health   Financial Resource Strain: Not on file  Food Insecurity: Not on file  Transportation Needs: Not on file  Physical Activity: Not on file  Stress: Not on file  Social Connections: Not on file     Family History: The patient's family history includes Aortic aneurysm in his father; COPD in his maternal grandfather; Diabetes in his paternal grandmother; Heart disease in his father and mother; Heart failure in his maternal grandfather; Hypertension  in his father; Skin cancer in his maternal grandmother.    ROS:   Please see the history of present illness.    All other systems reviewed and are negative.  EKGs/Labs/Other Studies Reviewed:    The following studies were reviewed today:  Echo 05/29/2019 1. The left ventricle has a visually estimated ejection fraction of 50%.  The cavity size was normal. There is mildly increased left ventricular  wall thickness. Left ventricular diastolic parameters were normal. Mid  anteroseptal and apical septal  akinesis.  2. The aortic valve is tricuspid. No stenosis of the aortic valve.   3. The aorta is abnormal in size and structure.  4. There is mild dilatation of the aortic root measuring 39 mm.  5. The right ventricle has normal systolic function. The cavity was  normal. There is no increase in right ventricular wall thickness.  6. No evidence of mitral valve stenosis. No significant mitral  regurgitation.  7. The IVC was normal in size. No complete TR doppler jet so unable to  estimate PA systolic pressure.  Coronary/Graft Acute MI Revascularization  12/21/2018  LEFT HEART CATH AND CORONARY ANGIOGRAPHY    Conclusion    A stent was successfully placed.    Anterior ST elevation myocardial infarction commencing at 10:45 AM  Eccentric 40% ostial left main  Total occlusion proximal to mid LAD treated with overlapping Onyx 3.0 mm stents deployed at 14 atm reducing 100% stenosis to 0% with TIMI grade III flow.  First diagonal contains 90% mid vessel stenosis.  The diagonal is moderate in size.  50% ostial to proximal circumflex.  The circumflex supplies a small territory.  RCA is large and contains diffuse luminal irregularities but no high-grade obstruction.  PDA contains ostial 60% narrowing  Apical severe hypokinesis with EF 40%.  LVEDP 28 mmHg.  RECOMMENDATIONS:   Dual antiplatelet therapy x12 months  Aggressive risk factor modification  Beta-blocker and ARB therapy  Clinical course will determine discharge but could potentially be discharged as early as 48 hours if no complications.  Diagnostic Dominance: Right    Intervention      EKG:  EKG is ordered today.  The ekg ordered today demonstrates normal sinus rhythm at rate of 63 bpm  Recent Labs: No results found for requested labs within last 8760 hours.  Recent Lipid Panel    Component Value Date/Time   CHOL 78 (L) 09/15/2019 0838   TRIG 100 09/15/2019 0838   HDL 38 (L) 09/15/2019 0838   CHOLHDL 2.1 09/15/2019 0838   CHOLHDL 6.9 12/21/2018 1120   VLDL 38 12/21/2018  1120   LDLCALC 21 09/15/2019 0838   LDLDIRECT 72 07/25/2019 1412    Reviewed complete metabolic panel and lipid panel done by PCP. Normal creatinine and potassium LDL 32 HDL 35 Triglyceride 121  Physical Exam:    VS:  BP 108/80   Pulse 63   Ht '6\' 2"'  (1.88 m)   Wt 277 lb 9.6 oz (125.9 kg)   SpO2 98%   BMI 35.64 kg/m     Wt Readings from Last 3 Encounters:  02/16/21 277 lb 9.6 oz (125.9 kg)  02/19/20 258 lb 6.4 oz (117.2 kg)  06/26/19 247 lb (112 kg)     GEN: Well nourished, well developed in no acute distress HEENT: Normal NECK: No JVD; No carotid bruits LYMPHATICS: No lymphadenopathy CARDIAC: RRR, no murmurs, rubs, gallops RESPIRATORY:  Clear to auscultation without rales, wheezing or rhonchi  ABDOMEN: Soft, non-tender, non-distended MUSCULOSKELETAL:  No  edema; No deformity  SKIN: Warm and dry NEUROLOGIC:  Alert and oriented x 3 PSYCHIATRIC:  Normal affect   ASSESSMENT AND PLAN:    1. CAD s/p DES to LAD Patient is having fatigue and shortness of breath initially during walk but able to complete 3 to 4 miles of walking multiple times per week.  This symptoms is different than prior angina when had MI.  He is planning to start personal training.  I have recommended if he has symptoms reminiscent of prior MI he needs to call us otherwise he can start training.  Discussed exercise Myoview but will defer until next evaluation.  Continue aspirin and Plavix until next office visit.  Continue statin and beta-blocker.  2.  ICM with improved LVEF Euvolemic without CHF symptoms.  Continue Coreg, Aldactone and losartan.  Renal function and potassium were normal on recent lab work.  3. HTN Blood pressure excellently controlled on current medication.  No change.  4. HLD LDL 32 and HDL 35 on recent blood work.  Continue Lipitor and Praluent.   Medication Adjustments/Labs and Tests Ordered: Current medicines are reviewed at length with the patient today.  Concerns regarding  medicines are outlined above.  Orders Placed This Encounter  Procedures  . EKG 12-Lead   No orders of the defined types were placed in this encounter.   Patient Instructions  Medication Instructions:  Your physician recommends that you continue on your current medications as directed. Please refer to the Current Medication list given to you today.  *If you need a refill on your cardiac medications before your next appointment, please call your pharmacy*   Lab Work: None ordered  If you have labs (blood work) drawn today and your tests are completely normal, you will receive your results only by: Marland Kitchen MyChart Message (if you have MyChart) OR . A paper copy in the mail If you have any lab test that is abnormal or we need to change your treatment, we will call you to review the results.   Testing/Procedures: None ordered   Follow-Up: At [facility], you and your health needs are our priority.  As part of our continuing mission to provide you with exceptional heart care, we have created designated Provider Care Teams.  These Care Teams include your primary Cardiologist (physician) and Advanced Practice Providers (APPs -  Physician Assistants and Nurse Practitioners) who all work together to provide you with the care you need, when you need it.  We recommend signing up for the patient portal called "MyChart".  Sign up information is provided on this After Visit Summary.  MyChart is used to connect with patients for Virtual Visits (Telemedicine).  Patients are able to view lab/test results, encounter notes, upcoming appointments, etc.  Non-urgent messages can be sent to your provider as well.   To learn more about what you can do with MyChart, go to [email or web address].    Your next appointment:   3-4  month(s)  The format for your next appointment:   In Person  Provider:   You may see Sinclair Grooms, MD or one of the following Advanced Practice Providers on your designated  Care Team:    Kathyrn Drown, NP    Other Instructions      Signed, Leanor Kail, [place]  02/16/2021 8:49 AM    [place]

## 2021-02-16 NOTE — Patient Instructions (Addendum)
Medication Instructions:  Your physician recommends that you continue on your current medications as directed. Please refer to the Current Medication list given to you today.  *If you need a refill on your cardiac medications before your next appointment, please call your pharmacy*   Lab Work: None ordered  If you have labs (blood work) drawn today and your tests are completely normal, you will receive your results only by: Marland Kitchen MyChart Message (if you have MyChart) OR . A paper copy in the mail If you have any lab test that is abnormal or we need to change your treatment, we will call you to review the results.   Testing/Procedures: None ordered   Follow-Up: At [facility], you and your health needs are our priority.  As part of our continuing mission to provide you with exceptional heart care, we have created designated Provider Care Teams.  These Care Teams include your primary Cardiologist (physician) and Advanced Practice Providers (APPs -  Physician Assistants and Nurse Practitioners) who all work together to provide you with the care you need, when you need it.  We recommend signing up for the patient portal called "MyChart".  Sign up information is provided on this After Visit Summary.  MyChart is used to connect with patients for Virtual Visits (Telemedicine).  Patients are able to view lab/test results, encounter notes, upcoming appointments, etc.  Non-urgent messages can be sent to your provider as well.   To learn more about what you can do with MyChart, go to [email or web address].    Your next appointment:   3-4  month(s)  The format for your next appointment:   In Person  Provider:   You may see Lesleigh Noe, MD or one of the following Advanced Practice Providers on your designated Care Team:    Georgie Chard, NP    Other Instructions

## 2021-02-23 ENCOUNTER — Other Ambulatory Visit: Payer: Self-pay | Admitting: Interventional Cardiology

## 2021-02-26 ENCOUNTER — Other Ambulatory Visit: Payer: Self-pay | Admitting: Interventional Cardiology

## 2021-03-20 ENCOUNTER — Other Ambulatory Visit: Payer: Self-pay | Admitting: Interventional Cardiology

## 2021-04-28 DIAGNOSIS — J019 Acute sinusitis, unspecified: Secondary | ICD-10-CM | POA: Diagnosis not present

## 2021-04-28 DIAGNOSIS — R051 Acute cough: Secondary | ICD-10-CM | POA: Diagnosis not present

## 2021-06-22 NOTE — Progress Notes (Signed)
Cardiology Office Note:    Date:  06/23/2021   ID:  Devin Wright, DOB 1971/03/28, MRN [id number]  PCP:  Merri Brunette, MD  Cardiologist:  Lesleigh Noe, MD   Referring MD: Merri Brunette, MD   Chief Complaint  Patient presents with   Coronary Artery Disease     History of Present Illness:    Devin Wright is a 50 y.o. male with a hx of coronary artery disease initially presenting as an acute anterior myocardial infarction December 21, 2018 treated with LAD stent.  Other medical problems include moderate obesity, hyperlipidemia, and elevated blood pressure.   Is doing well.  He is apologetic for having no real desire to exercise or eat right.  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  When he gets home he is tired.  Traveling does not allow him to eat properly.  On other hand he has no specific cardiac complaints.  Denies orthopnea, PND, or chest pain.  He has gained 40 pounds in 2 years.  He has started snoring some.  Dr. Renne Crigler is planning on a sleep study.  He denies claudication.  Does not smoke.  He is compliant with his medication regimen.  Past Medical History:  Diagnosis Date   Acute systolic (congestive) heart failure (HCC)    Family history of heart disease    HTN (hypertension)    Hyperlipidemia    Palpitations    STEMI (ST elevation myocardial infarction) (HCC)    PCI/DES to p/mLAD, 60% lcx, 40% ostial LM, and 60% PDA, EF 45%    Past Surgical History:  Procedure Laterality Date   CORONARY/GRAFT ACUTE MI REVASCULARIZATION N/A 12/21/2018   Procedure: Coronary/Graft Acute MI Revascularization;  Surgeon: Lyn Records, MD;  [facility];  Service: Cardiovascular;  Laterality: N/A;   LEFT HEART CATH AND CORONARY ANGIOGRAPHY N/A 12/21/2018   Procedure: LEFT HEART CATH AND CORONARY ANGIOGRAPHY;  Surgeon: Lyn Records, MD;  Location: [facility];  Service: Cardiovascular;  Laterality:  N/A;    Current Medications: Current Meds  Medication Sig   atorvastatin (LIPITOR) 40 MG tablet Take 1 tablet by mouth daily.   carvedilol (COREG) 6.25 MG tablet Take 1 tablet by mouth twice daily.   clopidogrel (PLAVIX) 75 MG tablet Take 1 tablet by mouth daily.   losartan (COZAAR) 50 MG tablet Take 1 tablet by mouth daily.   nitroGLYCERIN (NITROSTAT) 0.4 MG SL tablet Place 1 tablet (0.4 mg total) under the tongue every 5 (five) minutes as needed for chest pain.   PRALUENT 150 MG/ML SOAJ Inject 1 pen subcutaneously every 14 days.   sildenafil (REVATIO) 20 MG tablet Take 40-100 mg by mouth daily as needed.   spironolactone (ALDACTONE) 25 MG tablet Take 1/2 tablet by mouth daily.     Allergies:   Patient has no known allergies.   Social History   Socioeconomic History   Marital status: Married    Spouse name: Not on file   Number of children: Not on file   Years of education: Not on file   Highest education level: Not on file  Occupational History   Not on file  Tobacco Use   Smoking status: Never   Smokeless tobacco: Never  Substance and Sexual Activity   Alcohol use: Not on file   Drug use: Not on file   Sexual activity: Not on file  Other Topics Concern   Not on  file  Social History Narrative   Not on file   Social Determinants of Health   Financial Resource Strain: Not on file  Food Insecurity: Not on file  Transportation Needs: Not on file  Physical Activity: Not on file  Stress: Not on file  Social Connections: Not on file     Family History: The patient's family history includes Aortic aneurysm in his father; COPD in his maternal grandfather; Diabetes in his paternal grandmother; Heart disease in his father and mother; Heart failure in his maternal grandfather; Hypertension in his father; Skin cancer in his maternal grandmother.  ROS:   Please see the history of present illness.    He sells blood comment devices.  He is in [employer].  His job is largely  sedentary including a lot of traveling by car.  All other systems reviewed and are negative.  EKGs/Labs/Other Studies Reviewed:    The following studies were reviewed today: 2D Doppler echocardiogram 2020 IMPRESSIONS     1. The left ventricle has a visually estimated ejection fraction of 50%.  The cavity size was normal. There is mildly increased left ventricular  wall thickness. Left ventricular diastolic parameters were normal. Mid  anteroseptal and apical septal  akinesis.   2. The aortic valve is tricuspid. No stenosis of the aortic valve.   3. The aorta is abnormal in size and structure.   4. There is mild dilatation of the aortic root measuring 39 mm.   5. The right ventricle has normal systolic function. The cavity was  normal. There is no increase in right ventricular wall thickness.   6. No evidence of mitral valve stenosis. No significant mitral  regurgitation.   7. The IVC was normal in size. No complete TR doppler jet so unable to  estimate PA systolic pressure.   EKG:  EKG did not get repeated today.  In April, sinus bradycardia, left atrial abnormality, and extensive anterior Q waves were noted and unchanged from prior tracing.  Recent Labs: No results found for requested labs within last 8760 hours.  Recent Lipid Panel    Component Value Date/Time   CHOL 78 (L) 09/15/2019 0838   TRIG 100 09/15/2019 0838   HDL 38 (L) 09/15/2019 0838   CHOLHDL 2.1 09/15/2019 0838   CHOLHDL 6.9 12/21/2018 1120   VLDL 38 12/21/2018 1120   LDLCALC 21 09/15/2019 0838   LDLDIRECT 72 07/25/2019 1412    Physical Exam:    VS:  BP 102/70   Pulse 76   Ht 6\' 2"  (1.88 m)   Wt 281 lb 3.2 oz (127.6 kg)   SpO2 99%   BMI 36.10 kg/m     Wt Readings from Last 3 Encounters:  06/23/21 281 lb 3.2 oz (127.6 kg)  02/16/21 277 lb 9.6 oz (125.9 kg)  02/19/20 258 lb 6.4 oz (117.2 kg)     GEN: Morbid obesity. No acute distress HEENT: Normal NECK: No JVD. LYMPHATICS: No  lymphadenopathy CARDIAC: No murmur. RRR S4 gallop but no edema. VASCULAR:  Normal Pulses. No bruits. RESPIRATORY:  Clear to auscultation without rales, wheezing or rhonchi  ABDOMEN: Soft, non-tender, non-distended, No pulsatile mass, MUSCULOSKELETAL: No deformity  SKIN: Warm and dry NEUROLOGIC:  Alert and oriented x 3 PSYCHIATRIC:  Normal affect   ASSESSMENT:    1. Coronary artery disease involving native coronary artery of native heart without angina pectoris   2. Aortic root dilatation (HCC)   3. Chronic combined systolic and diastolic HF (heart failure) (HCC)  4. Other hyperlipidemia   5. Essential hypertension    PLAN:    In order of problems listed above:  Secondary prevention discussed.  Weight loss and aerobic activity are at the top of the list. He does not have aortic root enlargement based upon repeat echo study. 2D Doppler echocardiogram to reassess LV systolic function.  Continue carvedilol and Cozaar.  If LV function declining, consider switching to [place] and adding SGLT2. Continue evolocumab and Lipitor.  Most recent LDL 32.  Continue same intensity of therapy Needs repeat echocardiogram.  To reassess aortic size.  Overall education and awareness concerning secondary risk prevention was discussed in detail: LDL less than 70, hemoglobin A1c less than 7, blood pressure target less than 130/80 mmHg, >150 minutes of moderate aerobic activity per week, avoidance of smoking, weight control (via diet and exercise), and continued surveillance/management of/for obstructive sleep apnea.    Medication Adjustments/Labs and Tests Ordered: Current medicines are reviewed at length with the patient today.  Concerns regarding medicines are outlined above.  Orders Placed This Encounter  Procedures   ECHOCARDIOGRAM COMPLETE    No orders of the defined types were placed in this encounter.   Patient Instructions  Medication Instructions:  1) DISCONTINUE Aspirin  *If you need a  refill on your cardiac medications before your next appointment, please call your pharmacy*   Lab Work: None If you have labs (blood work) drawn today and your tests are completely normal, you will receive your results only by: MyChart Message (if you have MyChart) OR A paper copy in the mail If you have any lab test that is abnormal or we need to change your treatment, we will call you to review the results.   Testing/Procedures: Your physician has requested that you have an echocardiogram before the year is out. Echocardiography is a painless test that uses sound waves to create images of your heart. It provides your doctor with information about the size and shape of your heart and how well your heart's chambers and valves are working. This procedure takes approximately one hour. There are no restrictions for this procedure.   Follow-Up: At [facility], you and your health needs are our priority.  As part of our continuing mission to provide you with exceptional heart care, we have created designated Provider Care Teams.  These Care Teams include your primary Cardiologist (physician) and Advanced Practice Providers (APPs -  Physician Assistants and Nurse Practitioners) who all work together to provide you with the care you need, when you need it.  We recommend signing up for the patient portal called "MyChart".  Sign up information is provided on this After Visit Summary.  MyChart is used to connect with patients for Virtual Visits (Telemedicine).  Patients are able to view lab/test results, encounter notes, upcoming appointments, etc.  Non-urgent messages can be sent to your provider as well.   To learn more about what you can do with MyChart, go to [email or web address].    Your next appointment:   1 year(s)  The format for your next appointment:   In Person  Provider:   You may see Lesleigh Noe, MD or one of the following Advanced Practice Providers on your designated Care  Team:   Nada Boozer, NP    Other Instructions     Signed, Lesleigh Noe, MD  06/23/2021 4:09 PM    [facility]

## 2021-06-23 ENCOUNTER — Encounter: Payer: Self-pay | Admitting: Interventional Cardiology

## 2021-06-23 ENCOUNTER — Other Ambulatory Visit: Payer: Self-pay

## 2021-06-23 ENCOUNTER — Ambulatory Visit (INDEPENDENT_AMBULATORY_CARE_PROVIDER_SITE_OTHER): Payer: BC Managed Care – PPO | Admitting: Interventional Cardiology

## 2021-06-23 VITALS — BP 102/70 | HR 76 | Ht 74.0 in | Wt 281.2 lb

## 2021-06-23 DIAGNOSIS — I10 Essential (primary) hypertension: Secondary | ICD-10-CM

## 2021-06-23 DIAGNOSIS — I5042 Chronic combined systolic (congestive) and diastolic (congestive) heart failure: Secondary | ICD-10-CM | POA: Diagnosis not present

## 2021-06-23 DIAGNOSIS — I77810 Thoracic aortic ectasia: Secondary | ICD-10-CM

## 2021-06-23 DIAGNOSIS — I2510 Atherosclerotic heart disease of native coronary artery without angina pectoris: Secondary | ICD-10-CM

## 2021-06-23 DIAGNOSIS — E785 Hyperlipidemia, unspecified: Secondary | ICD-10-CM

## 2021-06-23 DIAGNOSIS — E7849 Other hyperlipidemia: Secondary | ICD-10-CM | POA: Diagnosis not present

## 2021-06-23 NOTE — Patient Instructions (Signed)
Medication Instructions:  1) DISCONTINUE Aspirin  *If you need a refill on your cardiac medications before your next appointment, please call your pharmacy*   Lab Work: None If you have labs (blood work) drawn today and your tests are completely normal, you will receive your results only by: MyChart Message (if you have MyChart) OR A paper copy in the mail If you have any lab test that is abnormal or we need to change your treatment, we will call you to review the results.   Testing/Procedures: Your physician has requested that you have an echocardiogram before the year is out. Echocardiography is a painless test that uses sound waves to create images of your heart. It provides your doctor with information about the size and shape of your heart and how well your heart's chambers and valves are working. This procedure takes approximately one hour. There are no restrictions for this procedure.   Follow-Up: At [facility], you and your health needs are our priority.  As part of our continuing mission to provide you with exceptional heart care, we have created designated Provider Care Teams.  These Care Teams include your primary Cardiologist (physician) and Advanced Practice Providers (APPs -  Physician Assistants and Nurse Practitioners) who all work together to provide you with the care you need, when you need it.  We recommend signing up for the patient portal called "MyChart".  Sign up information is provided on this After Visit Summary.  MyChart is used to connect with patients for Virtual Visits (Telemedicine).  Patients are able to view lab/test results, encounter notes, upcoming appointments, etc.  Non-urgent messages can be sent to your provider as well.   To learn more about what you can do with MyChart, go to [email or web address].    Your next appointment:   1 year(s)  The format for your next appointment:   In Person  Provider:   You may see Lesleigh Noe, MD or one  of the following Advanced Practice Providers on your designated Care Team:   Nada Boozer, NP    Other Instructions

## 2021-07-23 ENCOUNTER — Other Ambulatory Visit: Payer: Self-pay | Admitting: Interventional Cardiology

## 2021-07-23 DIAGNOSIS — I5042 Chronic combined systolic (congestive) and diastolic (congestive) heart failure: Secondary | ICD-10-CM

## 2021-08-18 ENCOUNTER — Other Ambulatory Visit: Payer: Self-pay | Admitting: Interventional Cardiology

## 2021-08-22 ENCOUNTER — Other Ambulatory Visit: Payer: Self-pay | Admitting: Interventional Cardiology

## 2021-08-29 ENCOUNTER — Other Ambulatory Visit: Payer: Self-pay

## 2021-08-29 ENCOUNTER — Ambulatory Visit (HOSPITAL_COMMUNITY): Payer: BC Managed Care – PPO | Attending: Cardiology

## 2021-08-29 DIAGNOSIS — I5042 Chronic combined systolic (congestive) and diastolic (congestive) heart failure: Secondary | ICD-10-CM | POA: Diagnosis not present

## 2021-08-29 LAB — ECHOCARDIOGRAM COMPLETE
Area-P 1/2: 4.08 cm2
S' Lateral: 2.9 cm

## 2021-11-16 ENCOUNTER — Other Ambulatory Visit: Payer: Self-pay | Admitting: Interventional Cardiology

## 2021-12-16 ENCOUNTER — Other Ambulatory Visit: Payer: Self-pay | Admitting: Interventional Cardiology

## 2021-12-16 DIAGNOSIS — E785 Hyperlipidemia, unspecified: Secondary | ICD-10-CM

## 2021-12-28 DIAGNOSIS — Z0000 Encounter for general adult medical examination without abnormal findings: Secondary | ICD-10-CM | POA: Diagnosis not present

## 2022-01-30 DIAGNOSIS — Z0000 Encounter for general adult medical examination without abnormal findings: Secondary | ICD-10-CM | POA: Diagnosis not present

## 2022-02-02 DIAGNOSIS — Z0000 Encounter for general adult medical examination without abnormal findings: Secondary | ICD-10-CM | POA: Diagnosis not present

## 2022-02-02 DIAGNOSIS — I252 Old myocardial infarction: Secondary | ICD-10-CM | POA: Diagnosis not present

## 2022-02-02 DIAGNOSIS — E782 Mixed hyperlipidemia: Secondary | ICD-10-CM | POA: Diagnosis not present

## 2022-02-02 DIAGNOSIS — I10 Essential (primary) hypertension: Secondary | ICD-10-CM | POA: Diagnosis not present

## 2022-02-02 DIAGNOSIS — Z125 Encounter for screening for malignant neoplasm of prostate: Secondary | ICD-10-CM | POA: Diagnosis not present

## 2022-02-18 ENCOUNTER — Other Ambulatory Visit: Payer: Self-pay | Admitting: Interventional Cardiology

## 2022-02-20 ENCOUNTER — Other Ambulatory Visit: Payer: Self-pay | Admitting: *Deleted

## 2022-02-20 MED ORDER — CLOPIDOGREL BISULFATE 75 MG PO TABS: 75.0000 mg | ORAL_TABLET | Freq: Every day | ORAL | 1 refills | Status: DC

## 2022-03-06 DIAGNOSIS — M79671 Pain in right foot: Secondary | ICD-10-CM | POA: Diagnosis not present

## 2022-03-17 DIAGNOSIS — S9781XA Crushing injury of right foot, initial encounter: Secondary | ICD-10-CM | POA: Diagnosis not present

## 2022-03-17 DIAGNOSIS — M79671 Pain in right foot: Secondary | ICD-10-CM | POA: Diagnosis not present

## 2022-03-30 DIAGNOSIS — M79604 Pain in right leg: Secondary | ICD-10-CM | POA: Diagnosis not present

## 2022-04-04 ENCOUNTER — Other Ambulatory Visit (HOSPITAL_COMMUNITY): Payer: Self-pay

## 2022-04-04 MED ORDER — WEGOVY 0.5 MG/0.5ML ~~LOC~~ SOAJ
SUBCUTANEOUS | 1 refills | Status: DC
  Filled 2022-04-04: qty 2, 28d supply, fill #0
  Filled 2022-05-04 – 2022-05-10 (×2): qty 2, 28d supply, fill #1

## 2022-04-10 ENCOUNTER — Other Ambulatory Visit (HOSPITAL_COMMUNITY): Payer: Self-pay

## 2022-04-11 ENCOUNTER — Other Ambulatory Visit (HOSPITAL_COMMUNITY): Payer: Self-pay

## 2022-05-10 ENCOUNTER — Other Ambulatory Visit (HOSPITAL_COMMUNITY): Payer: Self-pay

## 2022-05-10 MED ORDER — WEGOVY 1 MG/0.5ML ~~LOC~~ SOAJ
SUBCUTANEOUS | 1 refills | Status: DC
  Filled 2022-05-10: qty 2, 28d supply, fill #0
  Filled 2022-06-14: qty 2, 28d supply, fill #1

## 2022-05-16 ENCOUNTER — Other Ambulatory Visit (HOSPITAL_COMMUNITY): Payer: Self-pay

## 2022-05-19 ENCOUNTER — Other Ambulatory Visit: Payer: Self-pay

## 2022-05-19 ENCOUNTER — Other Ambulatory Visit: Payer: Self-pay | Admitting: Interventional Cardiology

## 2022-05-19 MED ORDER — LOSARTAN POTASSIUM 50 MG PO TABS: 50.0000 mg | ORAL_TABLET | Freq: Every day | ORAL | 0 refills | Status: DC

## 2022-05-31 ENCOUNTER — Other Ambulatory Visit (HOSPITAL_COMMUNITY): Payer: Self-pay

## 2022-06-03 ENCOUNTER — Other Ambulatory Visit (HOSPITAL_COMMUNITY): Payer: Self-pay

## 2022-06-14 ENCOUNTER — Other Ambulatory Visit (HOSPITAL_COMMUNITY): Payer: Self-pay

## 2022-06-16 ENCOUNTER — Other Ambulatory Visit: Payer: Self-pay | Admitting: Interventional Cardiology

## 2022-06-16 DIAGNOSIS — I5042 Chronic combined systolic (congestive) and diastolic (congestive) heart failure: Secondary | ICD-10-CM

## 2022-06-18 ENCOUNTER — Other Ambulatory Visit: Payer: Self-pay | Admitting: Interventional Cardiology

## 2022-06-19 ENCOUNTER — Other Ambulatory Visit: Payer: Self-pay | Admitting: Pharmacist

## 2022-07-04 DIAGNOSIS — E782 Mixed hyperlipidemia: Secondary | ICD-10-CM | POA: Diagnosis not present

## 2022-07-04 DIAGNOSIS — I708 Atherosclerosis of other arteries: Secondary | ICD-10-CM | POA: Diagnosis not present

## 2022-07-11 ENCOUNTER — Other Ambulatory Visit: Payer: Self-pay

## 2022-07-11 ENCOUNTER — Other Ambulatory Visit (HOSPITAL_BASED_OUTPATIENT_CLINIC_OR_DEPARTMENT_OTHER): Payer: Self-pay

## 2022-07-11 DIAGNOSIS — I708 Atherosclerosis of other arteries: Secondary | ICD-10-CM

## 2022-07-15 ENCOUNTER — Other Ambulatory Visit: Payer: Self-pay | Admitting: Interventional Cardiology

## 2022-07-17 ENCOUNTER — Other Ambulatory Visit: Payer: Self-pay

## 2022-07-17 MED ORDER — CARVEDILOL 6.25 MG PO TABS: 6.2500 mg | ORAL_TABLET | Freq: Two times a day (BID) | ORAL | 0 refills | Status: DC

## 2022-07-17 MED ORDER — LOSARTAN POTASSIUM 50 MG PO TABS: 50.0000 mg | ORAL_TABLET | Freq: Every day | ORAL | 0 refills | Status: DC

## 2022-07-17 NOTE — Addendum Note (Signed)
Addended by: Carter Kitten D on: 07/17/2022 08:57 AM   Modules accepted: Orders

## 2022-08-08 ENCOUNTER — Ambulatory Visit (HOSPITAL_COMMUNITY)
Admission: RE | Admit: 2022-08-08 | Discharge: 2022-08-08 | Disposition: A | Discharge status: Home / Self Care | Payer: BC Managed Care – PPO | Source: Ambulatory Visit

## 2022-08-08 DIAGNOSIS — I708 Atherosclerosis of other arteries: Secondary | ICD-10-CM | POA: Insufficient documentation

## 2022-08-17 ENCOUNTER — Other Ambulatory Visit: Payer: Self-pay | Admitting: Interventional Cardiology

## 2022-09-12 ENCOUNTER — Other Ambulatory Visit: Payer: Self-pay

## 2022-09-12 MED ORDER — CARVEDILOL 6.25 MG PO TABS: 6.2500 mg | ORAL_TABLET | Freq: Two times a day (BID) | ORAL | 0 refills | Status: DC

## 2022-09-13 ENCOUNTER — Other Ambulatory Visit: Payer: Self-pay | Admitting: Interventional Cardiology

## 2022-09-16 ENCOUNTER — Other Ambulatory Visit: Payer: Self-pay | Admitting: Interventional Cardiology

## 2022-09-16 DIAGNOSIS — E785 Hyperlipidemia, unspecified: Secondary | ICD-10-CM

## 2022-09-19 ENCOUNTER — Other Ambulatory Visit: Payer: Self-pay

## 2022-09-19 DIAGNOSIS — E785 Hyperlipidemia, unspecified: Secondary | ICD-10-CM

## 2022-09-19 MED ORDER — CLOPIDOGREL BISULFATE 75 MG PO TABS: 75.0000 mg | ORAL_TABLET | Freq: Every day | ORAL | 0 refills | Status: DC

## 2022-09-19 MED ORDER — ATORVASTATIN CALCIUM 40 MG PO TABS: 40.0000 mg | ORAL_TABLET | Freq: Every day | ORAL | 0 refills | Status: DC

## 2022-09-19 NOTE — Addendum Note (Signed)
Addended by: Margaret Pyle D on: 09/19/2022 02:02 PM   Modules accepted: Orders

## 2022-10-12 ENCOUNTER — Other Ambulatory Visit: Payer: Self-pay | Admitting: Interventional Cardiology

## 2022-10-17 NOTE — Progress Notes (Unsigned)
Office Visit    Patient Name: Devin Wright Date of Encounter: 10/19/2022  Primary Care Provider:  Merri Brunette, MD Primary Cardiologist:  Lesleigh Noe, MD Primary Electrophysiologist: None  Chief Complaint    Devin Wright is a 51 y.o. male with PMH of CAD s/p anterior MI in 2020 treated with DES to LAD, HLD, HTN, obesity who presents today for annual follow-up of coronary artery disease.  Past Medical History    Past Medical History:  Diagnosis Date   Acute systolic (congestive) heart failure (HCC)    Family history of heart disease    HTN (hypertension)    Hyperlipidemia    Palpitations    STEMI (ST elevation myocardial infarction) (HCC)    PCI/DES to p/mLAD, 60% lcx, 40% ostial LM, and 60% PDA, EF 45%   Past Surgical History:  Procedure Laterality Date   CORONARY/GRAFT ACUTE MI REVASCULARIZATION N/A 12/21/2018   Procedure: Coronary/Graft Acute MI Revascularization;  Surgeon: Lyn Records, MD;  Location: [facility];  Service: Cardiovascular;  Laterality: N/A;   LEFT HEART CATH AND CORONARY ANGIOGRAPHY N/A 12/21/2018   Procedure: LEFT HEART CATH AND CORONARY ANGIOGRAPHY;  Surgeon: Lyn Records, MD;  Location: [facility];  Service: Cardiovascular;  Laterality: N/A;    Allergies  No Known Allergies  History of Present Illness    Devin Wright  is a 51year old male with the above mention past medical history who presents today for annual follow-up of coronary artery disease.  He was initially seen in 2020 after suffering an anterior MI that was treated with overlapping DES to proximal mid LAD with 90% mid vessel stenosis and first diet, 50% ostial to proximal circumflex, with diffuse luminal irregularities in RCA.  2D echo was completed showing EF of 40-45% with moderately increased LVH and severe hypokinesis and mid inferior septal and anterior septal wall akinesis.  He was placed on GDMT and had improved LV function in 2022 that was 55-60%.  He  was seen in follow-up with complaint of shortness of breath with exertion and Myoview will be obtained if symptoms persist.  He was last seen by Dr. Katrinka Blazing 06/2021 for follow-up.  During visit patient was doing well with no cardiac complaints.    Devin Wright presents today for 1 year follow-up of coronary artery disease alone.  Since last being seen in the office patient reports he has been doing well with no new cardiac complaints.  He is tolerating his current medications without any adverse reactions or side effects.  His blood pressure today was well-controlled at 116/88 and heart rate was 65 bpm.  He is euvolemic on examination today.  He reports that he has had occasional episodes where he has missed his evening dose of statin and carvedilol.  He is currently in the process of finishing a home gym where he will dissipate in regular exercise.  He is no longer on Wegovy due to increased cramping in his legs.  He is motivated to currently lose weight and abstain from eating poorly.  During his visit we discussed further primary and secondary prevention for cardiovascular disease.  He had no further questions regarding his medications or risk factors..  Patient denies chest pain, palpitations, dyspnea, PND, orthopnea, nausea, vomiting, dizziness, syncope, edema, weight gain, or early satiety.   Home Medications    Current Outpatient Medications  Medication Sig Dispense Refill   atorvastatin (LIPITOR) 40 MG tablet Take 1 tablet (40  mg total) by mouth daily. 15 tablet 0   carvedilol (COREG) 6.25 MG tablet Take 1 tablet (6.25 mg total) by mouth 2 (two) times daily with a meal. 30 tablet 0   clopidogrel (PLAVIX) 75 MG tablet Take 1 tablet (75 mg total) by mouth daily. 15 tablet 0   losartan (COZAAR) 50 MG tablet Take 1 tablet (50 mg total) by mouth daily. Please call [phone] to schedule an appointment for future refills. Thank you. Final attempt. 15 tablet 0   nitroGLYCERIN (NITROSTAT) 0.4 MG SL tablet  Place 1 tablet (0.4 mg total) under the tongue every 5 (five) minutes as needed for chest pain. 75 tablet 1   PRALUENT 150 MG/ML SOAJ Inject 1 pen under the skin every 14 days. 6 mL 2   sildenafil (REVATIO) 20 MG tablet Take 40-100 mg by mouth daily as needed.     spironolactone (ALDACTONE) 25 MG tablet Take 1/2 tablet by mouth daily. 45 tablet 3   No current facility-administered medications for this visit.     Review of Systems  Please see the history of present illness.    (+) Trace lower extremity edema  All other systems reviewed and are otherwise negative except as noted above.  Physical Exam    Wt Readings from Last 3 Encounters:  10/19/22 286 lb 3.2 oz (129.8 kg)  06/23/21 281 lb 3.2 oz (127.6 kg)  02/16/21 277 lb 9.6 oz (125.9 kg)   VS: Vitals:   10/19/22 0837  BP: 116/88  Pulse: 65  SpO2: 97%  ,Body mass index is 36.75 kg/m.  Constitutional:      Appearance: Healthy appearance. Not in distress.  Neck:     Vascular: JVD normal.  Pulmonary:     Effort: Pulmonary effort is normal.     Breath sounds: No wheezing. No rales. Diminished in the bases Cardiovascular:     Normal rate. Regular rhythm. Normal S1. Normal S2.      Murmurs: There is no murmur.  Edema:    Peripheral edema absent.  Abdominal:     Palpations: Abdomen is soft non tender. There is no hepatomegaly.  Skin:    General: Skin is warm and dry.  Neurological:     General: No focal deficit present.     Mental Status: Alert and oriented to person, place and time.     Cranial Nerves: Cranial nerves are intact.  EKG/LABS/Other Studies Reviewed    ECG personally reviewed by me today -sinus rhythm with right axis deviation and rate of 65 bpm with no acute changes consistent with previous EKG.  Lab Results  Component Value Date   WBC 8.4 01/06/2019   HGB 17.7 01/06/2019   HCT 51.7 (H) 01/06/2019   MCV 86 01/06/2019   PLT 317 01/06/2019   Lab Results  Component Value Date   CREATININE 1.05  06/24/2019   BUN 14 06/24/2019   NA 137 06/24/2019   K 4.3 06/24/2019   CL 103 06/24/2019   CO2 26 06/24/2019   Lab Results  Component Value Date   ALT 45 (H) 06/24/2019   AST 25 06/24/2019   ALKPHOS 93 06/24/2019   BILITOT 1.2 06/24/2019   Lab Results  Component Value Date   CHOL 78 (L) 09/15/2019   HDL 38 (L) 09/15/2019   LDLCALC 21 09/15/2019   LDLDIRECT 72 07/25/2019   TRIG 100 09/15/2019   CHOLHDL 2.1 09/15/2019    Lab Results  Component Value Date   HGBA1C 4.8 12/22/2018  Assessment & Plan    1.  Coronary artery disease: -s/p anterior MI in 2020 with overlapping DES to proximal LAD -Today patient reports that he has not had any chest pain or anginal equivalent since previous visit -Continue GDMT with Coreg 6.25 mg twice daily, Lipitor 40 mg, Praluent 150 mg q. 14 days and Aldactone 12.5 mg daily  2.  Hyperlipidemia: -Patient is due for lipids and LFTs today -Continue atorvastatin and Praluent as noted above  3.  Essential hypertension: -Patient's blood pressure today was well-controlled at 116/88 -Continue carvedilol, losartan and Aldactone  4.HFimEF: -Most recent 2D echo completed 2022 with EF normalized at 55 to 60% -Today patient is euvolemic on exam no indiscretions with excess salt -We discussed possibility of adding SGLT2 to his current medication regimen and he politely declined today and will consider in the future. -Continue GDMT with carvedilol, losartan, Aldactone   Disposition: Follow-up with Lyn Records III, MD or APP in 12 months    Medication Adjustments/Labs and Tests Ordered: Current medicines are reviewed at length with the patient today.  Concerns regarding medicines are outlined above.   Signed, Napoleon Form, Leodis Rains, NP 10/19/2022, 11:08 AM [facility]  Note:  This document was prepared using Dragon voice recognition software and may include unintentional dictation errors.

## 2022-10-19 ENCOUNTER — Encounter: Payer: Self-pay | Admitting: Nurse Practitioner

## 2022-10-19 ENCOUNTER — Ambulatory Visit: Payer: BC Managed Care – PPO | Attending: Nurse Practitioner | Admitting: Nurse Practitioner

## 2022-10-19 VITALS — BP 116/88 | HR 65 | Ht 74.0 in | Wt 286.2 lb

## 2022-10-19 DIAGNOSIS — I2510 Atherosclerotic heart disease of native coronary artery without angina pectoris: Secondary | ICD-10-CM | POA: Diagnosis not present

## 2022-10-19 DIAGNOSIS — E785 Hyperlipidemia, unspecified: Secondary | ICD-10-CM | POA: Diagnosis not present

## 2022-10-19 DIAGNOSIS — I10 Essential (primary) hypertension: Secondary | ICD-10-CM | POA: Diagnosis not present

## 2022-10-19 DIAGNOSIS — I5022 Chronic systolic (congestive) heart failure: Secondary | ICD-10-CM

## 2022-10-19 LAB — LIPID PANEL
Chol/HDL Ratio: 3.2 ratio (ref 0.0–5.0)
Cholesterol, Total: 114 mg/dL (ref 100–199)
HDL: 36 mg/dL — ABNORMAL LOW (ref >39)
LDL Chol Calc (NIH): 54 mg/dL (ref 0–99)
Triglycerides: 136 mg/dL (ref 0–149)
VLDL Cholesterol Cal: 24 mg/dL (ref 5–40)

## 2022-10-19 LAB — COMPREHENSIVE METABOLIC PANEL
ALT: 39 IU/L (ref 0–44)
AST: 23 IU/L (ref 0–40)
Albumin/Globulin Ratio: 2.9 — ABNORMAL HIGH (ref 1.2–2.2)
Albumin: 4.4 g/dL (ref 4.1–5.1)
Alkaline Phosphatase: 97 IU/L (ref 44–121)
BUN/Creatinine Ratio: 14 (ref 9–20)
BUN: 16 mg/dL (ref 6–24)
Bilirubin Total: 0.8 mg/dL (ref 0.0–1.2)
CO2: 23 mmol/L (ref 20–29)
Calcium: 9 mg/dL (ref 8.7–10.2)
Chloride: 103 mmol/L (ref 96–106)
Creatinine, Ser: 1.12 mg/dL (ref 0.76–1.27)
Globulin, Total: 1.5 g/dL (ref 1.5–4.5)
Glucose: 91 mg/dL (ref 70–99)
Potassium: 4.7 mmol/L (ref 3.5–5.2)
Sodium: 141 mmol/L (ref 134–144)
Total Protein: 5.9 g/dL — ABNORMAL LOW (ref 6.0–8.5)
eGFR: 81 mL/min/{1.73_m2} (ref >59)

## 2022-10-19 NOTE — Patient Instructions (Signed)
Medication Instructions:   Your physician recommends that you continue on your current medications as directed. Please refer to the Current Medication list given to you today.   *If you need a refill on your cardiac medications before your next appointment, please call your pharmacy*   Lab Work:  CMET AND LIPIDS TODAY    If you have labs (blood work) drawn today and your tests are completely normal, you will receive your results only by: MyChart Message (if you have MyChart) OR A paper copy in the mail If you have any lab test that is abnormal or we need to change your treatment, we will call you to review the results.   Testing/Procedures: NONE ORDERED  TODAY    Follow-Up: At [facility], you and your health needs are our priority.  As part of our continuing mission to provide you with exceptional heart care, we have created designated Provider Care Teams.  These Care Teams include your primary Cardiologist (physician) and Advanced Practice Providers (APPs -  Physician Assistants and Nurse Practitioners) who all work together to provide you with the care you need, when you need it.  We recommend signing up for the patient portal called "MyChart".  Sign up information is provided on this After Visit Summary.  MyChart is used to connect with patients for Virtual Visits (Telemedicine).  Patients are able to view lab/test results, encounter notes, upcoming appointments, etc.  Non-urgent messages can be sent to your provider as well.   To learn more about what you can do with MyChart, go to [email or web address].    Your next appointment:   1 year(s)  The format for your next appointment:   In Person  Provider:   Lesleigh NoeHenry W Smith III, MD    Other Instructions   Heart-Healthy Eating Plan Eating a healthy diet is important for the health of your heart. A heart-healthy eating plan includes: Eating less unhealthy fats. Eating more healthy fats. Eating less salt in your  food. Salt is also called sodium. Making other changes in your diet. Talk with your doctor or a diet specialist (dietitian) to create an eating plan that is right for you. What is my plan? Your doctor may recommend an eating plan that includes: Total fat: ______% or less of total calories a day. Saturated fat: ______% or less of total calories a day. Cholesterol: less than _________mg a day. Sodium: less than _________mg a day. What are tips for following this plan? Cooking Avoid frying your food. Try to bake, boil, grill, or broil it instead. You can also reduce fat by: Removing the skin from poultry. Removing all visible fats from meats. Steaming vegetables in water or broth. Meal planning  At meals, divide your plate into four equal parts: Fill one-half of your plate with vegetables and green salads. Fill one-fourth of your plate with whole grains. Fill one-fourth of your plate with lean protein foods. Eat 2-4 cups of vegetables per day. One cup of vegetables is: 1 cup (91 g) broccoli or cauliflower florets. 2 medium carrots. 1 large bell pepper. 1 large sweet potato. 1 large tomato. 1 medium white potato. 2 cups (150 g) raw leafy greens. Eat 1-2 cups of fruit per day. One cup of fruit is: 1 small apple 1 large banana 1 cup (237 g) mixed fruit, 1 large orange,  cup (82 g) dried fruit, 1 cup (240 mL) 100% fruit juice. Eat more foods that have soluble fiber. These are apples, broccoli, carrots, beans, peas, and  barley. Try to get 20-30 g of fiber per day. Eat 4-5 servings of nuts, legumes, and seeds per week: 1 serving of dried beans or legumes equals  cup (90 g) cooked. 1 serving of nuts is  oz (12 almonds, 24 pistachios, or 7 walnut halves). 1 serving of seeds equals  oz (8 g). General information Eat more home-cooked food. Eat less restaurant, buffet, and fast food. Limit or avoid alcohol. Limit foods that are high in starch and sugar. Avoid fried foods. Lose  weight if you are overweight. Keep track of how much salt (sodium) you eat. This is important if you have high blood pressure. Ask your doctor to tell you more about this. Try to add vegetarian meals each week. Fats Choose healthy fats. These include olive oil and canola oil, flaxseeds, walnuts, almonds, and seeds. Eat more omega-3 fats. These include salmon, mackerel, sardines, tuna, flaxseed oil, and ground flaxseeds. Try to eat fish at least 2 times each week. Check food labels. Avoid foods with trans fats or high amounts of saturated fat. Limit saturated fats. These are often found in animal products, such as meats, butter, and cream. These are also found in plant foods, such as palm oil, palm kernel oil, and coconut oil. Avoid foods with partially hydrogenated oils in them. These have trans fats. Examples are stick margarine, some tub margarines, cookies, crackers, and other baked goods. What foods should I eat? Fruits All fresh, canned (in natural juice), or frozen fruits. Vegetables Fresh or frozen vegetables (raw, steamed, roasted, or grilled). Green salads. Grains Most grains. Choose whole wheat and whole grains most of the time. Rice and pasta, including brown rice and pastas made with whole wheat. Meats and other proteins Lean, well-trimmed beef, veal, pork, and lamb. Chicken and [place] without skin. All fish and shellfish. Wild duck, rabbit, pheasant, and venison. Egg whites or low-cholesterol egg substitutes. Dried beans, peas, lentils, and tofu. Seeds and most nuts. Dairy Low-fat or nonfat cheeses, including ricotta and mozzarella. Skim or 1% milk that is liquid, powdered, or evaporated. Buttermilk that is made with low-fat milk. Nonfat or low-fat yogurt. Fats and oils Non-hydrogenated (trans-free) margarines. Vegetable oils, including soybean, sesame, sunflower, olive, peanut, safflower, corn, canola, and cottonseed. Salad dressings or mayonnaise made with a vegetable  oil. Beverages Mineral water. Coffee and tea. Diet carbonated beverages. Sweets and desserts Sherbet, gelatin, and fruit ice. Small amounts of dark chocolate. Limit all sweets and desserts. Seasonings and condiments All seasonings and condiments. The items listed above may not be a complete list of foods and drinks you can eat. Contact a dietitian for more options. What foods should I avoid? Fruits Canned fruit in heavy syrup. Fruit in cream or butter sauce. Fried fruit. Limit coconut. Vegetables Vegetables cooked in cheese, cream, or butter sauce. Fried vegetables. Grains Breads that are made with saturated or trans fats, oils, or whole milk. Croissants. Sweet rolls. Donuts. High-fat crackers, such as cheese crackers. Meats and other proteins Fatty meats, such as hot dogs, ribs, sausage, bacon, rib-eye roast or steak. High-fat deli meats, such as salami and bologna. Caviar. Domestic duck and goose. Organ meats, such as liver. Dairy Cream, sour cream, cream cheese, and creamed cottage cheese. Whole-milk cheeses. Whole or 2% milk that is liquid, evaporated, or condensed. Whole buttermilk. Cream sauce or high-fat cheese sauce. Yogurt that is made from whole milk. Fats and oils Meat fat, or shortening. Cocoa butter, hydrogenated oils, palm oil, coconut oil, palm kernel oil. Solid fats and shortenings,  including bacon fat, salt pork, lard, and butter. Nondairy cream substitutes. Salad dressings with cheese or sour cream. Beverages Regular sodas and juice drinks with added sugar. Sweets and desserts Frosting. Pudding. Cookies. Cakes. Pies. Milk chocolate or white chocolate. Buttered syrups. Full-fat ice cream or ice cream drinks. The items listed above may not be a complete list of foods and drinks to avoid. Contact a dietitian for more information. Summary Heart-healthy meal planning includes eating less unhealthy fats, eating more healthy fats, and making other changes in your diet. Eat a  balanced diet. This includes fruits and vegetables, low-fat or nonfat dairy, lean protein, nuts and legumes, whole grains, and heart-healthy oils and fats. This information is not intended to replace advice given to you by your health care provider. Make sure you discuss any questions you have with your health care provider. Document Revised: 11/14/2021 Document Reviewed: 11/14/2021 Elsevier Patient Education  2023 Elsevier Inc.  Low-Sodium Eating Plan Sodium, which is an element that makes up salt, helps you maintain a healthy balance of fluids in your body. Too much sodium can increase your blood pressure and cause fluid and waste to be held in your body. Your health care provider or dietitian may recommend following this plan if you have high blood pressure (hypertension), kidney disease, liver disease, or heart failure. Eating less sodium can help lower your blood pressure, reduce swelling, and protect your heart, liver, and kidneys. What are tips for following this plan? Reading food labels The Nutrition Facts label lists the amount of sodium in one serving of the food. If you eat more than one serving, you must multiply the listed amount of sodium by the number of servings. Choose foods with less than 140 mg of sodium per serving. Avoid foods with 300 mg of sodium or more per serving. Shopping  Look for lower-sodium products, often labeled as "low-sodium" or "no salt added." Always check the sodium content, even if foods are labeled as "unsalted" or "no salt added." Buy fresh foods. Avoid canned foods and pre-made or frozen meals. Avoid canned, cured, or processed meats. Buy breads that have less than 80 mg of sodium per slice. Cooking  Eat more home-cooked food and less restaurant, buffet, and fast food. Avoid adding salt when cooking. Use salt-free seasonings or herbs instead of table salt or sea salt. Check with your health care provider or pharmacist before using salt  substitutes. Cook with plant-based oils, such as canola, sunflower, or olive oil. Meal planning When eating at a restaurant, ask that your food be prepared with less salt or no salt, if possible. Avoid dishes labeled as brined, pickled, cured, smoked, or made with soy sauce, miso, or teriyaki sauce. Avoid foods that contain MSG (monosodium glutamate). MSG is sometimes added to [place] food, bouillon, and some canned foods. Make meals that can be grilled, baked, poached, roasted, or steamed. These are generally made with less sodium. General information Most people on this plan should limit their sodium intake to 1,500-2,000 mg (milligrams) of sodium each day. What foods should I eat? Fruits Fresh, frozen, or canned fruit. Fruit juice. Vegetables Fresh or frozen vegetables. "No salt added" canned vegetables. "No salt added" tomato sauce and paste. Low-sodium or reduced-sodium tomato and vegetable juice. Grains Low-sodium cereals, including oats, puffed wheat and rice, and shredded wheat. Low-sodium crackers. Unsalted rice. Unsalted pasta. Low-sodium bread. Whole-grain breads and whole-grain pasta. Meats and other proteins Fresh or frozen (no salt added) meat, poultry, seafood, and fish. Low-sodium canned tuna  and salmon. Unsalted nuts. Dried peas, beans, and lentils without added salt. Unsalted canned beans. Eggs. Unsalted nut butters. Dairy Milk. Soy milk. Cheese that is naturally low in sodium, such as ricotta cheese, fresh mozzarella, or Swiss cheese. Low-sodium or reduced-sodium cheese. Cream cheese. Yogurt. Seasonings and condiments Fresh and dried herbs and spices. Salt-free seasonings. Low-sodium mustard and ketchup. Sodium-free salad dressing. Sodium-free light mayonnaise. Fresh or refrigerated horseradish. Lemon juice. Vinegar. Other foods Homemade, reduced-sodium, or low-sodium soups. Unsalted popcorn and pretzels. Low-salt or salt-free chips. The items listed above may not be a  complete list of foods and beverages you can eat. Contact a dietitian for more information. What foods should I avoid? Vegetables Sauerkraut, pickled vegetables, and relishes. Olives. [place] fries. Onion rings. Regular canned vegetables (not low-sodium or reduced-sodium). Regular canned tomato sauce and paste (not low-sodium or reduced-sodium). Regular tomato and vegetable juice (not low-sodium or reduced-sodium). Frozen vegetables in sauces. Grains Instant hot cereals. Bread stuffing, pancake, and biscuit mixes. Croutons. Seasoned rice or pasta mixes. Noodle soup cups. Boxed or frozen macaroni and cheese. Regular salted crackers. Self-rising flour. Meats and other proteins Meat or fish that is salted, canned, smoked, spiced, or pickled. Precooked or cured meat, such as sausages or meat loaves. Tomasa Blase. Ham. Pepperoni. Hot dogs. Corned beef. Chipped beef. Salt pork. Jerky. Pickled herring. Anchovies and sardines. Regular canned tuna. Salted nuts. Dairy Processed cheese and cheese spreads. Hard cheeses. Cheese curds. Blue cheese. Feta cheese. String cheese. Regular cottage cheese. Buttermilk. Canned milk. Fats and oils Salted butter. Regular margarine. Ghee. Bacon fat. Seasonings and condiments Onion salt, garlic salt, seasoned salt, table salt, and sea salt. Canned and packaged gravies. Worcestershire sauce. Tartar sauce. Barbecue sauce. Teriyaki sauce. Soy sauce, including reduced-sodium. Steak sauce. Fish sauce. Oyster sauce. Cocktail sauce. Horseradish that you find on the shelf. Regular ketchup and mustard. Meat flavorings and tenderizers. Bouillon cubes. Hot sauce. Pre-made or packaged marinades. Pre-made or packaged taco seasonings. Relishes. Regular salad dressings. Salsa. Other foods Salted popcorn and pretzels. Corn chips and puffs. Potato and tortilla chips. Canned or dried soups. Pizza. Frozen entrees and pot pies. The items listed above may not be a complete list of foods and beverages you  should avoid. Contact a dietitian for more information. Summary Eating less sodium can help lower your blood pressure, reduce swelling, and protect your heart, liver, and kidneys. Most people on this plan should limit their sodium intake to 1,500-2,000 mg (milligrams) of sodium each day. Canned, boxed, and frozen foods are high in sodium. Restaurant foods, fast foods, and pizza are also very high in sodium. You also get sodium by adding salt to food. Try to cook at home, eat more fresh fruits and vegetables, and eat less fast food and canned, processed, or prepared foods. This information is not intended to replace advice given to you by your health care provider. Make sure you discuss any questions you have with your health care provider. Document Revised: 11/14/2019 Document Reviewed: 09/10/2019 Elsevier Patient Education  2023 Elsevier Inc.  Important Information About Sugar

## 2022-10-24 ENCOUNTER — Other Ambulatory Visit: Payer: Self-pay

## 2022-10-24 DIAGNOSIS — E785 Hyperlipidemia, unspecified: Secondary | ICD-10-CM

## 2022-10-24 MED ORDER — ATORVASTATIN CALCIUM 40 MG PO TABS: 40.0000 mg | ORAL_TABLET | Freq: Every day | ORAL | 3 refills | Status: DC

## 2022-12-20 ENCOUNTER — Other Ambulatory Visit: Payer: Self-pay

## 2022-12-20 DIAGNOSIS — I5042 Chronic combined systolic (congestive) and diastolic (congestive) heart failure: Secondary | ICD-10-CM

## 2022-12-20 MED ORDER — NITROGLYCERIN 0.4 MG SL SUBL: 0.4000 mg | SUBLINGUAL_TABLET | SUBLINGUAL | 2 refills | Status: DC | PRN

## 2022-12-20 MED ORDER — LOSARTAN POTASSIUM 50 MG PO TABS: 50.0000 mg | ORAL_TABLET | Freq: Every day | ORAL | 3 refills | Status: DC

## 2022-12-20 MED ORDER — CARVEDILOL 6.25 MG PO TABS: 6.2500 mg | ORAL_TABLET | Freq: Two times a day (BID) | ORAL | 3 refills | Status: DC

## 2022-12-20 MED ORDER — CLOPIDOGREL BISULFATE 75 MG PO TABS: 75.0000 mg | ORAL_TABLET | Freq: Every day | ORAL | 3 refills | Status: DC

## 2022-12-20 MED ORDER — SPIRONOLACTONE 25 MG PO TABS: 12.5000 mg | ORAL_TABLET | Freq: Every day | ORAL | 3 refills | Status: DC

## 2022-12-20 NOTE — Telephone Encounter (Signed)
Pt's medications were sent to pt's pharmacy as requested. Confirmation received.  

## 2022-12-25 ENCOUNTER — Telehealth: Payer: Self-pay | Admitting: Nurse Practitioner

## 2022-12-25 NOTE — Telephone Encounter (Signed)
Spoke with pharmacy tech to clarify Nitrostat 0.'4mg'$  PRN order. Maximum dose in a 24 hour period is 3 tablets.

## 2022-12-25 NOTE — Telephone Encounter (Signed)
Pt c/o medication issue:  1. Name of Medication:   nitroGLYCERIN (NITROSTAT) 0.4 MG SL tablet   2. How are you currently taking this medication (dosage and times per day)?   3. Are you having a reaction (difficulty breathing--STAT)?   4. What is your medication issue?   Caller stated they will need to get the maximum daily dose for this medication.

## 2023-02-02 DIAGNOSIS — Z0000 Encounter for general adult medical examination without abnormal findings: Secondary | ICD-10-CM | POA: Diagnosis not present

## 2023-02-02 DIAGNOSIS — Z125 Encounter for screening for malignant neoplasm of prostate: Secondary | ICD-10-CM | POA: Diagnosis not present

## 2023-02-08 DIAGNOSIS — I252 Old myocardial infarction: Secondary | ICD-10-CM | POA: Diagnosis not present

## 2023-02-08 DIAGNOSIS — I10 Essential (primary) hypertension: Secondary | ICD-10-CM | POA: Diagnosis not present

## 2023-02-08 DIAGNOSIS — I2510 Atherosclerotic heart disease of native coronary artery without angina pectoris: Secondary | ICD-10-CM | POA: Diagnosis not present

## 2023-02-08 DIAGNOSIS — Z1211 Encounter for screening for malignant neoplasm of colon: Secondary | ICD-10-CM | POA: Diagnosis not present

## 2023-02-08 DIAGNOSIS — I708 Atherosclerosis of other arteries: Secondary | ICD-10-CM | POA: Diagnosis not present

## 2023-02-08 DIAGNOSIS — Z0000 Encounter for general adult medical examination without abnormal findings: Secondary | ICD-10-CM | POA: Diagnosis not present

## 2023-03-15 DIAGNOSIS — Z1212 Encounter for screening for malignant neoplasm of rectum: Secondary | ICD-10-CM | POA: Diagnosis not present

## 2023-03-15 DIAGNOSIS — Z1211 Encounter for screening for malignant neoplasm of colon: Secondary | ICD-10-CM | POA: Diagnosis not present

## 2023-09-14 ENCOUNTER — Inpatient Hospital Stay (HOSPITAL_COMMUNITY)
Admission: EM | Admit: 2023-09-14 | Discharge: 2023-09-15 | DRG: 322 | Disposition: A | Discharge status: Home / Self Care | Payer: BC Managed Care – PPO | Attending: Cardiology | Admitting: Cardiology

## 2023-09-14 ENCOUNTER — Other Ambulatory Visit: Payer: Self-pay

## 2023-09-14 ENCOUNTER — Encounter (HOSPITAL_COMMUNITY): Payer: Self-pay | Admitting: Cardiology

## 2023-09-14 ENCOUNTER — Encounter (HOSPITAL_COMMUNITY)
Admission: EM | Disposition: A | Discharge status: Home / Self Care | Payer: Self-pay | Source: Home / Self Care | Attending: Cardiology

## 2023-09-14 DIAGNOSIS — I2111 ST elevation (STEMI) myocardial infarction involving right coronary artery: Secondary | ICD-10-CM

## 2023-09-14 DIAGNOSIS — E669 Obesity, unspecified: Secondary | ICD-10-CM | POA: Diagnosis present

## 2023-09-14 DIAGNOSIS — I252 Old myocardial infarction: Secondary | ICD-10-CM

## 2023-09-14 DIAGNOSIS — E785 Hyperlipidemia, unspecified: Secondary | ICD-10-CM | POA: Diagnosis present

## 2023-09-14 DIAGNOSIS — Z955 Presence of coronary angioplasty implant and graft: Secondary | ICD-10-CM

## 2023-09-14 DIAGNOSIS — R0789 Other chest pain: Secondary | ICD-10-CM | POA: Diagnosis not present

## 2023-09-14 DIAGNOSIS — I10 Essential (primary) hypertension: Secondary | ICD-10-CM | POA: Diagnosis present

## 2023-09-14 DIAGNOSIS — I213 ST elevation (STEMI) myocardial infarction of unspecified site: Secondary | ICD-10-CM | POA: Diagnosis not present

## 2023-09-14 DIAGNOSIS — Z79899 Other long term (current) drug therapy: Secondary | ICD-10-CM

## 2023-09-14 DIAGNOSIS — Z825 Family history of asthma and other chronic lower respiratory diseases: Secondary | ICD-10-CM | POA: Diagnosis not present

## 2023-09-14 DIAGNOSIS — Z6836 Body mass index (BMI) 36.0-36.9, adult: Secondary | ICD-10-CM

## 2023-09-14 DIAGNOSIS — E7800 Pure hypercholesterolemia, unspecified: Secondary | ICD-10-CM | POA: Diagnosis present

## 2023-09-14 DIAGNOSIS — Z808 Family history of malignant neoplasm of other organs or systems: Secondary | ICD-10-CM

## 2023-09-14 DIAGNOSIS — I2119 ST elevation (STEMI) myocardial infarction involving other coronary artery of inferior wall: Secondary | ICD-10-CM | POA: Diagnosis not present

## 2023-09-14 DIAGNOSIS — Z833 Family history of diabetes mellitus: Secondary | ICD-10-CM | POA: Diagnosis not present

## 2023-09-14 DIAGNOSIS — R0689 Other abnormalities of breathing: Secondary | ICD-10-CM | POA: Diagnosis not present

## 2023-09-14 DIAGNOSIS — Z8249 Family history of ischemic heart disease and other diseases of the circulatory system: Secondary | ICD-10-CM | POA: Diagnosis not present

## 2023-09-14 DIAGNOSIS — I499 Cardiac arrhythmia, unspecified: Secondary | ICD-10-CM | POA: Diagnosis not present

## 2023-09-14 DIAGNOSIS — I2510 Atherosclerotic heart disease of native coronary artery without angina pectoris: Secondary | ICD-10-CM | POA: Diagnosis present

## 2023-09-14 DIAGNOSIS — R079 Chest pain, unspecified: Secondary | ICD-10-CM | POA: Diagnosis not present

## 2023-09-14 DIAGNOSIS — Z7902 Long term (current) use of antithrombotics/antiplatelets: Secondary | ICD-10-CM

## 2023-09-14 HISTORY — DX: Atherosclerotic heart disease of native coronary artery without angina pectoris: I25.10

## 2023-09-14 HISTORY — DX: ST elevation (STEMI) myocardial infarction involving other coronary artery of inferior wall: I21.19

## 2023-09-14 HISTORY — PX: CORONARY/GRAFT ACUTE MI REVASCULARIZATION: CATH118305

## 2023-09-14 LAB — CBC WITH DIFFERENTIAL/PLATELET
Abs Immature Granulocytes: 0.05 10*3/uL (ref 0.00–0.07)
Basophils Absolute: 0.1 10*3/uL (ref 0.0–0.1)
Basophils Relative: 1 %
Eosinophils Absolute: 0.1 10*3/uL (ref 0.0–0.5)
Eosinophils Relative: 1 %
HCT: 44.2 % (ref 39.0–52.0)
Hemoglobin: 15.7 g/dL (ref 13.0–17.0)
Immature Granulocytes: 1 %
Lymphocytes Relative: 12 %
Lymphs Abs: 1.2 10*3/uL (ref 0.7–4.0)
MCH: 29.5 pg (ref 26.0–34.0)
MCHC: 35.5 g/dL (ref 30.0–36.0)
MCV: 83.1 fL (ref 80.0–100.0)
Monocytes Absolute: 0.8 10*3/uL (ref 0.1–1.0)
Monocytes Relative: 8 %
Neutro Abs: 7.8 10*3/uL — ABNORMAL HIGH (ref 1.7–7.7)
Neutrophils Relative %: 77 %
Platelets: 221 10*3/uL (ref 150–400)
RBC: 5.32 MIL/uL (ref 4.22–5.81)
RDW: 12 % (ref 11.5–15.5)
WBC: 10 10*3/uL (ref 4.0–10.5)
nRBC: 0 % (ref 0.0–0.2)

## 2023-09-14 LAB — TROPONIN I (HIGH SENSITIVITY)
Troponin I (High Sensitivity): 168 ng/L (ref <18)
Troponin I (High Sensitivity): >24000 ng/L

## 2023-09-14 LAB — POCT I-STAT, CHEM 8
BUN: 17 mg/dL (ref 6–20)
Calcium, Ion: 1.13 mmol/L — ABNORMAL LOW (ref 1.15–1.40)
Chloride: 105 mmol/L (ref 98–111)
Creatinine, Ser: 1.3 mg/dL — ABNORMAL HIGH (ref 0.61–1.24)
Glucose, Bld: 109 mg/dL — ABNORMAL HIGH (ref 70–99)
HCT: 44 % (ref 39.0–52.0)
Hemoglobin: 15 g/dL (ref 13.0–17.0)
Potassium: 4.1 mmol/L (ref 3.5–5.1)
Sodium: 139 mmol/L (ref 135–145)
TCO2: 22 mmol/L (ref 22–32)

## 2023-09-14 LAB — POCT ACTIVATED CLOTTING TIME: Activated Clotting Time: 296 s

## 2023-09-14 LAB — COMPREHENSIVE METABOLIC PANEL WITH GFR
ALT: 54 U/L — ABNORMAL HIGH (ref 0–44)
AST: 32 U/L (ref 15–41)
Albumin: 3.7 g/dL (ref 3.5–5.0)
Alkaline Phosphatase: 91 U/L (ref 38–126)
Anion gap: 9 (ref 5–15)
BUN: 14 mg/dL (ref 6–20)
CO2: 22 mmol/L (ref 22–32)
Calcium: 8.4 mg/dL — ABNORMAL LOW (ref 8.9–10.3)
Chloride: 106 mmol/L (ref 98–111)
Creatinine, Ser: 1.32 mg/dL — ABNORMAL HIGH (ref 0.61–1.24)
GFR, Estimated: >60 mL/min
Glucose, Bld: 103 mg/dL — ABNORMAL HIGH (ref 70–99)
Potassium: 4.1 mmol/L (ref 3.5–5.1)
Sodium: 137 mmol/L (ref 135–145)
Total Bilirubin: 0.6 mg/dL
Total Protein: 6 g/dL — ABNORMAL LOW (ref 6.5–8.1)

## 2023-09-14 LAB — LIPID PANEL
Cholesterol: 82 mg/dL (ref 0–200)
HDL: 27 mg/dL — ABNORMAL LOW
LDL Cholesterol: 8 mg/dL (ref 0–99)
Total CHOL/HDL Ratio: 3 ratio
Triglycerides: 235 mg/dL — ABNORMAL HIGH
VLDL: 47 mg/dL — ABNORMAL HIGH (ref 0–40)

## 2023-09-14 LAB — PROTIME-INR
INR: 1.4 — ABNORMAL HIGH (ref 0.8–1.2)
Prothrombin Time: 17.1 s — ABNORMAL HIGH (ref 11.4–15.2)

## 2023-09-14 LAB — APTT: aPTT: >200 s (ref 24–36)

## 2023-09-14 LAB — HEMOGLOBIN A1C
Hgb A1c MFr Bld: 5 % (ref 4.8–5.6)
Mean Plasma Glucose: 96.8 mg/dL

## 2023-09-14 LAB — CG4 I-STAT (LACTIC ACID): Lactic Acid, Venous: 1.7 mmol/L (ref 0.5–1.9)

## 2023-09-14 LAB — GLUCOSE, CAPILLARY: Glucose-Capillary: 119 mg/dL — ABNORMAL HIGH (ref 70–99)

## 2023-09-14 SURGERY — CORONARY/GRAFT ACUTE MI REVASCULARIZATION
Anesthesia: LOCAL

## 2023-09-14 MED ORDER — LOSARTAN POTASSIUM 50 MG PO TABS
50.0000 mg | ORAL_TABLET | Freq: Every day | ORAL | Status: DC
  Administered 2023-09-14 – 2023-09-15 (×2): 50 mg via ORAL
  Filled 2023-09-14 (×2): qty 1

## 2023-09-14 MED ORDER — ATORVASTATIN CALCIUM 40 MG PO TABS
40.0000 mg | ORAL_TABLET | Freq: Every day | ORAL | Status: DC
  Administered 2023-09-14 – 2023-09-15 (×2): 40 mg via ORAL
  Filled 2023-09-14 (×2): qty 1

## 2023-09-14 MED ORDER — FENTANYL CITRATE (PF) 100 MCG/2ML IJ SOLN
INTRAMUSCULAR | Status: DC | PRN
  Administered 2023-09-14: 25 ug via INTRAVENOUS

## 2023-09-14 MED ORDER — SODIUM CHLORIDE 0.9% FLUSH: 3.0000 mL | INTRAVENOUS | Status: DC | PRN

## 2023-09-14 MED ORDER — IOHEXOL 350 MG/ML SOLN
INTRAVENOUS | Status: DC | PRN
  Administered 2023-09-14: 115 mL

## 2023-09-14 MED ORDER — ONDANSETRON HCL 4 MG/2ML IJ SOLN: 4.0000 mg | Freq: Four times a day (QID) | INTRAMUSCULAR | Status: DC | PRN

## 2023-09-14 MED ORDER — HEPARIN SODIUM (PORCINE) 1000 UNIT/ML IJ SOLN
INTRAMUSCULAR | Status: AC
  Filled 2023-09-14: qty 10

## 2023-09-14 MED ORDER — SODIUM CHLORIDE 0.9 % WEIGHT BASED INFUSION
1.0000 mL/kg/h | INTRAVENOUS | Status: AC
  Administered 2023-09-14: 1 mL/kg/h via INTRAVENOUS

## 2023-09-14 MED ORDER — NITROGLYCERIN 1 MG/10 ML FOR IR/CATH LAB
INTRA_ARTERIAL | Status: DC | PRN
  Administered 2023-09-14 (×2): 200 ug via INTRACORONARY

## 2023-09-14 MED ORDER — CHLORHEXIDINE GLUCONATE CLOTH 2 % EX PADS
6.0000 | MEDICATED_PAD | Freq: Every day | CUTANEOUS | Status: DC
  Administered 2023-09-14 – 2023-09-15 (×2): 6 via TOPICAL

## 2023-09-14 MED ORDER — ORAL CARE MOUTH RINSE: 15.0000 mL | OROMUCOSAL | Status: DC | PRN

## 2023-09-14 MED ORDER — SODIUM CHLORIDE 0.9 % IV SOLN: 250.0000 mL | INTRAVENOUS | Status: DC | PRN

## 2023-09-14 MED ORDER — TICAGRELOR 90 MG PO TABS
ORAL_TABLET | ORAL | Status: DC | PRN
  Administered 2023-09-14: 180 mg via ORAL

## 2023-09-14 MED ORDER — FENTANYL CITRATE (PF) 100 MCG/2ML IJ SOLN
INTRAMUSCULAR | Status: AC
  Filled 2023-09-14: qty 2

## 2023-09-14 MED ORDER — VERAPAMIL HCL 2.5 MG/ML IV SOLN
INTRAVENOUS | Status: AC
  Filled 2023-09-14: qty 2

## 2023-09-14 MED ORDER — HEPARIN (PORCINE) IN NACL 1000-0.9 UT/500ML-% IV SOLN
INTRAVENOUS | Status: DC | PRN
  Administered 2023-09-14: 500 mL

## 2023-09-14 MED ORDER — LIDOCAINE HCL (PF) 1 % IJ SOLN
INTRAMUSCULAR | Status: DC | PRN
  Administered 2023-09-14: 2 mL via INTRADERMAL

## 2023-09-14 MED ORDER — HEPARIN SODIUM (PORCINE) 1000 UNIT/ML IJ SOLN
INTRAMUSCULAR | Status: DC | PRN
  Administered 2023-09-14: 10000 [IU] via INTRAVENOUS
  Administered 2023-09-14: 3000 [IU] via INTRAVENOUS

## 2023-09-14 MED ORDER — HYDRALAZINE HCL 20 MG/ML IJ SOLN: 5.0000 mg | INTRAMUSCULAR | Status: AC | PRN

## 2023-09-14 MED ORDER — MIDAZOLAM HCL 2 MG/2ML IJ SOLN
INTRAMUSCULAR | Status: AC
Start: 2023-09-14 — End: ?
  Filled 2023-09-14: qty 2

## 2023-09-14 MED ORDER — SPIRONOLACTONE 25 MG PO TABS
25.0000 mg | ORAL_TABLET | Freq: Every day | ORAL | Status: DC
  Administered 2023-09-15: 25 mg via ORAL
  Filled 2023-09-14: qty 1

## 2023-09-14 MED ORDER — TICAGRELOR 90 MG PO TABS
ORAL_TABLET | ORAL | Status: AC
  Filled 2023-09-14: qty 2

## 2023-09-14 MED ORDER — MIDAZOLAM HCL 2 MG/2ML IJ SOLN
INTRAMUSCULAR | Status: DC | PRN
  Administered 2023-09-14: 2 mg via INTRAVENOUS

## 2023-09-14 MED ORDER — ACETAMINOPHEN 325 MG PO TABS: 650.0000 mg | ORAL_TABLET | ORAL | Status: DC | PRN

## 2023-09-14 MED ORDER — NITROGLYCERIN 0.4 MG SL SUBL
0.4000 mg | SUBLINGUAL_TABLET | SUBLINGUAL | Status: DC | PRN
  Administered 2023-09-14: 0.4 mg via SUBLINGUAL
  Filled 2023-09-14: qty 1

## 2023-09-14 MED ORDER — CARVEDILOL 6.25 MG PO TABS
6.2500 mg | ORAL_TABLET | Freq: Two times a day (BID) | ORAL | Status: DC
  Administered 2023-09-15: 6.25 mg via ORAL
  Filled 2023-09-14: qty 1

## 2023-09-14 MED ORDER — SODIUM CHLORIDE 0.9% FLUSH
3.0000 mL | Freq: Two times a day (BID) | INTRAVENOUS | Status: DC
  Administered 2023-09-15: 3 mL via INTRAVENOUS

## 2023-09-14 MED ORDER — ASPIRIN 81 MG PO CHEW
81.0000 mg | CHEWABLE_TABLET | Freq: Every day | ORAL | Status: DC
  Administered 2023-09-14 – 2023-09-15 (×2): 81 mg via ORAL
  Filled 2023-09-14 (×2): qty 1

## 2023-09-14 MED ORDER — LIDOCAINE HCL (PF) 1 % IJ SOLN
INTRAMUSCULAR | Status: AC
  Filled 2023-09-14: qty 30

## 2023-09-14 MED ORDER — DIAZEPAM 5 MG PO TABS: 10.0000 mg | ORAL_TABLET | Freq: Three times a day (TID) | ORAL | Status: DC | PRN

## 2023-09-14 MED ORDER — VERAPAMIL HCL 2.5 MG/ML IV SOLN
INTRAVENOUS | Status: DC | PRN
  Administered 2023-09-14: 10 mL via INTRA_ARTERIAL

## 2023-09-14 SURGICAL SUPPLY — 18 items
BALLN EMERGE MR 3.0X20 (BALLOONS) ×1 IMPLANT
BALLN ~~LOC~~ EMERGE MR 3.5X12 (BALLOONS) ×1 IMPLANT
BALLOON EMERGE MR 3.0X20 (BALLOONS) IMPLANT
BALLOON ~~LOC~~ EMERGE MR 3.5X12 (BALLOONS) IMPLANT
CATH INFINITI AMBI 5FR TG (CATHETERS) IMPLANT
CATH LAUNCHER 6FR JR4 (CATHETERS) IMPLANT
CATH VISTA GUIDE 6FR XBLAD3.5 (CATHETERS) IMPLANT
DEVICE RAD COMP TR BAND LRG (VASCULAR PRODUCTS) IMPLANT
GLIDESHEATH SLEND A-KIT 6F 22G (SHEATH) IMPLANT
GUIDEWIRE INQWIRE 1.5J.035X260 (WIRE) IMPLANT
INQWIRE 1.5J .035X260CM (WIRE) ×1 IMPLANT
KIT ENCORE 26 ADVANTAGE (KITS) IMPLANT
KIT SYRINGE INJ CVI SPIKEX1 (MISCELLANEOUS) IMPLANT
PACK CARDIAC CATHETERIZATION (CUSTOM PROCEDURE TRAY) ×1 IMPLANT
SET ATX-X65L (MISCELLANEOUS) IMPLANT
STENT SYNERGY XD 3.50X24 (Permanent Stent) IMPLANT
SYNERGY XD 3.50X24 (Permanent Stent) ×1 IMPLANT
WIRE RUNTHROUGH .014X180CM (WIRE) IMPLANT

## 2023-09-14 NOTE — Progress Notes (Signed)
eLink Physician-Brief Progress Note Patient Name: Devin Wright DOB: 1971/08/14 MRN: [id number]   Date of Service  09/14/2023  HPI/Events of Note  52 yr old male with hx of CAD presented with chest pain and diaphoresis.  EKG with ST elevation inferolaterally with reciprocal ST depression in the anterior leads.  Patient emergently brought to the cardiac catheterization lab with stenting distal RCA.  eICU Interventions  Chart reviewed     Intervention Category Evaluation Type: New Patient Evaluation  Henry Russel, P 09/14/2023, 10:59 PM

## 2023-09-14 NOTE — H&P (Signed)
Devin Wright is an 52 y.o. male.   Chief Complaint: Chest pain HPI: Caucasian male patient with history of hypercholesterolemia, hypertension, elevated Lp(a), anterior wall STEMI on 12/21/2018 SP angioplasty and stenting to mid LAD presenting with chest discomfort that started around 5 PM today.  Chest pain described as chest heaviness in the middle of the chest, associated with marked diaphoresis.  Patient had similar experience in 2020 and hence felt that this was related to coronary disease and activated EMS.  And hence felt that this was related to coronary disease and activated EMS.  Patient patient is abnormal nitroglycerin en route.  EKG reviewed with ST elevation inferolaterally with reciprocal ST depression in the anterior leads.  Patient emergently brought to the cardiac catheterization lab.  Patient during cardiac catheterization suddenly felt better with resolution of ST segment.  Past Medical History:  Diagnosis Date   Coronary artery disease    Family history of heart disease    HTN (hypertension)    Hyperlipidemia    Palpitations    STEMI (ST elevation myocardial infarction) (HCC)    PCI/DES to p/mLAD, 60% lcx, 40% ostial LM, and 60% PDA, EF 45%    Past Surgical History:  Procedure Laterality Date   CORONARY/GRAFT ACUTE MI REVASCULARIZATION N/A 12/21/2018   Procedure: Coronary/Graft Acute MI Revascularization;  Surgeon: Lyn Records, MD;  Location: [facility] INVASIVE CV LAB;  Service: Cardiovascular;  Laterality: N/A;   LEFT HEART CATH AND CORONARY ANGIOGRAPHY N/A 12/21/2018   Procedure: LEFT HEART CATH AND CORONARY ANGIOGRAPHY;  Surgeon: Lyn Records, MD;  Location: [facility] INVASIVE CV LAB;  Service: Cardiovascular;  Laterality: N/A;    Family History  Problem Relation Age of Onset   Heart disease Mother    Heart disease Father 21       CABG   Hypertension Father    Aortic aneurysm Father 24   Skin cancer Maternal Grandmother    Heart failure Maternal Grandfather    COPD  Maternal Grandfather    Diabetes Paternal Grandmother    Social History:  reports that he has never smoked. He has never used smokeless tobacco. He reports that he does not use drugs. No history on file for alcohol use.  Allergies: No Known Allergies  Medications Prior to Admission  Medication Sig Dispense Refill   atorvastatin (LIPITOR) 40 MG tablet Take 1 tablet (40 mg total) by mouth daily. 90 tablet 3   carvedilol (COREG) 6.25 MG tablet Take 1 tablet (6.25 mg total) by mouth 2 (two) times daily with a meal. 180 tablet 3   clopidogrel (PLAVIX) 75 MG tablet Take 1 tablet (75 mg total) by mouth daily. 90 tablet 3   losartan (COZAAR) 50 MG tablet Take 1 tablet (50 mg total) by mouth daily. 90 tablet 3   nitroGLYCERIN (NITROSTAT) 0.4 MG SL tablet Place 1 tablet (0.4 mg total) under the tongue every 5 (five) minutes as needed for chest pain. 75 tablet 2   PRALUENT 150 MG/ML SOAJ Inject 1 pen under the skin every 14 days. 6 mL 2   sildenafil (REVATIO) 20 MG tablet Take 40-100 mg by mouth daily as needed.     spironolactone (ALDACTONE) 25 MG tablet Take 0.5 tablets (12.5 mg total) by mouth daily. 45 tablet 3    Results for orders placed or performed during the hospital encounter of 09/14/23 (from the past 48 hour(s))  Hemoglobin A1c     Status: None   Collection Time: 09/14/23  7:00 PM  Result  Value Ref Range   Hgb A1c MFr Bld 5.0 4.8 - 5.6 %    Comment: (NOTE) Pre diabetes:          5.7%-6.4%  Diabetes:              >6.4%  Glycemic control for   <7.0% adults with diabetes    Mean Plasma Glucose 96.8 mg/dL    Comment: Performed at [facility], [address], [place], [place] [address]  CBC with Differential/Platelet     Status: Abnormal   Collection Time: 09/14/23  7:00 PM  Result Value Ref Range   WBC 10.0 4.0 - 10.5 K/uL   RBC 5.32 4.22 - 5.81 MIL/uL   Hemoglobin 15.7 13.0 - 17.0 g/dL   HCT 25.3 66.4 - 40.3 %   MCV 83.1 80.0 - 100.0 fL   MCH 29.5 26.0 - 34.0 pg   MCHC  35.5 30.0 - 36.0 g/dL   RDW 47.4 25.9 - 56.3 %   Platelets 221 150 - 400 K/uL   nRBC 0.0 0.0 - 0.2 %   Neutrophils Relative % 77 %   Neutro Abs 7.8 (H) 1.7 - 7.7 K/uL   Lymphocytes Relative 12 %   Lymphs Abs 1.2 0.7 - 4.0 K/uL   Monocytes Relative 8 %   Monocytes Absolute 0.8 0.1 - 1.0 K/uL   Eosinophils Relative 1 %   Eosinophils Absolute 0.1 0.0 - 0.5 K/uL   Basophils Relative 1 %   Basophils Absolute 0.1 0.0 - 0.1 K/uL   Immature Granulocytes 1 %   Abs Immature Granulocytes 0.05 0.00 - 0.07 K/uL    Comment: Performed at [facility], [address] [place], [place] [address]  I-STAT, [place] [address]     Status: Abnormal   Collection Time: 09/14/23  7:02 PM  Result Value Ref Range   Sodium 139 135 - 145 mmol/L   Potassium 4.1 3.5 - 5.1 mmol/L   Chloride 105 98 - 111 mmol/L   BUN 17 6 - 20 mg/dL   Creatinine, Ser 3.32 (H) 0.61 - 1.24 mg/dL   Glucose, Bld 951 (H) 70 - 99 mg/dL    Comment: Glucose reference range applies only to samples taken after fasting for at least 8 hours.   Calcium, Ion 1.13 (L) 1.15 - 1.40 mmol/L   TCO2 22 22 - 32 mmol/L   Hemoglobin 15.0 13.0 - 17.0 g/dL   HCT 88.4 16.6 - 06.3 %  CG4 I-STAT (Lactic acid)     Status: None   Collection Time: 09/14/23  7:03 PM  Result Value Ref Range   Lactic Acid, Venous 1.7 0.5 - 1.9 mmol/L  POCT Activated clotting time     Status: None   Collection Time: 09/14/23  7:09 PM  Result Value Ref Range   Activated Clotting Time 296 seconds    Comment: Reference range 74-137 seconds for patients not on anticoagulant therapy.   No results found.  Review of Systems  Cardiovascular:  Positive for chest pain. Negative for dyspnea on exertion and leg swelling.   Afebrile, respiratory rate 16/min, blood pressure 139/79 mmHg.  SpO2 96%.  Physical Exam Constitutional:      Appearance: He is obese.  Neck:     Vascular: No carotid bruit or JVD.  Cardiovascular:     Rate and Rhythm: Normal rate and regular rhythm.      Pulses: Intact distal pulses.     Heart sounds: Normal heart sounds. No murmur heard.  No gallop.  Pulmonary:     Effort: Pulmonary effort is normal.     Breath sounds: Normal breath sounds.  Abdominal:     General: Bowel sounds are normal.     Palpations: Abdomen is soft.  Musculoskeletal:     Right lower leg: No edema.     Left lower leg: No edema.    EMS EKG 09/14/2023 at 1815 hrs.: Normal sinus rhythm at rate of 84 bpm, tombstone ST elevation in inferior leads and lateral leads of 4 mm, 2 mm ST depression in the anterior leads V1 and V2, aVL.  Acute inferolateral STEMI.  Assessment/Plan 1.  Acute inferolateral STEMI 2.  Primary hypertension 3.  Mixed hypercholesterolemia 4.  Elevated Lp(a) in 2020, was 203. 5.  Obesity  Recommendation: Patient emergently to be taken to cardiac catheterization lab and further recommendations to follow.  Yates Decamp, MD 09/14/2023, 7:59 PM

## 2023-09-14 NOTE — Progress Notes (Signed)
STEMI page. Chaplain meets pt at registration desk before he's taken to cath lab. He's in good spirits and shares that his wife is on her way. He denies needs and expresses knowing the Shaune Pollack is watching over him.

## 2023-09-15 ENCOUNTER — Inpatient Hospital Stay (HOSPITAL_COMMUNITY): Payer: BC Managed Care – PPO

## 2023-09-15 ENCOUNTER — Other Ambulatory Visit (HOSPITAL_COMMUNITY): Payer: Self-pay

## 2023-09-15 ENCOUNTER — Telehealth: Payer: Self-pay | Admitting: Cardiology

## 2023-09-15 DIAGNOSIS — I2119 ST elevation (STEMI) myocardial infarction involving other coronary artery of inferior wall: Secondary | ICD-10-CM

## 2023-09-15 DIAGNOSIS — I2510 Atherosclerotic heart disease of native coronary artery without angina pectoris: Secondary | ICD-10-CM | POA: Diagnosis not present

## 2023-09-15 LAB — ECHOCARDIOGRAM COMPLETE
AR max vel: 3.28 cm2
AV Peak grad: 5.4 mm[Hg]
Ao pk vel: 1.16 m/s
Area-P 1/2: 3.08 cm2
Calc EF: 53.5 %
Height: 74 in
MV M vel: 2.31 m/s
MV Peak grad: 21.3 mm[Hg]
S' Lateral: 3.9 cm
Single Plane A2C EF: 55.1 %
Single Plane A4C EF: 54 %
Weight: 4585.57 [oz_av]

## 2023-09-15 LAB — CBC
HCT: 44.4 % (ref 39.0–52.0)
Hemoglobin: 15.2 g/dL (ref 13.0–17.0)
MCH: 29.2 pg (ref 26.0–34.0)
MCHC: 34.2 g/dL (ref 30.0–36.0)
MCV: 85.2 fL (ref 80.0–100.0)
Platelets: 201 10*3/uL (ref 150–400)
RBC: 5.21 MIL/uL (ref 4.22–5.81)
RDW: 12.2 % (ref 11.5–15.5)
WBC: 9.5 10*3/uL (ref 4.0–10.5)
nRBC: 0 % (ref 0.0–0.2)

## 2023-09-15 LAB — BASIC METABOLIC PANEL
Anion gap: 9 (ref 5–15)
BUN: 13 mg/dL (ref 6–20)
CO2: 22 mmol/L (ref 22–32)
Calcium: 8.2 mg/dL — ABNORMAL LOW (ref 8.9–10.3)
Chloride: 107 mmol/L (ref 98–111)
Creatinine, Ser: 1.04 mg/dL (ref 0.61–1.24)
GFR, Estimated: >60 mL/min (ref >60)
Glucose, Bld: 101 mg/dL — ABNORMAL HIGH (ref 70–99)
Potassium: 3.8 mmol/L (ref 3.5–5.1)
Sodium: 138 mmol/L (ref 135–145)

## 2023-09-15 LAB — MRSA NEXT GEN BY PCR, NASAL: MRSA by PCR Next Gen: NOT DETECTED

## 2023-09-15 LAB — POCT ACTIVATED CLOTTING TIME: Activated Clotting Time: 222 s

## 2023-09-15 MED ORDER — ASPIRIN 81 MG PO CHEW
81.0000 mg | CHEWABLE_TABLET | Freq: Every day | ORAL | 2 refills | Status: DC
  Filled 2023-09-15: qty 90, 90d supply, fill #0

## 2023-09-15 MED ORDER — TICAGRELOR 90 MG PO TABS
90.0000 mg | ORAL_TABLET | Freq: Two times a day (BID) | ORAL | 2 refills | Status: DC
  Filled 2023-09-15: qty 60, 30d supply, fill #0

## 2023-09-15 MED ORDER — TICAGRELOR 90 MG PO TABS
90.0000 mg | ORAL_TABLET | Freq: Two times a day (BID) | ORAL | Status: DC
  Administered 2023-09-15: 90 mg via ORAL
  Filled 2023-09-15: qty 1

## 2023-09-15 NOTE — Care Management (Signed)
Confirmed w nurse that patient has Brilinta copay card

## 2023-09-15 NOTE — Progress Notes (Signed)
Echocardiogram 2D Echocardiogram has been performed.  Lucendia Herrlich 09/15/2023, 11:27 AM

## 2023-09-15 NOTE — Progress Notes (Signed)
CARDIAC REHAB PHASE I   PRE:  Rate/Rhythm: 74 SR   BP:  Sitting: 124/92   SaO2: 96 RA  MODE:  Ambulation: 370 ft   POST:  Rate/Rhythm: 71 SR, occ PVC  BP:  Sitting: 134/88   SaO2: 97 RA  Pt received in recliner, feeling well overnight. Educated on risk factors, exercise guidelines, angina/NTG, stent, ASA and Brilinta, MI booklet, restrictions, nutrition, and CRP2. Referral placed to [facility], pt is very eager. Pt amb in hallway independently with standby assist, tolerated well with no s/sx. Occasional PVC noted on telemetry during walk, pt asymptomatic. Returned to recliner, all questions answered. [id number]   Jonna Coup, MS, ACSM-CEP 09/15/2023 9:07 AM

## 2023-09-15 NOTE — Telephone Encounter (Signed)
   Transition of Care Follow-up Phone Call Request    Patient Name: Devin Wright Date of Birth: 1970-12-22 Date of Encounter: 09/15/2023  Primary Care Provider:  Merri Brunette, MD Primary Cardiologist:  Yates Decamp, MD  Devin Wright has been scheduled for a transition of care follow up appointment with a [facility] provider:  Jari Favre 12/5  Please reach out to Devin Wright within 48 hours of discharge to confirm appointment and review transition of care protocol questionnaire. Anticipated discharge date: 11/23  Devin Page, NP  09/15/2023, 9:35 AM

## 2023-09-15 NOTE — Progress Notes (Signed)
   Patient Name: Devin Wright Date of Encounter: 09/15/2023 [facility] Cardiologist: Lesleigh Noe, MD (Inactive) new, Dr. Jacinto Halim  Interval Summary  .    52 year old male with inferior ST elevation myocardial infarction troponin greater than 24,000 with stenting to the distal RCA after experiencing chest pain and diaphoresis  Has known LP(a) elevation  Had similar episode in 2020 which resulted in PCI to proximal/mid LAD, except this time instead of an elephant on his chest it was more of a squeezing sensation with diaphoresis.  He was out in the cold attaching tractor to trailer.  Ended up going home took nitroglycerin and then called 911  Reciprocal ST changes noted in the anterior leads    Vital Signs .    Vitals:   09/15/23 0400 09/15/23 0600 09/15/23 0700 09/15/23 0800  BP: 126/88 128/82 118/76 118/81  Pulse: 66 68 69 75  Resp: (!) 21 (!) 23 14 17   Temp:      TempSrc:      SpO2: 94% 93% 94% 94%  Weight:        Intake/Output Summary (Last 24 hours) at 09/15/2023 0825 Last data filed at 09/15/2023 0300 Gross per 24 hour  Intake 436.77 ml  Output 1000 ml  Net -563.23 ml      09/14/2023    7:57 PM 10/19/2022    8:37 AM 06/23/2021    3:33 PM  Last 3 Weights  Weight (lbs) 286 lb 9.6 oz 286 lb 3.2 oz 281 lb 3.2 oz  Weight (kg) 130 kg 129.819 kg 127.551 kg      Telemetry/ECG    5 beats nonsustained ventricular tachycardia, otherwise normal sinus rhythm/sinus bradycardia- Personally Reviewed  Physical Exam .   GEN: No acute distress.   Neck: No JVD Cardiac: RRR, no murmurs, rubs, or gallops.  Cath site clean dry and intact Respiratory: Clear to auscultation bilaterally. GI: Soft, nontender, non-distended  MS: No edema  Assessment & Plan .     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  Jacinto Halim -Continue with aggressive goal-directed medical therapy -Cardiac rehab when able -Dual antiplatelet therapy, aspirin 81 mg, ticagrelor (Brilinta) -Normal LV function reported per catheterization     Hyperlipidemia - Continue with atorvastatin high intensity dose 40 mg a day with LDL goal of less than 55 -Noteworthy for elevated LP(a).  As outpatient, appears that he has been on Praluent from last office check in December 2023 for added risk reduction.  Primary hypertension - Continue with losartan 50 mg a day carvedilol 6.25 mg twice a day spironolactone 25 mg a day  If echocardiogram does not get done today, can always approach as outpatient.  LVEF was reportedly normal on cardiac catheterization.  No evidence of sustained ventricular tachycardia.  Cardiac rehab is talking with them currently.  We discussed precautions.  Discussed with nursing team.  Would be okay with discharge later this afternoon if he is comfortable.  We will have close follow-up.  For questions or updates, please contact [facility] Please consult [email or web address] for contact info under        Signed, Donato Schultz, MD

## 2023-09-15 NOTE — Discharge Summary (Cosign Needed)
Discharge Summary    Patient ID: Devin Wright MRN: [id number]; DOB: 1971/02/07  Admit date: 09/14/2023 Discharge date: 09/15/2023  PCP:  Merri Brunette, MD   [facility] Providers Cardiologist:  Yates Decamp, MD     Discharge Diagnoses    Principal Problem:   Acute ST elevation myocardial infarction (STEMI) of inferolateral wall [facility]) Active Problems:   Essential hypertension   Hyperlipidemia LDL goal <70  Diagnostic Studies/Procedures    Cath: 09/14/2023  Left Heart Catheterization and angioplasty 09/14/23: Hemodynamic data: LV 1 and 26/26, EDP 29 mmHg.  Ao 137/88, mean 109 mmHg.  No pressure gradient across the aortic valve.   Angiographic data: LV: Normal LV systolic function.  LV function not adequately evaluated for wall motion abnormality due to  PVCs. LM: Large-caliber vessel, distal left main has a 20% stenosis.  Long vessel. LAD: Large-caliber vessel, gives origin to very small diagonal branches.  3.0 x 32 mm Synergy DES placed in the mid LAD is widely patent from 12/21/2018. RI: It is a moderate caliber vessel but a very large vessel and has a secondary branch.  Proximal segment has diffuse disease about 20 to 30%. LCx: Small vessel, has a 70% ostial and proximal stenosis, gives origin to high OM1 and continues in the AV groove distally. RCA: Very large caliber vessel, has a ostial 30% stenosis.  It gives origin to a small PDA and continues as a large PL branch with secondary branches.  There is ulcerated 80% stenosis after the origin of PDA and secondary PL branch is occluded with thrombus, small vessel.   Intervention data: Successful stenting of the distal RCA with implantation of a 3.5 x 24 mm Synergy XD DES deployed at 13 atmospheric pressure for 60 seconds and proximal optimization with 3.5 x 12 mm [place] balloon at 20 atmospheric pressure for 60 seconds.  Stenosis reduced from 80% to 0% with TIMI-3 to TIMI-3 flow.  There was also improvement in PL branch and  secondary PL branch postangioplasty.  Impression and recommendations: Patient's STEMI resolved in the middle of the cardiac catheterization with improvement in symptoms of chest pain and ST segment elevations.  However the distal RCA stenosis which was the culprit appeared hazy and high-grade and also had thrombotic embolization distally into secondary small PL branch hence I felt the lesion was significant and have to proceed with PCI.   Patient will potentially be discharged home in 24 to 48 hours depending upon his recovery.  He will need aggressive risk factor modification.  Will also look at evaluation for Lp(a) clinical trials.  Echo: 09/15/2023   IMPRESSIONS     1. Left ventricular ejection fraction, by estimation, is 60 to 65%. The  left ventricle has normal function. The left ventricle demonstrates  regional wall motion abnormalities (see scoring diagram/findings for  description). There is mild asymmetric left  ventricular hypertrophy of the inferior segment. Left ventricular  diastolic parameters are indeterminate.   2. Right ventricular systolic function is normal. The right ventricular  size is normal.   3. The mitral valve is normal in structure. Trivial mitral valve  regurgitation. No evidence of mitral stenosis.   4. The aortic valve is normal in structure. Aortic valve regurgitation is  not visualized. Aortic valve sclerosis/calcification is present, without  any evidence of aortic stenosis.   5. There is borderline dilatation of the aortic root, measuring 38 mm.   6. The inferior vena cava is normal in size with greater than 50%  respiratory variability, suggesting right atrial pressure of 3 mmHg.   FINDINGS   Left Ventricle: Left ventricular ejection fraction, by estimation, is 60  to 65%. The left ventricle has normal function. The left ventricle  demonstrates regional wall motion abnormalities. The left ventricular  internal cavity size was normal in size.   There is mild asymmetric left ventricular hypertrophy of the inferior  segment. Left ventricular diastolic parameters are indeterminate.     LV Wall Scoring:  The apex is akinetic. The mid and distal anterior septum, apical lateral  segment, and apical inferior segment are hypokinetic. The entire anterior  wall, antero-lateral wall, inferior wall, posterior wall, basal  anteroseptal  segment, mid inferoseptal segment, and basal inferoseptal segment are  normal.   Right Ventricle: The right ventricular size is normal. No increase in  right ventricular wall thickness. Right ventricular systolic function is  normal.   Left Atrium: Left atrial size was normal in size.   Right Atrium: Right atrial size was normal in size.   Pericardium: There is no evidence of pericardial effusion. Presence of  epicardial fat layer.   Mitral Valve: The mitral valve is normal in structure. Trivial mitral  valve regurgitation. No evidence of mitral valve stenosis.   Tricuspid Valve: The tricuspid valve is normal in structure. Tricuspid  valve regurgitation is not demonstrated. No evidence of tricuspid  stenosis.   Aortic Valve: The aortic valve is normal in structure. Aortic valve  regurgitation is not visualized. Aortic valve sclerosis/calcification is  present, without any evidence of aortic stenosis. Aortic valve peak  gradient measures 5.4 mmHg.   Pulmonic Valve: The pulmonic valve was normal in structure. Pulmonic valve  regurgitation is not visualized. No evidence of pulmonic stenosis.   Aorta: There is borderline dilatation of the aortic root, measuring 38 mm.   Venous: The inferior vena cava is normal in size with greater than 50%  respiratory variability, suggesting right atrial pressure of 3 mmHg.   IAS/Shunts: No atrial level shunt detected by color flow Doppler.  _____________   History of Present Illness     [name] is a 52 y.o. male with history of hypercholesterolemia,  hypertension, elevated Lp(a), anterior wall STEMI on 12/21/2018 s/p angioplasty and stenting to mid LAD presented with chest discomfort that started around 5 PM the day of admission.   Chest pain described as chest heaviness in the middle of the chest, associated with marked diaphoresis.  Patient had similar experience in 2020 and hence felt that this was related to coronary disease and activated EMS.  And felt that this was related to coronary disease and activated EMS. He was given SL nitroglycerin en route.  EKG reviewed with ST elevation inferolaterally with reciprocal ST depression in the anterior leads.   Patient emergently brought to the cardiac catheterization lab.  Patient during cardiac catheterization suddenly felt better with resolution of ST segment.  Hospital Course     Inferior STEMI -- Underwent cardiac catheterization noted above with successful stenting of distal RCA with DES x 1, previously placed LAD stent patent, 70% proximal circumflex (small vessel to be treated medically).  High-sensitivity troponin peaked at greater than 24,000. Recommendations for DAPT with aspirin/Brilinta for at least 1 year.  Seen by cardiac rehab.  Echocardiogram showed LVEF of 60 to 65%, mild LVH, normal RV, trivial MR, akinesis of the apex with hypokinesis of the mid/distal anterior septum, apical lateral segment and apical inferior segment. -- Continue aspirin, Brilinta, carvedilol 6.25 mg twice daily,  losartan 50 mg daily, spironolactone 12.5 mg daily  Hyperlipidemia -- LDL 8, HDL 27 -- On atorvastatin 40 mg daily, Praluent PTA  Hypertension -- Continue losartan 50 mg daily, carvedilol 6.25 mg twice daily, spironolactone 25 mg daily  Patient seen by Dr. Anne Fu and deemed stable for discharge home. Follow up arranged in the office. Medications sent to the [facility] pharmacy prior to discharge.   Did the patient have an acute coronary syndrome (MI, NSTEMI, STEMI, etc) this admission?:  Yes                                AHA/ACC ACS Clinical Performance & Quality Measures: Aspirin prescribed? - Yes ADP Receptor Inhibitor (Plavix/Clopidogrel, Brilinta/Ticagrelor or Effient/Prasugrel) prescribed (includes medically managed patients)? - Yes Beta Blocker prescribed? - Yes High Intensity Statin (Lipitor 40-80mg  or Crestor 20-40mg ) prescribed? - Yes EF assessed during THIS hospitalization? - Yes For EF <40%, was ACEI/ARB prescribed? - Yes For EF <40%, Aldosterone Antagonist (Spironolactone or Eplerenone) prescribed? - Yes Cardiac Rehab Phase II ordered (including medically managed patients)? - Yes   The patient will be scheduled for a TOC follow up appointment in 10-14 days.  A message has been sent to the [facility] and Scheduling Pool at the office where the patient should be seen for follow up.  _____________  Discharge Vitals Blood pressure (!) 116/90, pulse 73, temperature 98.7 F (37.1 C), temperature source Oral, resp. rate 16, height 6\' 2"  (1.88 m), weight 130 kg, SpO2 95%.  Filed Weights   09/14/23 1957  Weight: 130 kg    Labs & Radiologic Studies    CBC Recent Labs    09/14/23 1900 09/14/23 1902 09/15/23 0215  WBC 10.0  --  9.5  NEUTROABS 7.8*  --   --   HGB 15.7 15.0 15.2  HCT 44.2 44.0 44.4  MCV 83.1  --  85.2  PLT 221  --  201   Basic Metabolic Panel Recent Labs    25/42/70 1900 09/14/23 1902 09/15/23 0215  NA 137 139 138  K 4.1 4.1 3.8  CL 106 105 107  CO2 22  --  22  GLUCOSE 103* 109* 101*  BUN 14 17 13   CREATININE 1.32* 1.30* 1.04  CALCIUM 8.4*  --  8.2*   Liver Function Tests Recent Labs    09/14/23 1900  AST 32  ALT 54*  ALKPHOS 91  BILITOT 0.6  PROT 6.0*  ALBUMIN 3.7   No results for input(s): "LIPASE", "AMYLASE" in the last 72 hours. High Sensitivity Troponin:   Recent Labs  Lab 09/14/23 1900 09/14/23 2215  TROPONINIHS 168* >24,000*    BNP Invalid input(s): "POCBNP" D-Dimer No results for input(s): "DDIMER" in the last 72  hours. Hemoglobin A1C Recent Labs    09/14/23 1900  HGBA1C 5.0   Fasting Lipid Panel Recent Labs    09/14/23 1900  CHOL 82  HDL 27*  LDLCALC 8  TRIG 235*  CHOLHDL 3.0   Thyroid Function Tests No results for input(s): "TSH", "T4TOTAL", "T3FREE", "THYROIDAB" in the last 72 hours.  Invalid input(s): "FREET3" _____________  ECHOCARDIOGRAM COMPLETE  Result Date: 09/15/2023    ECHOCARDIOGRAM REPORT   Patient Name:   Emmanuell R Wright Date of Exam: 09/15/2023 Medical Rec #:  [id number]      Height:       74.0 in Accession #:    [id number]     Weight:  286.6 lb Date of Birth:  08-Apr-1971       BSA:          2.533 m Patient Age:    52 years       BP:           118/81 mmHg Patient Gender: M              HR:           67 bpm. Exam Location:  Inpatient Procedure: 2D Echo, Cardiac Doppler and Color Doppler Indications:    CAD Native Vessel I25.10  History:        Patient has prior history of Echocardiogram examinations, most                 recent 08/29/2021. CHF and Cardiomyopathy, Acute MI and CAD; Risk                 Factors:Hypertension and Dyslipidemia.  Sonographer:    Lucendia Herrlich RCS Referring Phys: [id number] Yates Decamp IMPRESSIONS  1. Left ventricular ejection fraction, by estimation, is 60 to 65%. The left ventricle has normal function. The left ventricle demonstrates regional wall motion abnormalities (see scoring diagram/findings for description). There is mild asymmetric left ventricular hypertrophy of the inferior segment. Left ventricular diastolic parameters are indeterminate.  2. Right ventricular systolic function is normal. The right ventricular size is normal.  3. The mitral valve is normal in structure. Trivial mitral valve regurgitation. No evidence of mitral stenosis.  4. The aortic valve is normal in structure. Aortic valve regurgitation is not visualized. Aortic valve sclerosis/calcification is present, without any evidence of aortic stenosis.  5. There is borderline dilatation of  the aortic root, measuring 38 mm.  6. The inferior vena cava is normal in size with greater than 50% respiratory variability, suggesting right atrial pressure of 3 mmHg. FINDINGS  Left Ventricle: Left ventricular ejection fraction, by estimation, is 60 to 65%. The left ventricle has normal function. The left ventricle demonstrates regional wall motion abnormalities. The left ventricular internal cavity size was normal in size. There is mild asymmetric left ventricular hypertrophy of the inferior segment. Left ventricular diastolic parameters are indeterminate.  LV Wall Scoring: The apex is akinetic. The mid and distal anterior septum, apical lateral segment, and apical inferior segment are hypokinetic. The entire anterior wall, antero-lateral wall, inferior wall, posterior wall, basal anteroseptal segment, mid inferoseptal segment, and basal inferoseptal segment are normal. Right Ventricle: The right ventricular size is normal. No increase in right ventricular wall thickness. Right ventricular systolic function is normal. Left Atrium: Left atrial size was normal in size. Right Atrium: Right atrial size was normal in size. Pericardium: There is no evidence of pericardial effusion. Presence of epicardial fat layer. Mitral Valve: The mitral valve is normal in structure. Trivial mitral valve regurgitation. No evidence of mitral valve stenosis. Tricuspid Valve: The tricuspid valve is normal in structure. Tricuspid valve regurgitation is not demonstrated. No evidence of tricuspid stenosis. Aortic Valve: The aortic valve is normal in structure. Aortic valve regurgitation is not visualized. Aortic valve sclerosis/calcification is present, without any evidence of aortic stenosis. Aortic valve peak gradient measures 5.4 mmHg. Pulmonic Valve: The pulmonic valve was normal in structure. Pulmonic valve regurgitation is not visualized. No evidence of pulmonic stenosis. Aorta: There is borderline dilatation of the aortic root,  measuring 38 mm. Venous: The inferior vena cava is normal in size with greater than 50% respiratory variability, suggesting right atrial pressure of 3 mmHg. IAS/Shunts: No  atrial level shunt detected by color flow Doppler.  LEFT VENTRICLE PLAX 2D LVIDd:         5.20 cm      Diastology LVIDs:         3.90 cm      LV e' medial:    10.10 cm/s LV PW:         1.30 cm      LV E/e' medial:  4.6 LV IVS:        0.90 cm      LV e' lateral:   11.30 cm/s LVOT diam:     2.40 cm      LV E/e' lateral: 4.1 LV SV:         75 LV SV Index:   30 LVOT Area:     4.52 cm  LV Volumes (MOD) LV vol d, MOD A2C: 125.0 ml LV vol d, MOD A4C: 131.0 ml LV vol s, MOD A2C: 56.1 ml LV vol s, MOD A4C: 60.3 ml LV SV MOD A2C:     68.9 ml LV SV MOD A4C:     131.0 ml LV SV MOD BP:      68.6 ml RIGHT VENTRICLE             IVC RV S prime:     12.00 cm/s  IVC diam: 1.60 cm TAPSE (M-mode): 1.9 cm LEFT ATRIUM             Index        RIGHT ATRIUM           Index LA diam:        3.80 cm 1.50 cm/m   RA Area:     16.20 cm LA Vol (A2C):   66.9 ml 26.42 ml/m  RA Volume:   40.60 ml  16.03 ml/m LA Vol (A4C):   45.1 ml 17.81 ml/m LA Biplane Vol: 57.0 ml 22.51 ml/m  AORTIC VALVE AV Area (Vmax): 3.28 cm AV Vmax:        116.00 cm/s AV Peak Grad:   5.4 mmHg LVOT Vmax:      84.00 cm/s LVOT Vmean:     54.200 cm/s LVOT VTI:       0.166 m  AORTA Ao Root diam: 3.80 cm Ao Asc diam:  3.40 cm MITRAL VALVE               TRICUSPID VALVE MV Area (PHT): 3.08 cm    TR Peak grad:   12.5 mmHg MV Decel Time: 246 msec    TR Vmax:        177.00 cm/s MR Peak grad: 21.3 mmHg MR Vmax:      231.00 cm/s  SHUNTS MV E velocity: 46.60 cm/s  Systemic VTI:  0.17 m MV A velocity: 55.50 cm/s  Systemic Diam: 2.40 cm MV E/A ratio:  0.84 Kardie Tobb DO Electronically signed by Thomasene Ripple DO Signature Date/Time: 09/15/2023/12:43:34 PM    Final    CARDIAC CATHETERIZATION  Result Date: 09/14/2023 Images from the original result were not included.   Ost RCA lesion is 30% stenosed. Left Heart  Catheterization and angioplasty 09/14/23: Hemodynamic data: LV 1 and 26/26, EDP 29 mmHg.  Ao 137/88, mean 109 mmHg.  No pressure gradient across the aortic valve. Angiographic data: LV: Normal LV systolic function.  LV function not adequately evaluated for wall motion abnormality due to  PVCs. LM: Large-caliber vessel, distal left main has a 20% stenosis.  Long vessel. LAD: Large-caliber vessel, gives  origin to very small diagonal branches.  3.0 x 32 mm Synergy DES placed in the mid LAD is widely patent from 12/21/2018. RI: It is a moderate caliber vessel but a very large vessel and has a secondary branch.  Proximal segment has diffuse disease about 20 to 30%. LCx: Small vessel, has a 70% ostial and proximal stenosis, gives origin to high OM1 and continues in the AV groove distally. RCA: Very large caliber vessel, has a ostial 30% stenosis.  It gives origin to a small PDA and continues as a large PL branch with secondary branches.  There is ulcerated 80% stenosis after the origin of PDA and secondary PL branch is occluded with thrombus, small vessel. Intervention data: Successful stenting of the distal RCA with implantation of a 3.5 x 24 mm Synergy XD DES deployed at 13 atmospheric pressure for 60 seconds and proximal optimization with 3.5 x 12 mm [place] balloon at 20 atmospheric pressure for 60 seconds.  Stenosis reduced from 80% to 0% with TIMI-3 to TIMI-3 flow.  There was also improvement in PL branch and secondary PL branch postangioplasty. Impression and recommendations: Patient's STEMI resolved in the middle of the cardiac catheterization with improvement in symptoms of chest pain and ST segment elevations.  However the distal RCA stenosis which was the culprit appeared hazy and high-grade and also had thrombotic embolization distally into secondary small PL branch hence I felt the lesion was significant and have to proceed with PCI. Patient will potentially be discharged home in 24 to 48 hours depending upon his  recovery.  He will need aggressive risk factor modification.  Will also look at evaluation for Lp(a) clinical trials.   Disposition   Pt is being discharged home today in good condition.  Follow-up Plans & Appointments     Discharge Instructions     Amb Referral to Cardiac Rehabilitation   Complete by: As directed    Diagnosis:  STEMI Coronary Stents     After initial evaluation and assessments completed: Virtual Based Care may be provided alone or in conjunction with Phase 2 Cardiac Rehab based on patient barriers.: Yes   Intensive Cardiac Rehabilitation (ICR) [facility] location only OR Traditional Cardiac Rehabilitation (TCR) *If criteria for ICR are not met will enroll in TCR [facility] only): Yes   Call MD for:  difficulty breathing, headache or visual disturbances   Complete by: As directed    Call MD for:  persistant dizziness or light-headedness   Complete by: As directed    Call MD for:  redness, tenderness, or signs of infection (pain, swelling, redness, odor or green/yellow discharge around incision site)   Complete by: As directed    Diet - low sodium heart healthy   Complete by: As directed    Discharge instructions   Complete by: As directed    Radial Site Care Refer to this sheet in the next few weeks. These instructions provide you with information on caring for yourself after your procedure. Your caregiver may also give you more specific instructions. Your treatment has been planned according to current medical practices, but problems sometimes occur. Call your caregiver if you have any problems or questions after your procedure. HOME CARE INSTRUCTIONS You may shower the day after the procedure. Remove the bandage (dressing) and gently wash the site with plain soap and water. Gently pat the site dry.  Do not apply powder or lotion to the site.  Do not submerge the affected site in water for 3 to 5 days.  Inspect the site at least twice daily.  Do not flex or bend the affected  arm for 24 hours.  No lifting over 5 pounds (2.3 kg) for 5 days after your procedure.  Do not drive home if you are discharged the same day of the procedure. Have someone else drive you.  You may drive 24 hours after the procedure unless otherwise instructed by your caregiver.  What to expect: Any bruising will usually fade within 1 to 2 weeks.  Blood that collects in the tissue (hematoma) may be painful to the touch. It should usually decrease in size and tenderness within 1 to 2 weeks.  SEEK IMMEDIATE MEDICAL CARE IF: You have unusual pain at the radial site.  You have redness, warmth, swelling, or pain at the radial site.  You have drainage (other than a small amount of blood on the dressing).  You have chills.  You have a fever or persistent symptoms for more than 72 hours.  You have a fever and your symptoms suddenly get worse.  Your arm becomes pale, cool, tingly, or numb.  You have heavy bleeding from the site. Hold pressure on the site.   PLEASE DO NOT MISS ANY DOSES OF YOUR BRILINTA!!!!! Also keep a log of you blood pressures and bring back to your follow up appt. Please call the office with any questions.   Patients taking blood thinners should generally stay away from medicines like ibuprofen, Advil, Motrin, naproxen, and Aleve due to risk of stomach bleeding. You may take Tylenol as directed or talk to your primary doctor about alternatives.  PLEASE ENSURE THAT YOU DO NOT RUN OUT OF YOUR BRILINTA. This medication is very important to remain on for at least one year. IF you have issues obtaining this medication due to cost please CALL the office 3-5 business days prior to running out in order to prevent missing doses of this medication.   Increase activity slowly   Complete by: As directed         Discharge Medications   Allergies as of 09/15/2023   No Known Allergies      Medication List     STOP taking these medications    clopidogrel 75 MG tablet Commonly  known as: PLAVIX       TAKE these medications    Aspirin Low Dose 81 MG chewable tablet Generic drug: aspirin Chew 1 tablet (81 mg total) by mouth daily.   atorvastatin 40 MG tablet Commonly known as: LIPITOR Take 1 tablet (40 mg total) by mouth daily.   Brilinta 90 MG Tabs tablet Generic drug: ticagrelor Take 1 tablet (90 mg total) by mouth 2 (two) times daily.   carvedilol 6.25 MG tablet Commonly known as: COREG Take 1 tablet (6.25 mg total) by mouth 2 (two) times daily with a meal.   losartan 50 MG tablet Commonly known as: COZAAR Take 1 tablet (50 mg total) by mouth daily.   nitroGLYCERIN 0.4 MG SL tablet Commonly known as: NITROSTAT Place 1 tablet (0.4 mg total) under the tongue every 5 (five) minutes as needed for chest pain.   Praluent 150 MG/ML Soaj Generic drug: Alirocumab Inject 1 pen under the skin every 14 days.   sildenafil 20 MG tablet Commonly known as: REVATIO Take 40-100 mg by mouth daily as needed.   spironolactone 25 MG tablet Commonly known as: ALDACTONE Take 0.5 tablets (12.5 mg total) by mouth daily.        Outstanding Labs/Studies   N/a  Duration of Discharge Encounter   Greater than 30 minutes including physician time.  Signed, Laverda Page, NP 09/15/2023, 12:51 PM  Personally seen and examined. Agree with above.  52 year old male with inferior ST elevation myocardial infarction troponin greater than 24,000 with stenting to the distal RCA after experiencing chest pain and diaphoresis   Has known LP(a) elevation   Had similar episode in 2020 which resulted in PCI to proximal/mid LAD, except this time instead of an elephant on his chest it was more of a squeezing sensation with diaphoresis.  He was out in the cold attaching tractor to trailer.  Ended up going home took nitroglycerin and then called 911   Reciprocal ST changes noted in the anterior leads       Vital Signs .           Vitals:    09/15/23 0400 09/15/23  0600 09/15/23 0700 09/15/23 0800  BP: 126/88 128/82 118/76 118/81  Pulse: 66 68 69 75  Resp: (!) 21 (!) 23 14 17   Temp:          TempSrc:          SpO2: 94% 93% 94% 94%  Weight:              Intake/Output Summary (Last 24 hours) at 09/15/2023 0825 Last data filed at 09/15/2023 0300    Gross per 24 hour  Intake 436.77 ml  Output 1000 ml  Net -563.23 ml        09/14/2023    7:57 PM 10/19/2022    8:37 AM 06/23/2021    3:33 PM  Last 3 Weights  Weight (lbs) 286 lb 9.6 oz 286 lb 3.2 oz 281 lb 3.2 oz  Weight (kg) 130 kg 129.819 kg 127.551 kg       Telemetry/ECG    5 beats nonsustained ventricular tachycardia, otherwise normal sinus rhythm/sinus bradycardia- Personally Reviewed   Physical Exam .   GEN: No acute distress.   Neck: No JVD Cardiac: RRR, no murmurs, rubs, or gallops.  Cath site clean dry and intact Respiratory: Clear to auscultation bilaterally. GI: Soft, nontender, non-distended  MS: No edema   Assessment & Plan .     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 Jacinto Halim -Continue with aggressive goal-directed medical therapy -Cardiac rehab when able -Dual antiplatelet therapy, aspirin 81 mg, ticagrelor (Brilinta) -Normal LV function reported per catheterization        Hyperlipidemia - Continue with atorvastatin high intensity dose 40 mg a day with LDL goal of less than 55 -Noteworthy for elevated LP(a).  As outpatient, appears that he has been on Praluent from last office check in December 2023 for added risk reduction.   Primary hypertension - Continue with losartan 50 mg a day carvedilol 6.25 mg twice a day spironolactone 25 mg a day   If echocardiogram does not get done today, can always approach as outpatient.  LVEF was reportedly normal on cardiac catheterization.   No evidence of sustained ventricular  tachycardia.   Cardiac rehab is talking with them currently.  We discussed precautions.   Discussed with nursing team.  Okay with discharge this afternoon. No CP, ambulating well. Spoke to wife, sister, mother, he is comfortable.  We will have close follow-up.   Donato Schultz, MD

## 2023-09-17 ENCOUNTER — Telehealth: Payer: Self-pay

## 2023-09-17 ENCOUNTER — Encounter (HOSPITAL_COMMUNITY): Payer: Self-pay | Admitting: Cardiology

## 2023-09-17 NOTE — Telephone Encounter (Signed)
Left voicemail to return call to office

## 2023-09-17 NOTE — Transitions of Care (Post Inpatient/ED Visit) (Signed)
09/17/2023  Name: Devin Robert Burundi Jr. MRN: [id number] DOB: [birth year]  Today's TOC FU Call Status: Today's TOC FU Call Status:: Successful TOC FU Call Completed TOC FU Call Complete Date: 09/17/23 Patient's Name and Date of Birth confirmed.  Transition Care Management Follow-up Telephone Call Date of Discharge: 09/15/23 Discharge Facility: [facility]) Type of Discharge: Inpatient Admission Primary Inpatient Discharge Diagnosis:: Acute STEMI How have you been since you were released from the hospital?: Better Any questions or concerns?: No (Previous MI 2020)  Items Reviewed: Did you receive and understand the discharge instructions provided?: Yes Medications obtained,verified, and reconciled?: Yes (Medications Reviewed) Any new allergies since your discharge?: No Dietary orders reviewed?: Yes Type of Diet Ordered:: Reg Heart Healthy Do you have support at home?: Yes People in Home: spouse Name of Support/Comfort Primary Source: Heather  Medications Reviewed Today: Medications Reviewed Today     Reviewed by Johnnette Barrios, RN (Registered Nurse) on 09/17/23 at 1546  Med List Status: <None>   Medication Order Taking? Sig Documenting Provider Last Dose Status Informant  aspirin 81 MG chewable tablet [id number] Yes Chew 1 tablet (81 mg total) by mouth daily. Arty Baumgartner, NP Taking Active   atorvastatin (LIPITOR) 40 MG tablet [id number] Yes Take 1 tablet (40 mg total) by mouth daily. Lyn Records, MD Taking Active Spouse/Significant Other, Self  carvedilol (COREG) 6.25 MG tablet [id number] Yes Take 1 tablet (6.25 mg total) by mouth 2 (two) times daily with a meal. Gaston Islam., NP Taking Active Spouse/Significant Other, Self  losartan (COZAAR) 50 MG tablet [id number] Yes Take 1 tablet (50 mg total) by mouth daily. Gaston Islam., NP Taking Active Spouse/Significant Other, Self  nitroGLYCERIN (NITROSTAT) 0.4 MG SL tablet [id number] Yes Place 1 tablet (0.4 mg  total) under the tongue every 5 (five) minutes as needed for chest pain. Gaston Islam., NP Taking Active Spouse/Significant Other, Self  PRALUENT 150 MG/ML Ivory Broad [id number] Yes Inject 1 pen under the skin every 14 days. Lyn Records, MD Taking Active Spouse/Significant Other, Self  sildenafil (REVATIO) 20 MG tablet [id number] No Take 40-100 mg by mouth daily as needed.  Patient not taking: Reported on 09/17/2023   [provider] Not Taking Active Spouse/Significant Other, Self           Med Note Neil Crouch, SEBASTIAN   Fri Sep 14, 2023  9:22 PM) Active order, no recent use  spironolactone (ALDACTONE) 25 MG tablet [id number] Yes Take 0.5 tablets (12.5 mg total) by mouth daily. Gaston Islam., NP Taking Active Spouse/Significant Other, Self  ticagrelor (BRILINTA) 90 MG TABS tablet [id number] Yes Take 1 tablet (90 mg total) by mouth 2 (two) times daily. Arty Baumgartner, NP Taking Active             Home Care and Equipment/Supplies: Were Home Health Services Ordered?: No Any new equipment or medical supplies ordered?: No  Functional Questionnaire: Do you need assistance with bathing/showering or dressing?: No Do you need assistance with meal preparation?: No Do you need assistance with eating?: No Do you have difficulty maintaining continence: No Do you need assistance with getting out of bed/getting out of a chair/moving?: No Do you have difficulty managing or taking your medications?: No  Follow up appointments reviewed: PCP Follow-up appointment confirmed?: NA (PCP was notified and deferred to Cardiology) Specialist Hospital Follow-up appointment confirmed?: Yes Date of Specialist follow-up appointment?: 09/27/23 Follow-Up Specialty Provider:: Cardiology Do you need transportation to your follow-up  appointment?: No (He drives.) Do you understand care options if your condition(s) worsen?: Yes-patient verbalized understanding  SDOH Interventions Today    Flowsheet  Row Most Recent Value  SDOH Interventions   Food Insecurity Interventions Intervention Not Indicated  Housing Interventions Intervention Not Indicated  Transportation Interventions Intervention Not Indicated  Utilities Interventions Intervention Not Indicated        Discussed [facility]  TOC program and weekly calls to patient to assess condition/status, medication management  and provide support/education as indicated . Patient/ Caregiver voiced understanding and declined enrollment in the 30-day TOC Program.   Patient stated he had MI 2020, he is aware of interventions and follow-up needed He plans on Cardiac Rehab being started next month. He feels great He has had no Chest pain, or SOB. 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    The patient has been provided with contact information for the care management team and has been advised to call with any health related questions or concerns.   Susa Loffler , BSN, RN Care Management Coordinator [facility] [email or web address] Direct Dial: [phone]

## 2023-09-19 NOTE — Telephone Encounter (Signed)
Patient contacted regarding discharge from [facility] on 09/15/23.  Patient understands to follow up with provider Jari Favre, PA on Sep 27, 2023 at 2:45 pm at [facility] [address].  Patient understands discharge instructions? Yes Patient understands medications and regiment? Yes Patient understands to bring all medications to this visit? Yes  Ask patient:  Are you enrolled in My Chart Yes.

## 2023-09-27 ENCOUNTER — Encounter: Payer: Self-pay | Admitting: Physician Assistant

## 2023-09-27 ENCOUNTER — Ambulatory Visit: Payer: BC Managed Care – PPO | Attending: Physician Assistant | Admitting: Physician Assistant

## 2023-09-27 VITALS — BP 98/60 | HR 73 | Ht 74.0 in | Wt 283.0 lb

## 2023-09-27 DIAGNOSIS — I5042 Chronic combined systolic (congestive) and diastolic (congestive) heart failure: Secondary | ICD-10-CM

## 2023-09-27 DIAGNOSIS — I2119 ST elevation (STEMI) myocardial infarction involving other coronary artery of inferior wall: Secondary | ICD-10-CM | POA: Diagnosis not present

## 2023-09-27 DIAGNOSIS — I2510 Atherosclerotic heart disease of native coronary artery without angina pectoris: Secondary | ICD-10-CM

## 2023-09-27 DIAGNOSIS — E785 Hyperlipidemia, unspecified: Secondary | ICD-10-CM | POA: Diagnosis not present

## 2023-09-27 DIAGNOSIS — I10 Essential (primary) hypertension: Secondary | ICD-10-CM

## 2023-09-27 MED ORDER — SPIRONOLACTONE 25 MG PO TABS
12.5000 mg | ORAL_TABLET | Freq: Every day | ORAL | 3 refills | Status: DC
Start: 2023-09-27 — End: 2024-06-09

## 2023-09-27 MED ORDER — LOSARTAN POTASSIUM 50 MG PO TABS: 50.0000 mg | ORAL_TABLET | Freq: Every day | ORAL | 3 refills | Status: DC

## 2023-09-27 MED ORDER — CARVEDILOL 6.25 MG PO TABS: 6.2500 mg | ORAL_TABLET | Freq: Two times a day (BID) | ORAL | 3 refills | Status: AC

## 2023-09-27 MED ORDER — ATORVASTATIN CALCIUM 40 MG PO TABS
40.0000 mg | ORAL_TABLET | Freq: Every day | ORAL | 3 refills | Status: DC
Start: 2023-09-27 — End: 2024-06-09

## 2023-09-27 MED ORDER — TICAGRELOR 90 MG PO TABS: 90.0000 mg | ORAL_TABLET | Freq: Two times a day (BID) | ORAL | 3 refills | Status: DC

## 2023-09-27 NOTE — Patient Instructions (Signed)
Medication Instructions:  Your physician recommends that you continue on your current medications as directed. Please refer to the Current Medication list given to you today. *If you need a refill on your cardiac medications before your next appointment, please call your pharmacy*   Lab Work: None Ordered   Testing/Procedures: None ordered   Follow-Up: At [facility], you and your health needs are our priority.  As part of our continuing mission to provide you with exceptional heart care, we have created designated Provider Care Teams.  These Care Teams include your primary Cardiologist (physician) and Advanced Practice Providers (APPs -  Physician Assistants and Nurse Practitioners) who all work together to provide you with the care you need, when you need it.  We recommend signing up for the patient portal called "MyChart".  Sign up information is provided on this After Visit Summary.  MyChart is used to connect with patients for Virtual Visits (Telemedicine).  Patients are able to view lab/test results, encounter notes, upcoming appointments, etc.  Non-urgent messages can be sent to your provider as well.   To learn more about what you can do with MyChart, go to [email or web address].    Your next appointment:   3 month(s)  Provider:   Yates Decamp, MD     Other Instructions YOU ARE OK TO PROCEED WITH CARDIAC REHAB

## 2023-09-27 NOTE — Progress Notes (Signed)
Cardiology Office Note:  .   Date:  09/27/2023  ID:  Devin Robert Burundi Jr., DOB 22-Oct-1971, MRN [id number] PCP: Merri Brunette, MD  [facility] Providers Cardiologist:  Yates Decamp, MD {    History of Present Illness: .   Devin Lunde Burundi Jr. is a 52 y.o. male with a past medical history of hypercholesteremia, hypertension, elevated LP(a), anterior wall STEMI on 12/21/2018 status post angioplasty and stenting of mid LAD presenting to the ER recently with chest pain around 5 PM that day.  Presented to the ER on 09/15/2023 with chest pain described as chest heaviness in the lower of his chest associated with marked diaphoresis.  Patient had similar experience in 2020 and has felt it was related to coronary disease and activated EMS.  He was given sublingual nitroglycerin and route.  EKG reviewed with ST elevation inferior laterally with reciprocal ST depression in the anterior leads.  Patient emergently taken to the Cath Lab.  During cardiac catheterization suddenly felt better with resolution of ST segment elevation.  Successful stenting of distal RCA with DES x 1, previously placed LAD stent patent, 70% proximal circumflex (small vessel which is to be treated medically).  Plan for DAPT with aspirin/Brilinta x 1 year.  Today, he recently experienced a heart attack that led to an emergent catheterization and stent placement in the right coronary artery. Post-procedure, the patient was prescribed Brilinta, aspirin, and Lipitor. The patient also takes carvedilol, losartan, Praluent, and spironolactone. The patient reports no issues with these medications, but notes bruising from the Brilinta and aspirin. The patient's LDL cholesterol is impressively low at 8, likely due to the atorvastatin and Praluent. The patient's blood pressure is on the lower side, but he reports no symptoms such as lightheadedness or dizziness. The patient also mentions a pressure sensation in the abdomen, which he  attributes to dietary changes and weight loss efforts. The patient has a goal to lose significant weight and is considering cardiac rehabilitation to help establish safe exercise limits.  Reports no shortness of breath nor dyspnea on exertion. Reports no chest pain, pressure, or tightness. No edema, orthopnea, PND. Reports no palpitations.   Discussed the use of AI scribe software for clinical note transcription with the patient, who gave verbal consent to proceed.  ROS: Pertinent ROS in HPI  Studies Reviewed: .        Cath: 07-Oct-2023   Left Heart Catheterization and angioplasty Oct 07, 2023: Hemodynamic data: LV 1 and 26/26, EDP 29 mmHg.  Ao 137/88, mean 109 mmHg.  No pressure gradient across the aortic valve.   Angiographic data: LV: Normal LV systolic function.  LV function not adequately evaluated for wall motion abnormality due to  PVCs. LM: Large-caliber vessel, distal left main has a 20% stenosis.  Long vessel. LAD: Large-caliber vessel, gives origin to very small diagonal branches.  3.0 x 32 mm Synergy DES placed in the mid LAD is widely patent from 12/21/2018. RI: It is a moderate caliber vessel but a very large vessel and has a secondary branch.  Proximal segment has diffuse disease about 20 to 30%. LCx: Small vessel, has a 70% ostial and proximal stenosis, gives origin to high OM1 and continues in the AV groove distally. RCA: Very large caliber vessel, has a ostial 30% stenosis.  It gives origin to a small PDA and continues as a large PL branch with secondary branches.  There is ulcerated 80% stenosis after the origin of PDA and secondary PL branch is occluded with  thrombus, small vessel.   Intervention data: Successful stenting of the distal RCA with implantation of a 3.5 x 24 mm Synergy XD DES deployed at 13 atmospheric pressure for 60 seconds and proximal optimization with 3.5 x 12 mm [place] balloon at 20 atmospheric pressure for 60 seconds.  Stenosis reduced from 80% to 0% with TIMI-3  to TIMI-3 flow.  There was also improvement in PL branch and secondary PL branch postangioplasty.  Impression and recommendations: Patient's STEMI resolved in the middle of the cardiac catheterization with improvement in symptoms of chest pain and ST segment elevations.  However the distal RCA stenosis which was the culprit appeared hazy and high-grade and also had thrombotic embolization distally into secondary small PL branch hence I felt the lesion was significant and have to proceed with PCI.   Patient will potentially be discharged home in 24 to 48 hours depending upon his recovery.  He will need aggressive risk factor modification.  Will also look at evaluation for Lp(a) clinical trials.   Echo: 09/15/2023    IMPRESSIONS     1. Left ventricular ejection fraction, by estimation, is 60 to 65%. The  left ventricle has normal function. The left ventricle demonstrates  regional wall motion abnormalities (see scoring diagram/findings for  description). There is mild asymmetric left  ventricular hypertrophy of the inferior segment. Left ventricular  diastolic parameters are indeterminate.   2. Right ventricular systolic function is normal. The right ventricular  size is normal.   3. The mitral valve is normal in structure. Trivial mitral valve  regurgitation. No evidence of mitral stenosis.   4. The aortic valve is normal in structure. Aortic valve regurgitation is  not visualized. Aortic valve sclerosis/calcification is present, without  any evidence of aortic stenosis.   5. There is borderline dilatation of the aortic root, measuring 38 mm.   6. The inferior vena cava is normal in size with greater than 50%  respiratory variability, suggesting right atrial pressure of 3 mmHg.   FINDINGS   Left Ventricle: Left ventricular ejection fraction, by estimation, is 60  to 65%. The left ventricle has normal function. The left ventricle  demonstrates regional wall motion abnormalities. The  left ventricular  internal cavity size was normal in size.  There is mild asymmetric left ventricular hypertrophy of the inferior  segment. Left ventricular diastolic parameters are indeterminate.     LV Wall Scoring:  The apex is akinetic. The mid and distal anterior septum, apical lateral  segment, and apical inferior segment are hypokinetic. The entire anterior  wall, antero-lateral wall, inferior wall, posterior wall, basal  anteroseptal  segment, mid inferoseptal segment, and basal inferoseptal segment are  normal.   Right Ventricle: The right ventricular size is normal. No increase in  right ventricular wall thickness. Right ventricular systolic function is  normal.   Left Atrium: Left atrial size was normal in size.   Right Atrium: Right atrial size was normal in size.   Pericardium: There is no evidence of pericardial effusion. Presence of  epicardial fat layer.   Mitral Valve: The mitral valve is normal in structure. Trivial mitral  valve regurgitation. No evidence of mitral valve stenosis.   Tricuspid Valve: The tricuspid valve is normal in structure. Tricuspid  valve regurgitation is not demonstrated. No evidence of tricuspid  stenosis.   Aortic Valve: The aortic valve is normal in structure. Aortic valve  regurgitation is not visualized. Aortic valve sclerosis/calcification is  present, without any evidence of aortic stenosis. Aortic valve  peak  gradient measures 5.4 mmHg.   Pulmonic Valve: The pulmonic valve was normal in structure. Pulmonic valve  regurgitation is not visualized. No evidence of pulmonic stenosis.   Aorta: There is borderline dilatation of the aortic root, measuring 38 mm.   Venous: The inferior vena cava is normal in size with greater than 50%  respiratory variability, suggesting right atrial pressure of 3 mmHg.   IAS/Shunts: No atrial level shunt detected by color flow Doppler.      Physical Exam:   VS:  BP 98/60   Pulse 73   Ht 6\' 2"   (1.88 m)   Wt 283 lb (128.4 kg)   SpO2 97%   BMI 36.34 kg/m    Wt Readings from Last 3 Encounters:  09/27/23 283 lb (128.4 kg)  09/14/23 286 lb 9.6 oz (130 kg)  10/19/22 286 lb 3.2 oz (129.8 kg)    GEN: Well nourished, well developed in no acute distress NECK: No JVD; No carotid bruits CARDIAC: RRR, no murmurs, rubs, gallops RESPIRATORY:  Clear to auscultation without rales, wheezing or rhonchi  ABDOMEN: Soft, non-tender, non-distended EXTREMITIES:  No edema; No deformity   ASSESSMENT AND PLAN: .    Coronary Artery Disease s/p PCI Recent hospitalization with stent placement in right coronary artery. Currently on Brilinta, aspirin, and Lipitor 40mg  daily. LDL is impressively low at 8. Patient has a family history of heart disease. -Continue current medications including Brilinta, aspirin, and Lipitor 40mg  daily. -Plan for discontinuation of either Brilinta or aspirin after one year, pending cardiology recommendation.  Hypertension Blood pressure is well-controlled, albeit on the lower side. No symptoms of lightheadedness or dizziness reported. -Continue current antihypertensive medications (Carvedilol 6.25mg  twice daily, Losartan 50mg  daily). -If symptoms of low blood pressure develop, consider reducing Losartan to 25mg  daily.  Weight Management Patient expresses desire to lose weight and is making dietary changes. -Encourage continued efforts in diet modification and exercise. -Refer to cardiac rehabilitation for exercise guidance and potential diet planning.  Aortic Root Dilation Mild dilation noted on recent echocardiogram. No valvular issues. -Monitor with routine follow-up. -Continue current antihypertensive regimen to prevent progression.  Follow-up -Return visit in 3 months with potential medication adjustments depending on weight loss progress. -Check lipid panel in 1 year given current excellent control. -Continue cardiac rehabilitation referral.    Cardiac  Rehabilitation Eligibility Assessment  The patient is ready to start cardiac rehabilitation from a cardiac standpoint.      Signed, Sharlene Dory, PA-C

## 2023-09-28 ENCOUNTER — Encounter (HOSPITAL_COMMUNITY): Payer: Self-pay

## 2023-09-28 ENCOUNTER — Telehealth (HOSPITAL_COMMUNITY): Payer: Self-pay

## 2023-09-28 NOTE — Telephone Encounter (Signed)
Called patient to see if he was interested in participating in the cardiac Rehab Program. Patient stated yes. Patient will come in for orientation on 12/10@830  and will attend the 645 exercise class.  Pensions consultant.

## 2023-09-28 NOTE — Telephone Encounter (Signed)
Pt insurance is active and benefits verified through [employer]. Co-pay $0.00, DED $3,200.00/$1,224.80 met, out of pocket $4,000.00/$1,757.97 met, co-insurance 20%. No pre-authorization required. Kevin/[employer], 09/28/23 @ 1:32PM, [id number]   How many CR sessions are covered? (36 visits for TCR, 72 visits for ICR)72 Is this a lifetime maximum or an annual maximum? Anuual Has the member used any of these services to date? No Is there a time limit (weeks/months) on start of program and/or program completion? No

## 2023-09-30 ENCOUNTER — Other Ambulatory Visit: Payer: Self-pay | Admitting: Physician Assistant

## 2023-10-02 ENCOUNTER — Encounter (HOSPITAL_COMMUNITY)
Admission: RE | Admit: 2023-10-02 | Discharge: 2023-10-02 | Disposition: A | Discharge status: Home / Self Care | Payer: BC Managed Care – PPO | Source: Ambulatory Visit | Attending: Cardiology | Admitting: Cardiology

## 2023-10-02 ENCOUNTER — Telehealth: Payer: Self-pay | Admitting: Cardiology

## 2023-10-02 VITALS — BP 98/70 | HR 62 | Ht 73.75 in | Wt 280.9 lb

## 2023-10-02 DIAGNOSIS — Z955 Presence of coronary angioplasty implant and graft: Secondary | ICD-10-CM | POA: Diagnosis not present

## 2023-10-02 DIAGNOSIS — I252 Old myocardial infarction: Secondary | ICD-10-CM | POA: Diagnosis not present

## 2023-10-02 DIAGNOSIS — Z48812 Encounter for surgical aftercare following surgery on the circulatory system: Secondary | ICD-10-CM | POA: Diagnosis not present

## 2023-10-02 DIAGNOSIS — I2111 ST elevation (STEMI) myocardial infarction involving right coronary artery: Secondary | ICD-10-CM

## 2023-10-02 NOTE — Progress Notes (Signed)
Cardiac Rehab Medication Review   Does the patient  feel that his/her medications are working for him/her?  yes  Has the patient been experiencing any side effects to the medications prescribed?  no  Does the patient measure his/her own blood pressure or blood glucose at home?  Checks BP occasionally, about once a month or depending on how he's feeling.  Does the patient have any problems obtaining medications due to transportation or finances?   no  Understanding of regimen: excellent Understanding of indications: excellent Potential of compliance: excellent    Comments: Patient has a good knowledge of his medications and how they're working for him.    Cristy Hilts 10/02/2023 8:38 AM

## 2023-10-02 NOTE — Progress Notes (Signed)
Cardiac Individual Treatment Plan  Patient Details  Name: Devin Robert [place] Jr. MRN: [id number] Date of Birth: Feb 28, 1971 Referring Provider:   Flowsheet Row INTENSIVE CARDIAC REHAB ORIENT from 10/02/2023 in [facility]  Referring Provider Yates Decamp, MD       Initial Encounter Date:  Flowsheet Row INTENSIVE CARDIAC REHAB ORIENT from 10/02/2023 in [facility]  Date 10/02/23       Visit Diagnosis: 09/14/23 STEMI  09/14/23 DES d RCA  Patient's Home Medications on Admission:  Current Outpatient Medications:    aspirin 81 MG chewable tablet, Chew 1 tablet (81 mg total) by mouth daily., Disp: 90 tablet, Rfl: 2   atorvastatin (LIPITOR) 40 MG tablet, Take 1 tablet (40 mg total) by mouth daily., Disp: 90 tablet, Rfl: 3   carvedilol (COREG) 6.25 MG tablet, Take 1 tablet (6.25 mg total) by mouth 2 (two) times daily with a meal., Disp: 180 tablet, Rfl: 3   losartan (COZAAR) 50 MG tablet, Take 1 tablet (50 mg total) by mouth daily., Disp: 90 tablet, Rfl: 3   nitroGLYCERIN (NITROSTAT) 0.4 MG SL tablet, Place 1 tablet (0.4 mg total) under the tongue every 5 (five) minutes as needed for chest pain., Disp: 75 tablet, Rfl: 2   PRALUENT 150 MG/ML SOAJ, Inject 1 pen under the skin every 14 days., Disp: 6 mL, Rfl: 2   spironolactone (ALDACTONE) 25 MG tablet, Take 0.5 tablets (12.5 mg total) by mouth daily., Disp: 45 tablet, Rfl: 3   ticagrelor (BRILINTA) 90 MG TABS tablet, Take 1 tablet (90 mg total) by mouth 2 (two) times daily., Disp: 180 tablet, Rfl: 3  Past Medical History: Past Medical History:  Diagnosis Date   Coronary artery disease    Family history of heart disease    HTN (hypertension)    Hyperlipidemia    Palpitations    STEMI (ST elevation myocardial infarction) (HCC)    PCI/DES to p/mLAD, 60% lcx, 40% ostial LM, and 60% PDA, EF 45%    Tobacco Use: Social  History   Tobacco Use  Smoking Status Never  Smokeless Tobacco Never    Labs: Review Flowsheet  More data exists      Latest Ref Rng & Units 06/24/2019 07/25/2019 09/15/2019 10/19/2022 09/14/2023  Labs for ITP Cardiac and Pulmonary Rehab  Cholestrol 0 - 200 mg/dL 829  562  78  130  82   LDL (calc) 0 - 99 mg/dL 66  71  21  54  8   Direct LDL 0 - 99 mg/dL - 72  - - -  HDL-C >86 mg/dL 30  32  38  36  27   Trlycerides <150 mg/dL 87  578  469  629  528   Hemoglobin A1c 4.8 - 5.6 % - - - - 5.0   TCO2 22 - 32 mmol/L - - - - 22     Details            Capillary Blood Glucose: Lab Results  Component Value Date   GLUCAP 119 (H) 09/14/2023     Exercise Target Goals: Exercise Program Goal: Individual exercise prescription set using results from initial 6 min walk test and THRR while considering  patient's activity barriers and safety.   Exercise Prescription Goal: Initial exercise prescription builds to 30-45 minutes a day of aerobic activity, 2-3 days per week.  Home exercise guidelines will be given to patient  during program as part of exercise prescription that the participant will acknowledge.  Activity Barriers & Risk Stratification:  Activity Barriers & Cardiac Risk Stratification - 10/02/23 0911       Activity Barriers & Cardiac Risk Stratification   Activity Barriers None    Cardiac Risk Stratification High             6 Minute Walk:  6 Minute Walk     Row Name 10/02/23 0941         6 Minute Walk   Phase Initial     Distance 1820 feet     Walk Time 6 minutes     # of Rest Breaks 0     MPH 3.45     METS 4.16     RPE 11     Perceived Dyspnea  0     VO2 Peak 14.56     Symptoms No     Resting HR 62 bpm     Resting BP 98/70     Resting Oxygen Saturation  97 %     Exercise Oxygen Saturation  during 6 min walk 97 %     Max Ex. HR 87 bpm     Max Ex. BP 116/76     2 Minute Post BP 104/62              Oxygen Initial Assessment:   Oxygen  Re-Evaluation:   Oxygen Discharge (Final Oxygen Re-Evaluation):   Initial Exercise Prescription:  Initial Exercise Prescription - 10/02/23 1200       Date of Initial Exercise RX and Referring Provider   Date 10/02/23    Referring Provider Yates Decamp, MD    Expected Discharge Date 12/26/23      Bike   Level 2    Minutes 15    METs 3.5      Rower   Level 1    Watts 30    Minutes 15    METs 4.3      Prescription Details   Frequency (times per week) 3    Duration Progress to 30 minutes of continuous aerobic without signs/symptoms of physical distress      Intensity   THRR 40-80% of Max Heartrate 66-136    Ratings of Perceived Exertion 11-13    Perceived Dyspnea 0-4      Progression   Progression Continue to progress workloads to maintain intensity without signs/symptoms of physical distress.      Resistance Training   Training Prescription Yes    Weight 5 lbs    Reps 10-15             Perform Capillary Blood Glucose checks as needed.  Exercise Prescription Changes:   Exercise Comments:   Exercise Goals and Review:   Exercise Goals     Row Name 10/02/23 [phone]             Exercise Goals   Increase Physical Activity Yes       Intervention Provide advice, education, support and counseling about physical activity/exercise needs.;Develop an individualized exercise prescription for aerobic and resistive training based on initial evaluation findings, risk stratification, comorbidities and participant's personal goals.       Expected Outcomes Short Term: Attend rehab on a regular basis to increase amount of physical activity.;Long Term: Add in home exercise to make exercise part of routine and to increase amount of physical activity.;Long Term: Exercising regularly at least 3-5 days a week.  Increase Strength and Stamina Yes       Intervention Provide advice, education, support and counseling about physical activity/exercise needs.;Develop an  individualized exercise prescription for aerobic and resistive training based on initial evaluation findings, risk stratification, comorbidities and participant's personal goals.       Expected Outcomes Short Term: Increase workloads from initial exercise prescription for resistance, speed, and METs.;Short Term: Perform resistance training exercises routinely during rehab and add in resistance training at home;Long Term: Improve cardiorespiratory fitness, muscular endurance and strength as measured by increased METs and functional capacity ( )       Able to understand and use rate of perceived exertion (RPE) scale Yes       Intervention Provide education and explanation on how to use RPE scale       Expected Outcomes Short Term: Able to use RPE daily in rehab to express subjective intensity level;Long Term:  Able to use RPE to guide intensity level when exercising independently       Knowledge and understanding of Target Heart Rate Range (THRR) Yes       Intervention Provide education and explanation of THRR including how the numbers were predicted and where they are located for reference       Expected Outcomes Short Term: Able to state/look up THRR;Long Term: Able to use THRR to govern intensity when exercising independently;Short Term: Able to use daily as guideline for intensity in rehab       Able to check pulse independently Yes       Intervention Provide education and demonstration on how to check pulse in carotid and radial arteries.;Review the importance of being able to check your own pulse for safety during independent exercise       Expected Outcomes Short Term: Able to explain why pulse checking is important during independent exercise;Long Term: Able to check pulse independently and accurately       Understanding of Exercise Prescription Yes       Intervention Provide education, explanation, and written materials on patient's individual exercise prescription       Expected Outcomes  Short Term: Able to explain program exercise prescription;Long Term: Able to explain home exercise prescription to exercise independently                Exercise Goals Re-Evaluation :   Discharge Exercise Prescription (Final Exercise Prescription Changes):   Nutrition:  Target Goals: Understanding of nutrition guidelines, daily intake of sodium 1500mg , cholesterol 200mg , calories 30% from fat and 7% or less from saturated fats, daily to have 5 or more servings of fruits and vegetables.  Biometrics:  Pre Biometrics - 10/02/23 0823       Pre Biometrics   Waist Circumference 47.5 inches    Hip Circumference 47.75 inches    Waist to Hip Ratio 0.99 %    Triceps Skinfold 19 mm    % Body Fat 33.9 %    Grip Strength 60 kg    Flexibility 15.13 in    Single Leg Stand 30 seconds              Nutrition Therapy Plan and Nutrition Goals:   Nutrition Assessments:  MEDIFICTS Score Key: >=70 Need to make dietary changes  40-70 Heart Healthy Diet <= 40 Therapeutic Level Cholesterol Diet    Picture Your Plate Scores: <16 Unhealthy dietary pattern with much room for improvement. 41-50 Dietary pattern unlikely to meet recommendations for good health and room for improvement. 51-60 More healthful dietary pattern, with  some room for improvement.  >60 Healthy dietary pattern, although there may be some specific behaviors that could be improved.    Nutrition Goals Re-Evaluation:   Nutrition Goals Re-Evaluation:   Nutrition Goals Discharge (Final Nutrition Goals Re-Evaluation):   Psychosocial: Target Goals: Acknowledge presence or absence of significant depression and/or stress, maximize coping skills, provide positive support system. Participant is able to verbalize types and ability to use techniques and skills needed for reducing stress and depression.  Initial Review & Psychosocial Screening:  Initial Psych Review & Screening - 10/02/23 0906       Initial Review    Current issues with None Identified      Family Dynamics   Good Support System? Yes    Comments Wife, family, friends, church members.      Barriers   Psychosocial barriers to participate in program The patient should benefit from training in stress management and relaxation.      Screening Interventions   Interventions Encouraged to exercise    Expected Outcomes Short Term goal: Identification and review with participant of any Quality of Life or Depression concerns found by scoring the questionnaire.;Long Term goal: The participant improves quality of Life and PHQ9 Scores as seen by post scores and/or verbalization of changes             Quality of Life Scores:  Quality of Life - 10/02/23 1258       Quality of Life   Select Quality of Life      Quality of Life Scores   Health/Function Pre 25.93 %    Socioeconomic Pre 25.93 %    Psych/Spiritual Pre 27.07 %    Family Pre 28.8 %    GLOBAL Pre 26.59 %            Scores of 19 and below usually indicate a poorer quality of life in these areas.  A difference of  2-3 points is a clinically meaningful difference.  A difference of 2-3 points in the total score of the Quality of Life Index has been associated with significant improvement in overall quality of life, self-image, physical symptoms, and general health in studies assessing change in quality of life.  PHQ-9: Review Flowsheet       10/02/2023  Depression screen PHQ 2/9  Decreased Interest 1  Down, Depressed, Hopeless 0  PHQ - 2 Score 1  Altered sleeping 0  Tired, decreased energy 0  Change in appetite 0  Feeling bad or failure about yourself  1  Trouble concentrating 0  Moving slowly or fidgety/restless 0  Suicidal thoughts 0  PHQ-9 Score 2  Difficult doing work/chores Not difficult at all    Details           Interpretation of Total Score  Total Score Depression Severity:  1-4 = Minimal depression, 5-9 = Mild depression, 10-14 = Moderate  depression, 15-19 = Moderately severe depression, 20-27 = Severe depression   Psychosocial Evaluation and Intervention:   Psychosocial Re-Evaluation:   Psychosocial Discharge (Final Psychosocial Re-Evaluation):   Vocational Rehabilitation: Provide vocational rehab assistance to qualifying candidates.   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 No VR needs at this time.  Education: Education Goals: Education classes will be provided on a weekly basis, covering required topics. Participant will state understanding/return demonstration of topics presented.     Core Videos: Exercise    Move It!  Clinical staff conducted group or individual video education with verbal and written material and guidebook.  Patient learns the recommended Pritikin exercise program. Exercise with the goal of living a long, healthy life. Some of the health benefits of exercise include controlled diabetes, healthier blood pressure levels, improved cholesterol levels, improved heart and lung capacity, improved sleep, and better body composition. Everyone should speak with their doctor before starting or changing an exercise routine.  Biomechanical Limitations Clinical staff conducted group or individual video education with verbal and written material and guidebook.  Patient learns how biomechanical limitations can impact exercise and how we can mitigate and possibly overcome limitations to have an impactful and balanced exercise routine.  Body Composition Clinical staff conducted group or individual video education with verbal and written material and guidebook.  Patient learns that body composition (ratio of muscle mass to fat mass) is  a key component to assessing overall fitness, rather than body weight alone. Increased fat mass, especially visceral belly fat, can put [place] at increased risk for metabolic syndrome, type 2 diabetes, heart disease, and even death. It is recommended to combine diet and exercise (cardiovascular and resistance training) to improve your body composition. Seek guidance from your physician and exercise physiologist before implementing an exercise routine.  Exercise Action Plan Clinical staff conducted group or individual video education with verbal and written material and guidebook.  Patient learns the recommended strategies to achieve and enjoy long-term exercise adherence, including variety, self-motivation, self-efficacy, and positive decision making. Benefits of exercise include fitness, good health, weight management, more energy, better sleep, less stress, and overall well-being.  Medical   Heart Disease Risk Reduction Clinical staff conducted group or individual video education with verbal and written material and guidebook.  Patient learns our heart is our most vital organ as it circulates oxygen, nutrients, white blood cells, and hormones throughout the entire body, and carries waste away. Data supports a plant-based eating plan like the Pritikin Program for its effectiveness in slowing progression of and reversing heart disease. The video provides a number of recommendations to address heart disease.   Metabolic Syndrome and Belly Fat  Clinical staff conducted group or individual video education with verbal and written material and guidebook.  Patient learns what metabolic syndrome is, how it leads to heart disease, and how one can reverse it and keep it from coming back. You have metabolic syndrome if you have 3 of the following 5 criteria: abdominal obesity, high blood pressure, high triglycerides, low HDL cholesterol, and high blood sugar.  Hypertension and Heart Disease Clinical staff  conducted group or individual video education with verbal and written material and guidebook.  Patient learns that high blood pressure, or hypertension, is very common in the [place]. Hypertension is largely due to excessive salt intake, but other important risk factors include being overweight, physical inactivity, drinking too much alcohol, smoking, and not eating enough potassium from fruits and vegetables. High blood pressure is a leading risk factor for heart attack, stroke, congestive heart failure, dementia, kidney failure, and premature death. Long-term effects of excessive salt intake include stiffening of the arteries and thickening of heart muscle and organ damage. Recommendations include ways to reduce hypertension and the risk of heart disease.  Diseases of Our Time - Focusing on Diabetes Clinical  staff conducted group or individual video education with verbal and written material and guidebook.  Patient learns why the best way to stop diseases of our time is prevention, through food and other lifestyle changes. Medicine (such as prescription pills and surgeries) is often only a Band-Aid on the problem, not a long-term solution. Most common diseases of our time include obesity, type 2 diabetes, hypertension, heart disease, and cancer. The Pritikin Program is recommended and has been proven to help reduce, reverse, and/or prevent the damaging effects of metabolic syndrome.  Nutrition   Overview of the Pritikin Eating Plan  Clinical staff conducted group or individual video education with verbal and written material and guidebook.  Patient learns about the Pritikin Eating Plan for disease risk reduction. The Pritikin Eating Plan emphasizes a wide variety of unrefined, minimally-processed carbohydrates, like fruits, vegetables, whole grains, and legumes. Go, Caution, and Stop food choices are explained. Plant-based and lean animal proteins are emphasized. Rationale provided for low sodium  intake for blood pressure control, low added sugars for blood sugar stabilization, and low added fats and oils for coronary artery disease risk reduction and weight management.  Calorie Density  Clinical staff conducted group or individual video education with verbal and written material and guidebook.  Patient learns about calorie density and how it impacts the Pritikin Eating Plan. Knowing the characteristics of the food you choose will help you decide whether those foods will lead to weight gain or weight loss, and whether you want to consume more or less of them. Weight loss is usually a side effect of the Pritikin Eating Plan because of its focus on low calorie-dense foods.  Label Reading  Clinical staff conducted group or individual video education with verbal and written material and guidebook.  Patient learns about the Pritikin recommended label reading guidelines and corresponding recommendations regarding calorie density, added sugars, sodium content, and whole grains.  Dining Out - Part 1  Clinical staff conducted group or individual video education with verbal and written material and guidebook.  Patient learns that restaurant meals can be sabotaging because they can be so high in calories, fat, sodium, and/or sugar. Patient learns recommended strategies on how to positively address this and avoid unhealthy pitfalls.  Facts on Fats  Clinical staff conducted group or individual video education with verbal and written material and guidebook.  Patient learns that lifestyle modifications can be just as effective, if not more so, as many medications for lowering your risk of heart disease. A Pritikin lifestyle can help to reduce your risk of inflammation and atherosclerosis (cholesterol build-up, or plaque, in the artery walls). Lifestyle interventions such as dietary choices and physical activity address the cause of atherosclerosis. A review of the types of fats and their impact on blood  cholesterol levels, along with dietary recommendations to reduce fat intake is also included.  Nutrition Action Plan  Clinical staff conducted group or individual video education with verbal and written material and guidebook.  Patient learns how to incorporate Pritikin recommendations into their lifestyle. Recommendations include planning and keeping personal health goals in mind as an important part of their success.  Healthy Mind-Set    Healthy Minds, Bodies, Hearts  Clinical staff conducted group or individual video education with verbal and written material and guidebook.  Patient learns how to identify when they are stressed. Video will discuss the impact of that stress, as well as the many benefits of stress management. Patient will also be introduced to stress management techniques. The way  we think, act, and feel has an impact on our hearts.  How Our Thoughts Can Heal Our Hearts  Clinical staff conducted group or individual video education with verbal and written material and guidebook.  Patient learns that negative thoughts can cause depression and anxiety. This can result in negative lifestyle behavior and serious health problems. Cognitive behavioral therapy is an effective method to help control our thoughts in order to change and improve our emotional outlook.  Additional Videos:  Exercise    Improving Performance  Clinical staff conducted group or individual video education with verbal and written material and guidebook.  Patient learns to use a non-linear approach by alternating intensity levels and lengths of time spent exercising to help burn more calories and lose more body fat. Cardiovascular exercise helps improve heart health, metabolism, hormonal balance, blood sugar control, and recovery from fatigue. Resistance training improves strength, endurance, balance, coordination, reaction time, metabolism, and muscle mass. Flexibility exercise improves circulation, posture, and  balance. Seek guidance from your physician and exercise physiologist before implementing an exercise routine and learn your capabilities and proper form for all exercise.  Introduction to Yoga  Clinical staff conducted group or individual video education with verbal and written material and guidebook.  Patient learns about yoga, a discipline of the coming together of mind, breath, and body. The benefits of yoga include improved flexibility, improved range of motion, better posture and core strength, increased lung function, weight loss, and positive self-image. Yoga's heart health benefits include lowered blood pressure, healthier heart rate, decreased cholesterol and triglyceride levels, improved immune function, and reduced stress. Seek guidance from your physician and exercise physiologist before implementing an exercise routine and learn your capabilities and proper form for all exercise.  Medical   Aging: Enhancing Your Quality of Life  Clinical staff conducted group or individual video education with verbal and written material and guidebook.  Patient learns key strategies and recommendations to stay in good physical health and enhance quality of life, such as prevention strategies, having an advocate, securing a Health Care Proxy and Power of Attorney, and keeping a list of medications and system for tracking them. It also discusses how to avoid risk for bone loss.  Biology of Weight Control  Clinical staff conducted group or individual video education with verbal and written material and guidebook.  Patient learns that weight gain occurs because we consume more calories than we burn (eating more, moving less). Even if your body weight is normal, you may have higher ratios of fat compared to muscle mass. Too much body fat puts you at increased risk for cardiovascular disease, heart attack, stroke, type 2 diabetes, and obesity-related cancers. In addition to exercise, following the Pritikin Eating  Plan can help reduce your risk.  Decoding Lab Results  Clinical staff conducted group or individual video education with verbal and written material and guidebook.  Patient learns that lab test reflects one measurement whose values change over time and are influenced by many factors, including medication, stress, sleep, exercise, food, hydration, pre-existing medical conditions, and more. It is recommended to use the knowledge from this video to become more involved with your lab results and evaluate your numbers to speak with your doctor.   Diseases of Our Time - Overview  Clinical staff conducted group or individual video education with verbal and written material and guidebook.  Patient learns that according to the CDC, 50% to 70% of chronic diseases (such as obesity, type 2 diabetes, elevated lipids, hypertension, and heart  disease) are avoidable through lifestyle improvements including healthier food choices, listening to satiety cues, and increased physical activity.  Sleep Disorders Clinical staff conducted group or individual video education with verbal and written material and guidebook.  Patient learns how good quality and duration of sleep are important to overall health and well-being. Patient also learns about sleep disorders and how they impact health along with recommendations to address them, including discussing with a physician.  Nutrition  Dining Out - Part 2 Clinical staff conducted group or individual video education with verbal and written material and guidebook.  Patient learns how to plan ahead and communicate in order to maximize their dining experience in a healthy and nutritious manner. Included are recommended food choices based on the type of restaurant the patient is visiting.   Fueling a Banker conducted group or individual video education with verbal and written material and guidebook.  There is a strong connection between our food choices  and our health. Diseases like obesity and type 2 diabetes are very prevalent and are in large-part due to lifestyle choices. The Pritikin Eating Plan provides plenty of food and hunger-curbing satisfaction. It is easy to follow, affordable, and helps reduce health risks.  Menu Workshop  Clinical staff conducted group or individual video education with verbal and written material and guidebook.  Patient learns that restaurant meals can sabotage health goals because they are often packed with calories, fat, sodium, and sugar. Recommendations include strategies to plan ahead and to communicate with the manager, chef, or server to help order a healthier meal.  Planning Your Eating Strategy  Clinical staff conducted group or individual video education with verbal and written material and guidebook.  Patient learns about the Pritikin Eating Plan and its benefit of reducing the risk of disease. The Pritikin Eating Plan does not focus on calories. Instead, it emphasizes high-quality, nutrient-rich foods. By knowing the characteristics of the foods, we choose, we can determine their calorie density and make informed decisions.  Targeting Your Nutrition Priorities  Clinical staff conducted group or individual video education with verbal and written material and guidebook.  Patient learns that lifestyle habits have a tremendous impact on disease risk and progression. This video provides eating and physical activity recommendations based on your personal health goals, such as reducing LDL cholesterol, losing weight, preventing or controlling type 2 diabetes, and reducing high blood pressure.  Vitamins and Minerals  Clinical staff conducted group or individual video education with verbal and written material and guidebook.  Patient learns different ways to obtain key vitamins and minerals, including through a recommended healthy diet. It is important to discuss all supplements you take with your  doctor.   Healthy Mind-Set    Smoking Cessation  Clinical staff conducted group or individual video education with verbal and written material and guidebook.  Patient learns that cigarette smoking and tobacco addiction pose a serious health risk which affects millions of people. Stopping smoking will significantly reduce the risk of heart disease, lung disease, and many forms of cancer. Recommended strategies for quitting are covered, including working with your doctor to develop a successful plan.  Culinary   Becoming a Set designer conducted group or individual video education with verbal and written material and guidebook.  Patient learns that cooking at home can be healthy, cost-effective, quick, and puts them in control. Keys to cooking healthy recipes will include looking at your recipe, assessing your equipment needs, planning ahead, making it simple,  choosing cost-effective seasonal ingredients, and limiting the use of added fats, salts, and sugars.  Cooking - Breakfast and Snacks  Clinical staff conducted group or individual video education with verbal and written material and guidebook.  Patient learns how important breakfast is to satiety and nutrition through the entire day. Recommendations include key foods to eat during breakfast to help stabilize blood sugar levels and to prevent overeating at meals later in the day. Planning ahead is also a key component.  Cooking - Educational psychologist conducted group or individual video education with verbal and written material and guidebook.  Patient learns eating strategies to improve overall health, including an approach to cook more at home. Recommendations include thinking of animal protein as a side on your plate rather than center stage and focusing instead on lower calorie dense options like vegetables, fruits, whole grains, and plant-based proteins, such as beans. Making sauces in large quantities to freeze  for later and leaving the skin on your vegetables are also recommended to maximize your experience.  Cooking - Healthy Salads and Dressing Clinical staff conducted group or individual video education with verbal and written material and guidebook.  Patient learns that vegetables, fruits, whole grains, and legumes are the foundations of the Pritikin Eating Plan. Recommendations include how to incorporate each of these in flavorful and healthy salads, and how to create homemade salad dressings. Proper handling of ingredients is also covered. Cooking - Soups and [employer] - Soups and Desserts Clinical staff conducted group or individual video education with verbal and written material and guidebook.  Patient learns that Pritikin soups and desserts make for easy, nutritious, and delicious snacks and meal components that are low in sodium, fat, sugar, and calorie density, while high in vitamins, minerals, and filling fiber. Recommendations include simple and healthy ideas for soups and desserts.   Overview     The Pritikin Solution Program Overview Clinical staff conducted group or individual video education with verbal and written material and guidebook.  Patient learns that the results of the Pritikin Program have been documented in more than 100 articles published in peer-reviewed journals, and the benefits include reducing risk factors for (and, in some cases, even reversing) high cholesterol, high blood pressure, type 2 diabetes, obesity, and more! An overview of the three key pillars of the Pritikin Program will be covered: eating well, doing regular exercise, and having a healthy mind-set.  WORKSHOPS  Exercise: Exercise Basics: Building Your Action Plan Clinical staff led group instruction and group discussion with PowerPoint presentation and patient guidebook. To enhance the learning environment the use of posters, models and videos may be added. At the conclusion of this workshop,  patients will comprehend the difference between physical activity and exercise, as well as the benefits of incorporating both, into their routine. Patients will understand the FITT (Frequency, Intensity, Time, and Type) principle and how to use it to build an exercise action plan. In addition, safety concerns and other considerations for exercise and cardiac rehab will be addressed by the presenter. The purpose of this lesson is to promote a comprehensive and effective weekly exercise routine in order to improve patients' overall level of fitness.   Managing Heart Disease: Your Path to a Healthier Heart Clinical staff led group instruction and group discussion with PowerPoint presentation and patient guidebook. To enhance the learning environment the use of posters, models and videos may be added.At the conclusion of this workshop, patients will understand the anatomy and physiology  of the heart. Additionally, they will understand how Pritikin's three pillars impact the risk factors, the progression, and the management of heart disease.  The purpose of this lesson is to provide a high-level overview of the heart, heart disease, and how the Pritikin lifestyle positively impacts risk factors.  Exercise Biomechanics Clinical staff led group instruction and group discussion with PowerPoint presentation and patient guidebook. To enhance the learning environment the use of posters, models and videos may be added. Patients will learn how the structural parts of their bodies function and how these functions impact their daily activities, movement, and exercise. Patients will learn how to promote a neutral spine, learn how to manage pain, and identify ways to improve their physical movement in order to promote healthy living. The purpose of this lesson is to expose patients to common physical limitations that impact physical activity. Participants will learn practical ways to adapt and manage aches and  pains, and to minimize their effect on regular exercise. Patients will learn how to maintain good posture while sitting, walking, and lifting.  Balance Training and Fall Prevention  Clinical staff led group instruction and group discussion with PowerPoint presentation and patient guidebook. To enhance the learning environment the use of posters, models and videos may be added. At the conclusion of this workshop, patients will understand the importance of their sensorimotor skills (vision, proprioception, and the vestibular system) in maintaining their ability to balance as they age. Patients will apply a variety of balancing exercises that are appropriate for their current level of function. Patients will understand the common causes for poor balance, possible solutions to these problems, and ways to modify their physical environment in order to minimize their fall risk. The purpose of this lesson is to teach patients about the importance of maintaining balance as they age and ways to minimize their risk of falling.  WORKSHOPS   Nutrition:  Fueling a Ship broker led group instruction and group discussion with PowerPoint presentation and patient guidebook. To enhance the learning environment the use of posters, models and videos may be added. Patients will review the foundational principles of the Pritikin Eating Plan and understand what constitutes a serving size in each of the food groups. Patients will also learn Pritikin-friendly foods that are better choices when away from home and review make-ahead meal and snack options. Calorie density will be reviewed and applied to three nutrition priorities: weight maintenance, weight loss, and weight gain. The purpose of this lesson is to reinforce (in a group setting) the key concepts around what patients are recommended to eat and how to apply these guidelines when away from home by planning and selecting Pritikin-friendly options.  Patients will understand how calorie density may be adjusted for different weight management goals.  Mindful Eating  Clinical staff led group instruction and group discussion with PowerPoint presentation and patient guidebook. To enhance the learning environment the use of posters, models and videos may be added. Patients will briefly review the concepts of the Pritikin Eating Plan and the importance of low-calorie dense foods. The concept of mindful eating will be introduced as well as the importance of paying attention to internal hunger signals. Triggers for non-hunger eating and techniques for dealing with triggers will be explored. The purpose of this lesson is to provide patients with the opportunity to review the basic principles of the Pritikin Eating Plan, discuss the value of eating mindfully and how to measure internal cues of hunger and fullness using the Hunger Scale.  Patients will also discuss reasons for non-hunger eating and learn strategies to use for controlling emotional eating.  Targeting Your Nutrition Priorities Clinical staff led group instruction and group discussion with PowerPoint presentation and patient guidebook. To enhance the learning environment the use of posters, models and videos may be added. Patients will learn how to determine their genetic susceptibility to disease by reviewing their family history. Patients will gain insight into the importance of diet as part of an overall healthy lifestyle in mitigating the impact of genetics and other environmental insults. The purpose of this lesson is to provide patients with the opportunity to assess their personal nutrition priorities by looking at their family history, their own health history and current risk factors. Patients will also be able to discuss ways of prioritizing and modifying the Pritikin Eating Plan for their highest risk areas  Menu  Clinical staff led group instruction and group discussion with PowerPoint  presentation and patient guidebook. To enhance the learning environment the use of posters, models and videos may be added. Using menus brought in from [employer], or printed from [employer], patients will apply the Pritikin dining out guidelines that were presented in the [employer] video. Patients will also be able to practice these guidelines in a variety of provided scenarios. The purpose of this lesson is to provide patients with the opportunity to practice hands-on learning of the Pritikin Dining Out guidelines with actual menus and practice scenarios.  Label Reading Clinical staff led group instruction and group discussion with PowerPoint presentation and patient guidebook. To enhance the learning environment the use of posters, models and videos may be added. Patients will review and discuss the Pritikin label reading guidelines presented in Pritikin's Label Reading Educational series video. Using fool labels brought in from local grocery stores and markets, patients will apply the label reading guidelines and determine if the packaged food meet the Pritikin guidelines. The purpose of this lesson is to provide patients with the opportunity to review, discuss, and practice hands-on learning of the Pritikin Label Reading guidelines with actual packaged food labels. Cooking School  Pritikin's [employer] are designed to teach patients ways to prepare quick, simple, and affordable recipes at home. The importance of nutrition's role in chronic disease risk reduction is reflected in its emphasis in the overall Pritikin program. By learning how to prepare essential core Pritikin Eating Plan recipes, patients will increase control over what they eat; be able to customize the flavor of foods without the use of added salt, sugar, or fat; and improve the quality of the food they consume. By learning a set of core recipes which are easily assembled, quickly prepared, and  affordable, patients are more likely to prepare more healthy foods at home. These workshops focus on convenient breakfasts, simple entres, side dishes, and desserts which can be prepared with minimal effort and are consistent with nutrition recommendations for cardiovascular risk reduction. Cooking [employer] are taught by a Armed forces logistics/support/administrative officer (RD) who has been trained by the [employer]. The chef or RD has a clear understanding of the importance of minimizing - if not completely eliminating - added fat, sugar, and sodium in recipes. Throughout the series of Cooking School Workshop sessions, patients will learn about healthy ingredients and efficient methods of cooking to build confidence in their capability to prepare    Cooking School weekly topics:  Adding Flavor- Sodium-Free  Fast and Healthy Breakfasts  Powerhouse Plant-Based Proteins  Satisfying  Salads and Dressings  Simple Sides and Sauces  International Cuisine-Spotlight on the [employer] Zones  Delicious Desserts  Savory Soups  [employer] - Meals in a Snap  Tasty Appetizers and Snacks  Comforting Weekend Breakfasts  One-Pot Wonders   Fast Evening Meals  Landscape architect Your Pritikin Plate  WORKSHOPS   Healthy Mindset (Psychosocial):  Focused Goals, Sustainable Changes Clinical staff led group instruction and group discussion with PowerPoint presentation and patient guidebook. To enhance the learning environment the use of posters, models and videos may be added. Patients will be able to apply effective goal setting strategies to establish at least one personal goal, and then take consistent, meaningful action toward that goal. They will learn to identify common barriers to achieving personal goals and develop strategies to overcome them. Patients will also gain an understanding of how our mind-set can impact our ability to achieve goals and the importance of cultivating a positive  and growth-oriented mind-set. The purpose of this lesson is to provide patients with a deeper understanding of how to set and achieve personal goals, as well as the tools and strategies needed to overcome common obstacles which may arise along the way.  From Head to Heart: The Power of a Healthy Outlook  Clinical staff led group instruction and group discussion with PowerPoint presentation and patient guidebook. To enhance the learning environment the use of posters, models and videos may be added. Patients will be able to recognize and describe the impact of emotions and mood on physical health. They will discover the importance of self-care and explore self-care practices which may work for them. Patients will also learn how to utilize the 4 C's to cultivate a healthier outlook and better manage stress and challenges. The purpose of this lesson is to demonstrate to patients how a healthy outlook is an essential part of maintaining good health, especially as they continue their cardiac rehab journey.  Healthy Sleep for a Healthy Heart Clinical staff led group instruction and group discussion with PowerPoint presentation and patient guidebook. To enhance the learning environment the use of posters, models and videos may be added. At the conclusion of this workshop, patients will be able to demonstrate knowledge of the importance of sleep to overall health, well-being, and quality of life. They will understand the symptoms of, and treatments for, common sleep disorders. Patients will also be able to identify daytime and nighttime behaviors which impact sleep, and they will be able to apply these tools to help manage sleep-related challenges. The purpose of this lesson is to provide patients with a general overview of sleep and outline the importance of quality sleep. Patients will learn about a few of the most common sleep disorders. Patients will also be introduced to the concept of "sleep hygiene," and  discover ways to self-manage certain sleeping problems through simple daily behavior changes. Finally, the workshop will motivate patients by clarifying the links between quality sleep and their goals of heart-healthy living.   Recognizing and Reducing Stress Clinical staff led group instruction and group discussion with PowerPoint presentation and patient guidebook. To enhance the learning environment the use of posters, models and videos may be added. At the conclusion of this workshop, patients will be able to understand the types of stress reactions, differentiate between acute and chronic stress, and recognize the impact that chronic stress has on their health. They will also be able to apply different coping mechanisms, such as reframing negative self-talk. Patients will have the opportunity to  practice a variety of stress management techniques, such as deep abdominal breathing, progressive muscle relaxation, and/or guided imagery.  The purpose of this lesson is to educate patients on the role of stress in their lives and to provide healthy techniques for coping with it.  Learning Barriers/Preferences:  Learning Barriers/Preferences - 10/02/23 0908       Learning Barriers/Preferences   Learning Barriers None    Learning Preferences Skilled Demonstration;Video             Education Topics:  Knowledge Questionnaire Score:  Knowledge Questionnaire Score - 10/02/23 0914       Knowledge Questionnaire Score   Pre Score 22/24             Core Components/Risk Factors/Patient Goals at Admission:  Personal Goals and Risk Factors at Admission - 10/02/23 0839       Core Components/Risk Factors/Patient Goals on Admission    Weight Management Yes;Obesity;Weight Loss    Intervention Weight Management/Obesity: Establish reasonable short term and long term weight goals.;Obesity: Provide education and appropriate resources to help participant work on and attain dietary goals.    Expected  Outcomes Short Term: Continue to assess and modify interventions until short term weight is achieved;Long Term: Adherence to nutrition and physical activity/exercise program aimed toward attainment of established weight goal;Weight Loss: Understanding of general recommendations for a balanced deficit meal plan, which promotes 1-2 lb weight loss per week and includes a negative energy balance of [phone] kcal/d    Hypertension Yes    Intervention Provide education on lifestyle modifcations including regular physical activity/exercise, weight management, moderate sodium restriction and increased consumption of fresh fruit, vegetables, and low fat dairy, alcohol moderation, and smoking cessation.;Monitor prescription use compliance.    Expected Outcomes Short Term: Continued assessment and intervention until BP is < 140/55mm HG in hypertensive participants. < 130/81mm HG in hypertensive participants with diabetes, heart failure or chronic kidney disease.;Long Term: Maintenance of blood pressure at goal levels.    Lipids Yes    Intervention Provide education and support for participant on nutrition & aerobic/resistive exercise along with prescribed medications to achieve LDL 70mg , HDL >40mg .    Expected Outcomes Short Term: Participant states understanding of desired cholesterol values and is compliant with medications prescribed. Participant is following exercise prescription and nutrition guidelines.;Long Term: Cholesterol controlled with medications as prescribed, with individualized exercise RX and with personalized nutrition plan. Value goals: LDL < 70mg , HDL > 40 mg.    Personal Goal Other Yes    Personal Goal Be able to exercise 30-60 minutes 6 days/week. Create an active lifestyle. Learn heart healthy recipes and proper portions for maintaining healthy weight. Stay motivated to control weight and not revert to bad habits.    Intervention Cullen will attend Exercise, Nutrition, and Healthy Mind-Set  workshops to help develop a personalized exercise action plan, nutrition plan, and learn how to set and maintain SMART goals.    Expected Outcomes Leron will comply with and maintain exercise and nutrition plans.             Core Components/Risk Factors/Patient Goals Review:    Core Components/Risk Factors/Patient Goals at Discharge (Final Review):    ITP Comments:  ITP Comments     Row Name 10/02/23 0823           ITP Comments Medical Director- Dr. Armanda Magic, MD. Introduction to the Pritikin Education Program / Intensive Cardiac Rehab. Reviewed initial orientation folder with him.  Comments: Kollen attended orientation for the cardiac rehabilitation program on  10/02/2023  to perform initial intake and exercise walk test. Benzino was introduced to the [employer] education and orientation packet was reviewed. Completed 6-minute walk test, measurements, initial ITP, and exercise prescription. Vital signs stable. Telemetry-normal sinus rhythm, asymptomatic.   Service time was from 823 to 1015. Artist Pais, MS, ACSM CEP 10/02/2023 [phone]

## 2023-10-02 NOTE — Telephone Encounter (Signed)
I spoke with [place] pharmacy and let them know Clopidogrel was stopped when patient was discharged from hospital on 09/15/23 and that patient is taking Brilinta 90 mg twice daily

## 2023-10-02 NOTE — Telephone Encounter (Signed)
[employer] is calling to get clarification on a medication patient is currently taking.

## 2023-10-08 ENCOUNTER — Encounter (HOSPITAL_COMMUNITY)
Admission: RE | Admit: 2023-10-08 | Discharge: 2023-10-08 | Disposition: A | Discharge status: Home / Self Care | Payer: BC Managed Care – PPO | Source: Ambulatory Visit | Attending: Cardiology

## 2023-10-08 DIAGNOSIS — Z955 Presence of coronary angioplasty implant and graft: Secondary | ICD-10-CM

## 2023-10-08 DIAGNOSIS — I252 Old myocardial infarction: Secondary | ICD-10-CM | POA: Diagnosis not present

## 2023-10-08 DIAGNOSIS — I2111 ST elevation (STEMI) myocardial infarction involving right coronary artery: Secondary | ICD-10-CM

## 2023-10-08 DIAGNOSIS — Z48812 Encounter for surgical aftercare following surgery on the circulatory system: Secondary | ICD-10-CM | POA: Diagnosis not present

## 2023-10-08 NOTE — Progress Notes (Signed)
Daily Session Note  Patient Details  Name: Devin Robert [place] Jr. MRN: [id number] Date of Birth: 1971-06-30 Referring Provider:   Flowsheet Row INTENSIVE CARDIAC REHAB ORIENT from 10/02/2023 in [facility]  Referring Provider Yates Decamp, MD       Encounter Date: 10/08/2023  Check In:  Session Check In - 10/08/23 0700       Check-In   Supervising physician immediately available to respond to emergencies [facility] MD immediately available    Physician(s) Bernadene Person, NP    Location [facility]    Staff Present Harriett Sine, RN, MHA;Bailey Wallace Cullens, MS, Exercise Physiologist;Jetta Dan Humphreys BS, ACSM-CEP, Exercise Physiologist;Kaylee Earlene Plater, MS, ACSM-CEP, Exercise Physiologist    Virtual Visit No    Medication changes reported     No    Fall or balance concerns reported    No    Tobacco Cessation No Change    Warm-up and Cool-down Performed as group-led instruction    Resistance Training Performed Yes    VAD Patient? No    PAD/SET Patient? No      Pain Assessment   Currently in Pain? No/denies    Pain Score 0-No pain    Multiple Pain Sites No             Capillary Blood Glucose: No results found for this or any previous visit (from the past 24 hours).   Exercise Prescription Changes - 10/08/23 0844       Response to Exercise   Blood Pressure (Admit) 98/72    Blood Pressure (Exercise) 140/64    Blood Pressure (Exit) 102/68    Heart Rate (Admit) 79 bpm    Heart Rate (Exercise) 110 bpm    Heart Rate (Exit) 82 bpm    Rating of Perceived Exertion (Exercise) 11.5    Perceived Dyspnea (Exercise) 0    Symptoms 0    Comments Pt first day in the [facility] program    Duration Progress to 30 minutes of  aerobic without signs/symptoms of physical distress    Intensity THRR unchanged      Progression   Progression Continue to progress workloads to maintain intensity without signs/symptoms of physical  distress.    Average METs 4.29      Resistance Training   Training Prescription Yes    Weight 5 lbs    Reps 10-15    Time 10 Minutes      Bike   Level 2    Watts 45    Minutes 15    METs 3      Rower   Level 1    Watts 76    Minutes 15    METs 5.58             Social History   Tobacco Use  Smoking Status Never  Smokeless Tobacco Never    Goals Met:  Independence with exercise equipment Exercise tolerated well No report of concerns or symptoms today  Goals Unmet:  Not Applicable  Comments: [name]  group exercise and education today. Pt tolerated exercise without difficulty. VSS, telemetry-NSR. Reports no symptoms. Medication list reconciled on 10/02/23 with no changes to report today. Pt denies barriers to medication compliance.  PSYCHOSOCIAL ASSESSMENT:  PHQ9=2. Pt exhibits positive coping skills, hopeful outlook with supportive family. No psychosocial needs identified at this time, no psychosocial interventions necessary.  Pt oriented to exercise equipment and gym routine. Understanding verbalized.  Dr. Armanda Magic is Medical Director for Cardiac Rehab at [facility].

## 2023-10-10 ENCOUNTER — Encounter (HOSPITAL_COMMUNITY): Admission: RE | Admit: 2023-10-10 | Payer: BC Managed Care – PPO | Source: Ambulatory Visit

## 2023-10-10 ENCOUNTER — Encounter (HOSPITAL_COMMUNITY)
Admission: RE | Admit: 2023-10-10 | Discharge: 2023-10-10 | Disposition: A | Discharge status: Home / Self Care | Payer: BC Managed Care – PPO | Source: Ambulatory Visit | Attending: Cardiology

## 2023-10-10 DIAGNOSIS — Z48812 Encounter for surgical aftercare following surgery on the circulatory system: Secondary | ICD-10-CM | POA: Diagnosis not present

## 2023-10-10 DIAGNOSIS — Z955 Presence of coronary angioplasty implant and graft: Secondary | ICD-10-CM | POA: Diagnosis not present

## 2023-10-10 DIAGNOSIS — I252 Old myocardial infarction: Secondary | ICD-10-CM | POA: Diagnosis not present

## 2023-10-10 DIAGNOSIS — I2111 ST elevation (STEMI) myocardial infarction involving right coronary artery: Secondary | ICD-10-CM

## 2023-10-12 ENCOUNTER — Encounter (HOSPITAL_COMMUNITY)
Admission: RE | Admit: 2023-10-12 | Discharge: 2023-10-12 | Disposition: A | Discharge status: Home / Self Care | Payer: BC Managed Care – PPO | Source: Ambulatory Visit | Attending: Cardiology | Admitting: Cardiology

## 2023-10-12 DIAGNOSIS — Z48812 Encounter for surgical aftercare following surgery on the circulatory system: Secondary | ICD-10-CM | POA: Diagnosis not present

## 2023-10-12 DIAGNOSIS — Z955 Presence of coronary angioplasty implant and graft: Secondary | ICD-10-CM | POA: Diagnosis not present

## 2023-10-12 DIAGNOSIS — I252 Old myocardial infarction: Secondary | ICD-10-CM | POA: Diagnosis not present

## 2023-10-12 DIAGNOSIS — I2111 ST elevation (STEMI) myocardial infarction involving right coronary artery: Secondary | ICD-10-CM

## 2023-10-15 ENCOUNTER — Encounter (HOSPITAL_COMMUNITY)
Admission: RE | Admit: 2023-10-15 | Discharge: 2023-10-15 | Disposition: A | Discharge status: Home / Self Care | Payer: BC Managed Care – PPO | Source: Ambulatory Visit | Attending: Cardiology

## 2023-10-15 DIAGNOSIS — Z955 Presence of coronary angioplasty implant and graft: Secondary | ICD-10-CM

## 2023-10-15 DIAGNOSIS — Z48812 Encounter for surgical aftercare following surgery on the circulatory system: Secondary | ICD-10-CM | POA: Diagnosis not present

## 2023-10-15 DIAGNOSIS — I252 Old myocardial infarction: Secondary | ICD-10-CM | POA: Diagnosis not present

## 2023-10-15 DIAGNOSIS — I2111 ST elevation (STEMI) myocardial infarction involving right coronary artery: Secondary | ICD-10-CM

## 2023-10-19 ENCOUNTER — Encounter (HOSPITAL_COMMUNITY): Payer: BC Managed Care – PPO

## 2023-10-22 ENCOUNTER — Encounter (HOSPITAL_COMMUNITY)
Admission: RE | Admit: 2023-10-22 | Discharge: 2023-10-22 | Disposition: A | Discharge status: Home / Self Care | Payer: BC Managed Care – PPO | Source: Ambulatory Visit | Attending: Cardiology | Admitting: Cardiology

## 2023-10-22 DIAGNOSIS — Z955 Presence of coronary angioplasty implant and graft: Secondary | ICD-10-CM | POA: Diagnosis not present

## 2023-10-22 DIAGNOSIS — I2111 ST elevation (STEMI) myocardial infarction involving right coronary artery: Secondary | ICD-10-CM

## 2023-10-22 DIAGNOSIS — Z48812 Encounter for surgical aftercare following surgery on the circulatory system: Secondary | ICD-10-CM | POA: Diagnosis not present

## 2023-10-22 DIAGNOSIS — I252 Old myocardial infarction: Secondary | ICD-10-CM | POA: Diagnosis not present

## 2023-10-26 ENCOUNTER — Encounter (HOSPITAL_COMMUNITY)
Admission: RE | Admit: 2023-10-26 | Discharge: 2023-10-26 | Disposition: A | Discharge status: Home / Self Care | Payer: BC Managed Care – PPO | Source: Ambulatory Visit | Attending: Cardiology | Admitting: Cardiology

## 2023-10-26 DIAGNOSIS — Z5189 Encounter for other specified aftercare: Secondary | ICD-10-CM | POA: Diagnosis not present

## 2023-10-26 DIAGNOSIS — I252 Old myocardial infarction: Secondary | ICD-10-CM | POA: Diagnosis not present

## 2023-10-26 DIAGNOSIS — I2111 ST elevation (STEMI) myocardial infarction involving right coronary artery: Secondary | ICD-10-CM

## 2023-10-26 DIAGNOSIS — Z955 Presence of coronary angioplasty implant and graft: Secondary | ICD-10-CM | POA: Diagnosis not present

## 2023-10-29 ENCOUNTER — Encounter (HOSPITAL_COMMUNITY): Payer: BC Managed Care – PPO

## 2023-10-29 NOTE — Progress Notes (Signed)
 Cardiac Individual Treatment Plan  Patient Details  Name: Devin Robert Koob Jr. MRN: [id number] Date of Birth: 1971/10/03 Referring Provider:   Flowsheet Row INTENSIVE CARDIAC REHAB ORIENT from 10/02/2023 in [facility]  Referring Provider Ladona Heinz, MD       Initial Encounter Date:  Flowsheet Row INTENSIVE CARDIAC REHAB ORIENT from 10/02/2023 in [facility]  Date 10/02/23       Visit Diagnosis: 09/14/23 STEMI  09/14/23 DES d RCA  Patient's Home Medications on Admission:  Current Outpatient Medications:    aspirin  81 MG chewable tablet, Chew 1 tablet (81 mg total) by mouth daily., Disp: 90 tablet, Rfl: 2   atorvastatin  (LIPITOR ) 40 MG tablet, Take 1 tablet (40 mg total) by mouth daily., Disp: 90 tablet, Rfl: 3   carvedilol  (COREG ) 6.25 MG tablet, Take 1 tablet (6.25 mg total) by mouth 2 (two) times daily with a meal., Disp: 180 tablet, Rfl: 3   losartan  (COZAAR ) 50 MG tablet, Take 1 tablet (50 mg total) by mouth daily., Disp: 90 tablet, Rfl: 3   nitroGLYCERIN  (NITROSTAT ) 0.4 MG SL tablet, Place 1 tablet (0.4 mg total) under the tongue every 5 (five) minutes as needed for chest pain., Disp: 75 tablet, Rfl: 2   PRALUENT  150 MG/ML SOAJ, Inject 1 pen under the skin every 14 days., Disp: 6 mL, Rfl: 2   spironolactone  (ALDACTONE ) 25 MG tablet, Take 0.5 tablets (12.5 mg total) by mouth daily., Disp: 45 tablet, Rfl: 3   ticagrelor  (BRILINTA ) 90 MG TABS tablet, Take 1 tablet (90 mg total) by mouth 2 (two) times daily., Disp: 180 tablet, Rfl: 3  Past Medical History: Past Medical History:  Diagnosis Date   Coronary artery disease    Family history of heart disease    HTN (hypertension)    Hyperlipidemia    Palpitations    STEMI (ST elevation myocardial infarction) (HCC)    PCI/DES to p/mLAD, 60% lcx, 40% ostial LM, and 60% PDA, EF 45%    Tobacco Use: Social  History   Tobacco Use  Smoking Status Never  Smokeless Tobacco Never    Labs: Review Flowsheet  More data exists      Latest Ref Rng & Units 06/24/2019 07/25/2019 09/15/2019 10/19/2022 09/14/2023  Labs for ITP Cardiac and Pulmonary Rehab  Cholestrol 0 - 200 mg/dL 886  873  78  885  82   LDL (calc) 0 - 99 mg/dL 66  71  21  54  8   Direct LDL 0 - 99 mg/dL - 72  - - -  HDL-C >59 mg/dL 30  32  38  36  27   Trlycerides <150 mg/dL 87  874  899  863  764   Hemoglobin A1c 4.8 - 5.6 % - - - - 5.0   TCO2 22 - 32 mmol/L - - - - 22     Capillary Blood Glucose: Lab Results  Component Value Date   GLUCAP 119 (H) 09/14/2023     Exercise Target Goals: Exercise Program Goal: Individual exercise prescription set using results from initial 6 min walk test and THRR while considering  patient's activity barriers and safety.   Exercise Prescription Goal: Initial exercise prescription builds to 30-45 minutes a day of aerobic activity, 2-3 days per week.  Home exercise guidelines will be given to patient during program as part of exercise prescription that the participant will acknowledge.  Activity Barriers & Risk Stratification:  Activity Barriers & Cardiac Risk Stratification - 10/02/23 0911       Activity Barriers & Cardiac Risk Stratification   Activity Barriers None    Cardiac Risk Stratification High             6 Minute Walk:  6 Minute Walk     Row Name 10/02/23 0941         6 Minute Walk   Phase Initial     Distance 1820 feet     Walk Time 6 minutes     # of Rest Breaks 0     MPH 3.45     METS 4.16     RPE 11     Perceived Dyspnea  0     VO2 Peak 14.56     Symptoms No     Resting HR 62 bpm     Resting BP 98/70     Resting Oxygen Saturation  97 %     Exercise Oxygen Saturation  during 6 min walk 97 %     Max Ex. HR 87 bpm     Max Ex. BP 116/76     2 Minute Post BP 104/62              Oxygen Initial Assessment:   Oxygen Re-Evaluation:   Oxygen  Discharge (Final Oxygen Re-Evaluation):   Initial Exercise Prescription:  Initial Exercise Prescription - 10/02/23 1200       Date of Initial Exercise RX and Referring Provider   Date 10/02/23    Referring Provider Ladona Heinz, MD    Expected Discharge Date 12/26/23      Bike   Level 2    Minutes 15    METs 3.5      Rower   Level 1    Watts 30    Minutes 15    METs 4.3      Prescription Details   Frequency (times per week) 3    Duration Progress to 30 minutes of continuous aerobic without signs/symptoms of physical distress      Intensity   THRR 40-80% of Max Heartrate 66-136    Ratings of Perceived Exertion 11-13    Perceived Dyspnea 0-4      Progression   Progression Continue to progress workloads to maintain intensity without signs/symptoms of physical distress.      Resistance Training   Training Prescription Yes    Weight 5 lbs    Reps 10-15             Perform Capillary Blood Glucose checks as needed.  Exercise Prescription Changes:   Exercise Prescription Changes     Row Name 10/08/23 0844 10/26/23 0841           Response to Exercise   Blood Pressure (Admit) 98/72 102/70      Blood Pressure (Exercise) 140/64 126/64      Blood Pressure (Exit) 102/68 96/56      Heart Rate (Admit) 79 bpm 72 bpm      Heart Rate (Exercise) 110 bpm 110 bpm      Heart Rate (Exit) 82 bpm 84 bpm      Rating of Perceived Exertion (Exercise) 11.5 12.5      Perceived Dyspnea (Exercise) 0 0      Symptoms 0 0      Comments Pt first day in the [employer] program Reviewed MET's, goals and home ExRx      Duration Progress  to 30 minutes of  aerobic without signs/symptoms of physical distress Progress to 30 minutes of  aerobic without signs/symptoms of physical distress      Intensity THRR unchanged THRR unchanged        Progression   Progression Continue to progress workloads to maintain intensity without signs/symptoms of physical distress. Continue to progress workloads  to maintain intensity without signs/symptoms of physical distress.      Average METs 4.29 5.68        Resistance Training   Training Prescription Yes Yes      Weight 5 lbs 5 lbs      Reps 10-15 10-15      Time 10 Minutes 10 Minutes        Bike   Level 2 4      Watts 45 105      Minutes 15 15      METs 3 4.5        Rower   Level 1 3      Watts 76 126      Minutes 15 15      METs 5.58 6.86        Home Exercise Plan   Plans to continue exercise at -- Home (comment)      Frequency -- Add 2 additional days to program exercise sessions.      Initial Home Exercises Provided -- 10/26/23               Exercise Comments:   Exercise Comments     Row Name 10/08/23 0850 10/26/23 0847         Exercise Comments Pt first day in the [facility] program. Pt tolerated exercise well with an average MET level of 4.29, Pt is leanring his THRR, RPE and ExRx. Off to a great start Reviewed MET's, goals and home ExRx. Pt tolerated exercise well with an average MET level of 5.68, Pt feels good about his goals and has a plan to get on track now that the holidays are over. He may also meet with the RD to come up with a plan. Pt Will continue to exercise in his home gym: Peloton, TM, WTS, rower and situp machine, for 30-45 mins 2 days a week. Also talked over gradual wt progression               Exercise Goals and Review:   Exercise Goals     Row Name 10/02/23 [phone]             Exercise Goals   Increase Physical Activity Yes       Intervention Provide advice, education, support and counseling about physical activity/exercise needs.;Develop an individualized exercise prescription for aerobic and resistive training based on initial evaluation findings, risk stratification, comorbidities and participant's personal goals.       Expected Outcomes Short Term: Attend rehab on a regular basis to increase amount of physical activity.;Long Term: Add in home exercise to make exercise part of  routine and to increase amount of physical activity.;Long Term: Exercising regularly at least 3-5 days a week.       Increase Strength and Stamina Yes       Intervention Provide advice, education, support and counseling about physical activity/exercise needs.;Develop an individualized exercise prescription for aerobic and resistive training based on initial evaluation findings, risk stratification, comorbidities and participant's personal goals.       Expected Outcomes Short Term: Increase workloads from initial exercise prescription for resistance, speed, and METs.;Short  Term: Perform resistance training exercises routinely during rehab and add in resistance training at home;Long Term: Improve cardiorespiratory fitness, muscular endurance and strength as measured by increased METs and functional capacity ( )       Able to understand and use rate of perceived exertion (RPE) scale Yes       Intervention Provide education and explanation on how to use RPE scale       Expected Outcomes Short Term: Able to use RPE daily in rehab to express subjective intensity level;Long Term:  Able to use RPE to guide intensity level when exercising independently       Knowledge and understanding of Target Heart Rate Range (THRR) Yes       Intervention Provide education and explanation of THRR including how the numbers were predicted and where they are located for reference       Expected Outcomes Short Term: Able to state/look up THRR;Long Term: Able to use THRR to govern intensity when exercising independently;Short Term: Able to use daily as guideline for intensity in rehab       Able to check pulse independently Yes       Intervention Provide education and demonstration on how to check pulse in carotid and radial arteries.;Review the importance of being able to check your own pulse for safety during independent exercise       Expected Outcomes Short Term: Able to explain why pulse checking is important during  independent exercise;Long Term: Able to check pulse independently and accurately       Understanding of Exercise Prescription Yes       Intervention Provide education, explanation, and written materials on patient's individual exercise prescription       Expected Outcomes Short Term: Able to explain program exercise prescription;Long Term: Able to explain home exercise prescription to exercise independently                Exercise Goals Re-Evaluation :  Exercise Goals Re-Evaluation     Row Name 10/08/23 0847 10/26/23 0845           Exercise Goal Re-Evaluation   Exercise Goals Review Increase Physical Activity;Understanding of Exercise Prescription;Increase Strength and Stamina;Knowledge and understanding of Target Heart Rate Range (THRR);Able to understand and use rate of perceived exertion (RPE) scale Increase Physical Activity;Understanding of Exercise Prescription;Increase Strength and Stamina;Knowledge and understanding of Target Heart Rate Range (THRR);Able to understand and use rate of perceived exertion (RPE) scale      Comments Pt first day in the [facility] program. Pt tolerated exercise well with an average MET level of 4.29, Pt is leanring his THRR, RPE and ExRx. Off to a great start Reviewed MET's, goals and home ExRx. Pt tolerated exercise well with an average MET level of 5.68, Pt feels good about his goals and has a plan to get on track now that the holidays are over. He may also meet with the RD to come up with a plan. Pt Will continue to exercise in his home gym: Peloton, TM, WTS, rower and situp machine, for 30-45 mins 2 days a week. Also talked over gradual wt progression      Expected Outcomes Will continue to monitor pt and progress workloads as tolerated without sign or symptom Will continue to monitor pt and progress workloads as tolerated without sign or symptom               Discharge Exercise Prescription (Final Exercise Prescription Changes):  Exercise  Prescription Changes - 10/26/23 9158  Response to Exercise   Blood Pressure (Admit) 102/70    Blood Pressure (Exercise) 126/64    Blood Pressure (Exit) 96/56    Heart Rate (Admit) 72 bpm    Heart Rate (Exercise) 110 bpm    Heart Rate (Exit) 84 bpm    Rating of Perceived Exertion (Exercise) 12.5    Perceived Dyspnea (Exercise) 0    Symptoms 0    Comments Reviewed MET's, goals and home ExRx    Duration Progress to 30 minutes of  aerobic without signs/symptoms of physical distress    Intensity THRR unchanged      Progression   Progression Continue to progress workloads to maintain intensity without signs/symptoms of physical distress.    Average METs 5.68      Resistance Training   Training Prescription Yes    Weight 5 lbs    Reps 10-15    Time 10 Minutes      Bike   Level 4    Watts 105    Minutes 15    METs 4.5      Rower   Level 3    Watts 126    Minutes 15    METs 6.86      Home Exercise Plan   Plans to continue exercise at Home (comment)    Frequency Add 2 additional days to program exercise sessions.    Initial Home Exercises Provided 10/26/23             Nutrition:  Target Goals: Understanding of nutrition guidelines, daily intake of sodium 1500mg , cholesterol 200mg , calories 30% from fat and 7% or less from saturated fats, daily to have 5 or more servings of fruits and vegetables.  Biometrics:  Pre Biometrics - 10/02/23 0823       Pre Biometrics   Waist Circumference 47.5 inches    Hip Circumference 47.75 inches    Waist to Hip Ratio 0.99 %    Triceps Skinfold 19 mm    % Body Fat 33.9 %    Grip Strength 60 kg    Flexibility 15.13 in    Single Leg Stand 30 seconds              Nutrition Therapy Plan and Nutrition Goals:   Nutrition Assessments:  MEDIFICTS Score Key: >=70 Need to make dietary changes  40-70 Heart Healthy Diet <= 40 Therapeutic Level Cholesterol Diet    Picture Your Plate Scores: <59 Unhealthy dietary  pattern with much room for improvement. 41-50 Dietary pattern unlikely to meet recommendations for good health and room for improvement. 51-60 More healthful dietary pattern, with some room for improvement.  >60 Healthy dietary pattern, although there may be some specific behaviors that could be improved.    Nutrition Goals Re-Evaluation:   Nutrition Goals Re-Evaluation:   Nutrition Goals Discharge (Final Nutrition Goals Re-Evaluation):   Psychosocial: Target Goals: Acknowledge presence or absence of significant depression and/or stress, maximize coping skills, provide positive support system. Participant is able to verbalize types and ability to use techniques and skills needed for reducing stress and depression.  Initial Review & Psychosocial Screening:  Initial Psych Review & Screening - 10/02/23 0906       Initial Review   Current issues with None Identified      Family Dynamics   Good Support System? Yes    Comments Wife, family, friends, church members.      Barriers   Psychosocial barriers to participate in program The patient should benefit from training in  stress management and relaxation.      Screening Interventions   Interventions Encouraged to exercise    Expected Outcomes Short Term goal: Identification and review with participant of any Quality of Life or Depression concerns found by scoring the questionnaire.;Long Term goal: The participant improves quality of Life and PHQ9 Scores as seen by post scores and/or verbalization of changes             Quality of Life Scores:  Quality of Life - 10/02/23 1258       Quality of Life   Select Quality of Life      Quality of Life Scores   Health/Function Pre 25.93 %    Socioeconomic Pre 25.93 %    Psych/Spiritual Pre 27.07 %    Family Pre 28.8 %    GLOBAL Pre 26.59 %            Scores of 19 and below usually indicate a poorer quality of life in these areas.  A difference of  2-3 points is a clinically  meaningful difference.  A difference of 2-3 points in the total score of the Quality of Life Index has been associated with significant improvement in overall quality of life, self-image, physical symptoms, and general health in studies assessing change in quality of life.  PHQ-9: Review Flowsheet       10/02/2023  Depression screen PHQ 2/9  Decreased Interest 1  Down, Depressed, Hopeless 0  PHQ - 2 Score 1  Altered sleeping 0  Tired, decreased energy 0  Change in appetite 0  Feeling bad or failure about yourself  1  Trouble concentrating 0  Moving slowly or fidgety/restless 0  Suicidal thoughts 0  PHQ-9 Score 2  Difficult doing work/chores Not difficult at all   Interpretation of Total Score  Total Score Depression Severity:  1-4 = Minimal depression, 5-9 = Mild depression, 10-14 = Moderate depression, 15-19 = Moderately severe depression, 20-27 = Severe depression   Psychosocial Evaluation and Intervention:   Psychosocial Re-Evaluation:  Psychosocial Re-Evaluation     Row Name 10/25/23 1104             Psychosocial Re-Evaluation   Current issues with None Identified       Interventions Encouraged to attend Cardiac Rehabilitation for the exercise       Continue Psychosocial Services  No Follow up required                Psychosocial Discharge (Final Psychosocial Re-Evaluation):  Psychosocial Re-Evaluation - 10/25/23 1104       Psychosocial Re-Evaluation   Current issues with None Identified    Interventions Encouraged to attend Cardiac Rehabilitation for the exercise    Continue Psychosocial Services  No Follow up required             Vocational Rehabilitation: Provide vocational rehab assistance to qualifying candidates.   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 No VR needs at this time.             Education: Education Goals: Education classes will be provided on a weekly basis, covering required topics. Participant will state understanding/return demonstration of topics presented.    Education     [employer]  Name 10/08/23 0900     Education   Cardiac Education Topics [facility]   Select Workshops     Workshops   Educator Exercise Physiologist   Select Exercise   Exercise Workshop Exercise Basics: Building Your Action Plan   Instruction Review Code 1- Verbalizes Understanding   Class Start Time [id number]   Class Stop Time 0850   Class Time Calculation (min) 38 min    Row Name 10/10/23 1500     Education   Cardiac Education Topics [facility]   Customer Service Manager   Weekly Topic One-Pot Wonders   Instruction Review Code 1- Verbalizes Understanding   Class Start Time 1359   Class Stop Time 1436   Class Time Calculation (min) 37 min            Core Videos: Exercise    Move It!  Clinical staff conducted group or individual video education with verbal and written material and guidebook.  Patient learns the recommended [facility] exercise program. Exercise with the goal of living a long, healthy life. Some of the health benefits of exercise include controlled diabetes, healthier blood pressure levels, improved cholesterol levels, improved heart and lung capacity, improved sleep, and better body composition. Everyone should speak with their doctor before starting or changing an exercise routine.  Biomechanical Limitations Clinical staff conducted group or individual video education with verbal and written material and guidebook.  Patient learns how biomechanical limitations can impact exercise and how we can mitigate and possibly overcome limitations to have an impactful and balanced exercise routine.  Body  Composition Clinical staff conducted group or individual video education with verbal and written material and guidebook.  Patient learns that body composition (ratio of muscle mass to fat mass) is a key component to assessing overall fitness, rather than body weight alone. Increased fat mass, especially visceral belly fat, can put us  at increased risk for metabolic syndrome, type 2 diabetes, heart disease, and even death. It is recommended to combine diet and exercise (cardiovascular and resistance training) to improve your body composition. Seek guidance from your physician and exercise physiologist before implementing an exercise routine.  Exercise Action Plan Clinical staff conducted group or individual video education with verbal and written material and guidebook.  Patient learns the recommended strategies to achieve and enjoy long-term exercise adherence, including variety, self-motivation, self-efficacy, and positive decision making. Benefits of exercise include fitness, good health, weight management, more energy, better sleep, less stress, and overall well-being.  Medical   Heart Disease Risk Reduction Clinical staff conducted group or individual video education with verbal and written material and guidebook.  Patient learns our heart is our most vital organ as it circulates oxygen, nutrients, white blood cells, and hormones throughout the entire body, and carries waste away. Data supports a plant-based eating plan like the Prit[facility]in Program for its effectiveness in slowing progression of and reversing heart disease. The video provides a number of recommendations to address heart disease.   Metabolic Syndrome and Belly Fat  Clinical staff conducted group or individual video education with verbal and written material and guidebook.  Patient learns what metabolic syndrome is, how it leads to heart disease, and how one can reverse it and keep it from coming back. You have metabolic syndrome if  you have 3 of the following 5 criteria: abdominal obesity, high blood pressure, high triglycerides, low HDL cholesterol, and high blood sugar.  Hypertension and Heart Disease Clinical staff conducted group  or individual video education with verbal and written material and guidebook.  Patient learns that high blood pressure, or hypertension, is very common in the [place] . Hypertension is largely due to excessive salt intake, but other important risk factors include being overweight, physical inactivity, drinking too much alcohol, smoking, and not eating enough potassium from fruits and vegetables. High blood pressure is a leading risk factor for heart attack, stroke, congestive heart failure, dementia, kidney failure, and premature death. Long-term effects of excessive salt intake include stiffening of the arteries and thickening of heart muscle and organ damage. Recommendations include ways to reduce hypertension and the risk of heart disease.  Diseases of Our Time - Focusing on Diabetes Clinical staff conducted group or individual video education with verbal and written material and guidebook.  Patient learns why the best way to stop diseases of our time is prevention, through food and other lifestyle changes. Medicine (such as prescription pills and surgeries) is often only a Band-Aid on the problem, not a long-term solution. Most common diseases of our time include obesity, type 2 diabetes, hypertension, heart disease, and cancer. The [facility]in Program is recommended and has been proven to help reduce, reverse, and/or prevent the damaging effects of metabolic syndrome.  Nutrition   Overview of the Pritikin Eating Plan  Clinical staff conducted group or individual video education with verbal and written material and guidebook.  Patient learns about the [facility]ritikin Eating Plan for disease risk reduction. The Pritikin Eating Plan emphasizes a wide variety of unrefined, minimally-processed  carbohydrates, like fruits, vegetables, whole grains, and legumes. Go, Caution, and Stop food choices are explained. Plant-based and lean animal proteins are emphasized. Rationale provided for low sodium intake for blood pressure control, low added sugars for blood sugar stabilization, and low added fats and oils for coronary artery disease risk reduction and weight management.  Calorie Density  Clinical staff conducted group or individual video education with verbal and written material and guidebook.  Patient learns about calorie density and how it impacts the Prit[facility]in Eating Plan. Knowing the characteristics of the food you choose will help you decide whether those foods will lead to weight gain or weight loss, and whether you want to consume more or less of them. Weight loss is usually a side effect of the [facility]rit[facility]in Eating Plan because of its focus on low calorie-dense foods.  Label Reading  Clinical staff conducted group or individual video education with verbal and written material and guidebook.  Patient learns about the [facility]rit[facility]in recommended label reading guidelines and corresponding recommendations regarding calorie density, added sugars, sodium content, and whole grains.  Dining Out - Part 1  Clinical staff conducted group or individual video education with verbal and written material and guidebook.  Patient learns that restaurant meals can be sabotaging because they can be so high in calories, fat, sodium, and/or sugar. Patient learns recommended strategies on how to positively address this and avoid unhealthy pitfalls.  Facts on Fats  Clinical staff conducted group or individual video education with verbal and written material and guidebook.  Patient learns that lifestyle modifications can be just as effective, if not more so, as many medications for lowering your risk of heart disease. A Pritikin lifestyle can help to reduce your risk of inflammation and atherosclerosis (cholesterol  build-up, or plaque, in the artery walls). Lifestyle interventions such as dietary choices and physical activity address the cause of atherosclerosis. A review of the types of fats and their impact on blood cholesterol levels, along with  dietary recommendations to reduce fat intake is also included.  Nutrition Action Plan  Clinical staff conducted group or individual video education with verbal and written material and guidebook.  Patient learns how to incorporate Pritikin recommendations into their lifestyle. Recommendations include planning and keeping personal health goals in mind as an important part of their success.  Healthy Mind-Set    Healthy Minds, Bodies, Hearts  Clinical staff conducted group or individual video education with verbal and written material and guidebook.  Patient learns how to identify when they are stressed. Video will discuss the impact of that stress, as well as the many benefits of stress management. Patient will also be introduced to stress management techniques. The way we think, act, and feel has an impact on our hearts.  How Our Thoughts Can Heal Our Hearts  Clinical staff conducted group or individual video education with verbal and written material and guidebook.  Patient learns that negative thoughts can cause depression and anxiety. This can result in negative lifestyle behavior and serious health problems. Cognitive behavioral therapy is an effective method to help control our thoughts in order to change and improve our emotional outlook.  Additional Videos:  Exercise    Improving Performance  Clinical staff conducted group or individual video education with verbal and written material and guidebook.  Patient learns to use a non-linear approach by alternating intensity levels and lengths of time spent exercising to help burn more calories and lose more body fat. Cardiovascular exercise helps improve heart health, metabolism, hormonal balance, blood sugar  control, and recovery from fatigue. Resistance training improves strength, endurance, balance, coordination, reaction time, metabolism, and muscle mass. Flexibility exercise improves circulation, posture, and balance. Seek guidance from your physician and exercise physiologist before implementing an exercise routine and learn your capabilities and proper form for all exercise.  Introduction to Yoga  Clinical staff conducted group or individual video education with verbal and written material and guidebook.  Patient learns about yoga, a discipline of the coming together of mind, breath, and body. The benefits of yoga include improved flexibility, improved range of motion, better posture and core strength, increased lung function, weight loss, and positive self-image. Yoga's heart health benefits include lowered blood pressure, healthier heart rate, decreased cholesterol and triglyceride levels, improved immune function, and reduced stress. Seek guidance from your physician and exercise physiologist before implementing an exercise routine and learn your capabilities and proper form for all exercise.  Medical   Aging: Enhancing Your Quality of Life  Clinical staff conducted group or individual video education with verbal and written material and guidebook.  Patient learns key strategies and recommendations to stay in good physical health and enhance quality of life, such as prevention strategies, having an advocate, securing a Health Care Proxy and Power of Attorney, and keeping a list of medications and system for tracking them. It also discusses how to avoid risk for bone loss.  Biology of Weight Control  Clinical staff conducted group or individual video education with verbal and written material and guidebook.  Patient learns that weight gain occurs because we consume more calories than we burn (eating more, moving less). Even if your body weight is normal, you may have higher ratios of fat compared to  muscle mass. Too much body fat puts you at increased risk for cardiovascular disease, heart attack, stroke, type 2 diabetes, and obesity-related cancers. In addition to exercise, following the Pritikin Eating Plan can help reduce your risk.  Decoding Lab Results  Clinical staff  conducted group or individual video education with verbal and written material and guidebook.  Patient learns that lab test reflects one measurement whose values change over time and are influenced by many factors, including medication, stress, sleep, exercise, food, hydration, pre-existing medical conditions, and more. It is recommended to use the knowledge from this video to become more involved with your lab results and evaluate your numbers to speak with your doctor.   Diseases of Our Time - Overview  Clinical staff conducted group or individual video education with verbal and written material and guidebook.  Patient learns that according to the CDC, 50% to 70% of chronic diseases (such as obesity, type 2 diabetes, elevated lipids, hypertension, and heart disease) are avoidable through lifestyle improvements including healthier food choices, listening to satiety cues, and increased physical activity.  Sleep Disorders Clinical staff conducted group or individual video education with verbal and written material and guidebook.  Patient learns how good quality and duration of sleep are important to overall health and well-being. Patient also learns about sleep disorders and how they impact health along with recommendations to address them, including discussing with a physician.  Nutrition  Dining Out - Part 2 Clinical staff conducted group or individual video education with verbal and written material and guidebook.  Patient learns how to plan ahead and communicate in order to maximize their dining experience in a healthy and nutritious manner. Included are recommended food choices based on the type of restaurant the patient  is visiting.   Fueling a Banker conducted group or individual video education with verbal and written material and guidebook.  There is a strong connection between our food choices and our health. Diseases like obesity and type 2 diabetes are very prevalent and are in large-part due to lifestyle choices. The Pritikin Eating Plan provides plenty of food and hunger-curbing satisfaction. It is easy to follow, affordable, and helps reduce health risks.  Menu Workshop  Clinical staff conducted group or individual video education with verbal and written material and guidebook.  Patient learns that restaurant meals can sabotage health goals because they are often packed with calories, fat, sodium, and sugar. Recommendations include strategies to plan ahead and to communicate with the manager, chef, or server to help order a healthier meal.  Planning Your Eating Strategy  Clinical staff conducted group or individual video education with verbal and written material and guidebook.  Patient learns about the Pritikin Eating Plan and its benefit of reducing the risk of disease. The Pritikin Eating Plan does not focus on calories. Instead, it emphasizes high-quality, nutrient-rich foods. By knowing the characteristics of the foods, we choose, we can determine their calorie density and make informed decisions.  Targeting Your Nutrition Priorities  Clinical staff conducted group or individual video education with verbal and written material and guidebook.  Patient learns that lifestyle habits have a tremendous impact on disease risk and progression. This video provides eating and physical activity recommendations based on your personal health goals, such as reducing LDL cholesterol, losing weight, preventing or controlling type 2 diabetes, and reducing high blood pressure.  Vitamins and Minerals  Clinical staff conducted group or individual video education with verbal and written material  and guidebook.  Patient learns different ways to obtain key vitamins and minerals, including through a recommended healthy diet. It is important to discuss all supplements you take with your doctor.   Healthy Mind-Set    Smoking Cessation  Clinical staff conducted group or individual video  education with verbal and written material and guidebook.  Patient learns that cigarette smoking and tobacco addiction pose a serious health risk which affects millions of people. Stopping smoking will significantly reduce the risk of heart disease, lung disease, and many forms of cancer. Recommended strategies for quitting are covered, including working with your doctor to develop a successful plan.  Culinary   Becoming a Set Designer conducted group or individual video education with verbal and written material and guidebook.  Patient learns that cooking at home can be healthy, cost-effective, quick, and puts them in control. Keys to cooking healthy recipes will include looking at your recipe, assessing your equipment needs, planning ahead, making it simple, choosing cost-effective seasonal ingredients, and limiting the use of added fats, salts, and sugars.  Cooking - Breakfast and Snacks  Clinical staff conducted group or individual video education with verbal and written material and guidebook.  Patient learns how important breakfast is to satiety and nutrition through the entire day. Recommendations include key foods to eat during breakfast to help stabilize blood sugar levels and to prevent overeating at meals later in the day. Planning ahead is also a key component.  Cooking - Educational Psychologist conducted group or individual video education with verbal and written material and guidebook.  Patient learns eating strategies to improve overall health, including an approach to cook more at home. Recommendations include thinking of animal protein as a side on your plate rather  than center stage and focusing instead on lower calorie dense options like vegetables, fruits, whole grains, and plant-based proteins, such as beans. Making sauces in large quantities to freeze for later and leaving the skin on your vegetables are also recommended to maximize your experience.  Cooking - Healthy Salads and Dressing Clinical staff conducted group or individual video education with verbal and written material and guidebook.  Patient learns that vegetables, fruits, whole grains, and legumes are the foundations of the Pritikin Eating Plan. Recommendations include how to incorporate each of these in flavorful and healthy salads, and how to create homemade salad dressings. Proper handling of ingredients is also covered. Cooking - Soups and [employer] - Soups and Desserts Clinical staff conducted group or individual video education with verbal and written material and guidebook.  Patient learns that Pritikin soups and desserts make for easy, nutritious, and delicious snacks and meal components that are low in sodium, fat, sugar, and calorie density, while high in vitamins, minerals, and filling fiber. Recommendations include simple and healthy ideas for soups and desserts.   Overview     The Pritikin Solution Program Overview Clinical staff conducted group or individual video education with verbal and written material and guidebook.  Patient learns that the results of the Pritikin Program have been documented in more than 100 articles published in peer-reviewed journals, and the benefits include reducing risk factors for (and, in some cases, even reversing) high cholesterol, high blood pressure, type 2 diabetes, obesity, and more! An overview of the three key pillars of the Pritikin Program will be covered: eating well, doing regular exercise, and having a healthy mind-set.  WORKSHOPS  Exercise: Exercise Basics: Building Your Action Plan Clinical staff led group instruction and  group discussion with PowerPoint presentation and patient guidebook. To enhance the learning environment the use of posters, models and videos may be added. At the conclusion of this workshop, patients will comprehend the difference between physical activity and exercise, as well as the benefits of  incorporating both, into their routine. Patients will understand the FITT (Frequency, Intensity, Time, and Type) principle and how to use it to build an exercise action plan. In addition, safety concerns and other considerations for exercise and cardiac rehab will be addressed by the presenter. The purpose of this lesson is to promote a comprehensive and effective weekly exercise routine in order to improve patients' overall level of fitness.   Managing Heart Disease: Your Path to a Healthier Heart Clinical staff led group instruction and group discussion with PowerPoint presentation and patient guidebook. To enhance the learning environment the use of posters, models and videos may be added.At the conclusion of this workshop, patients will understand the anatomy and physiology of the heart. Additionally, they will understand how Pritikin's three pillars impact the risk factors, the progression, and the management of heart disease.  The purpose of this lesson is to provide a high-level overview of the heart, heart disease, and how the Pritikin lifestyle positively impacts risk factors.  Exercise Biomechanics Clinical staff led group instruction and group discussion with PowerPoint presentation and patient guidebook. To enhance the learning environment the use of posters, models and videos may be added. Patients will learn how the structural parts of their bodies function and how these functions impact their daily activities, movement, and exercise. Patients will learn how to promote a neutral spine, learn how to manage pain, and identify ways to improve their physical movement in order to  promote healthy living. The purpose of this lesson is to expose patients to common physical limitations that impact physical activity. Participants will learn practical ways to adapt and manage aches and pains, and to minimize their effect on regular exercise. Patients will learn how to maintain good posture while sitting, walking, and lifting.  Balance Training and Fall Prevention  Clinical staff led group instruction and group discussion with PowerPoint presentation and patient guidebook. To enhance the learning environment the use of posters, models and videos may be added. At the conclusion of this workshop, patients will understand the importance of their sensorimotor skills (vision, proprioception, and the vestibular system) in maintaining their ability to balance as they age. Patients will apply a variety of balancing exercises that are appropriate for their current level of function. Patients will understand the common causes for poor balance, possible solutions to these problems, and ways to modify their physical environment in order to minimize their fall risk. The purpose of this lesson is to teach patients about the importance of maintaining balance as they age and ways to minimize their risk of falling.  WORKSHOPS   Nutrition:  Fueling a Ship Broker led group instruction and group discussion with PowerPoint presentation and patient guidebook. To enhance the learning environment the use of posters, models and videos may be added. Patients will review the foundational principles of the Pritikin Eating Plan and understand what constitutes a serving size in each of the food groups. Patients will also learn Pritikin-friendly foods that are better choices when away from home and review make-ahead meal and snack options. Calorie density will be reviewed and applied to three nutrition priorities: weight maintenance, weight loss, and weight gain. The purpose of this lesson is to  reinforce (in a group setting) the key concepts around what patients are recommended to eat and how to apply these guidelines when away from home by planning and selecting Pritikin-friendly options. Patients will understand how calorie density may be adjusted for different weight management goals.  Mindful Eating  Clinical  staff led group instruction and group discussion with PowerPoint presentation and patient guidebook. To enhance the learning environment the use of posters, models and videos may be added. Patients will briefly review the concepts of the Pritikin Eating Plan and the importance of low-calorie dense foods. The concept of mindful eating will be introduced as well as the importance of paying attention to internal hunger signals. Triggers for non-hunger eating and techniques for dealing with triggers will be explored. The purpose of this lesson is to provide patients with the opportunity to review the basic principles of the Pritikin Eating Plan, discuss the value of eating mindfully and how to measure internal cues of hunger and fullness using the Hunger Scale. Patients will also discuss reasons for non-hunger eating and learn strategies to use for controlling emotional eating.  Targeting Your Nutrition Priorities Clinical staff led group instruction and group discussion with PowerPoint presentation and patient guidebook. To enhance the learning environment the use of posters, models and videos may be added. Patients will learn how to determine their genetic susceptibility to disease by reviewing their family history. Patients will gain insight into the importance of diet as part of an overall healthy lifestyle in mitigating the impact of genetics and other environmental insults. The purpose of this lesson is to provide patients with the opportunity to assess their personal nutrition priorities by looking at their family history, their own health history and current risk factors. Patients will  also be able to discuss ways of prioritizing and modifying the Pritikin Eating Plan for their highest risk areas  Menu  Clinical staff led group instruction and group discussion with PowerPoint presentation and patient guidebook. To enhance the learning environment the use of posters, models and videos may be added. Using menus brought in from [employer], or printed from [employer], patients will apply the Pritikin dining out guidelines that were presented in the [employer] video. Patients will also be able to practice these guidelines in a variety of provided scenarios. The purpose of this lesson is to provide patients with the opportunity to practice hands-on learning of the Pritikin Dining Out guidelines with actual menus and practice scenarios.  Label Reading Clinical staff led group instruction and group discussion with PowerPoint presentation and patient guidebook. To enhance the learning environment the use of posters, models and videos may be added. Patients will review and discuss the Pritikin label reading guidelines presented in Pritikin's Label Reading Educational series video. Using fool labels brought in from local grocery stores and markets, patients will apply the label reading guidelines and determine if the packaged food meet the Pritikin guidelines. The purpose of this lesson is to provide patients with the opportunity to review, discuss, and practice hands-on learning of the Pritikin Label Reading guidelines with actual packaged food labels. Cooking School  Pritikin's [employer] are designed to teach patients ways to prepare quick, simple, and affordable recipes at home. The importance of nutrition's role in chronic disease risk reduction is reflected in its emphasis in the overall Pritikin program. By learning how to prepare essential core Pritikin Eating Plan recipes, patients will increase control over what they eat; be able to customize the  flavor of foods without the use of added salt, sugar, or fat; and improve the quality of the food they consume. By learning a set of core recipes which are easily assembled, quickly prepared, and affordable, patients are more likely to prepare more healthy foods at home. These workshops focus on convenient  breakfasts, simple entres, side dishes, and desserts which can be prepared with minimal effort and are consistent with nutrition recommendations for cardiovascular risk reduction. Cooking [employer] are taught by a armed forces logistics/support/administrative officer (RD) who has been trained by the [employer]. The chef or RD has a clear understanding of the importance of minimizing - if not completely eliminating - added fat, sugar, and sodium in recipes. Throughout the series of Cooking School Workshop sessions, patients will learn about healthy ingredients and efficient methods of cooking to build confidence in their capability to prepare    Cooking School weekly topics:  Adding Flavor- Sodium-Free  Fast and Healthy Breakfasts  Powerhouse Plant-Based Proteins  Satisfying Salads and Dressings  Simple Sides and Sauces  International Cuisine-Spotlight on the [employer] Zones  Delicious Desserts  Savory Soups  [employer] - Meals in a Astronomer Appetizers and Snacks  Comforting Weekend Breakfasts  One-Pot Wonders   Fast Evening Meals  Landscape Architect Your Pritikin Plate  WORKSHOPS   Healthy Mindset (Psychosocial):  Focused Goals, Sustainable Changes Clinical staff led group instruction and group discussion with PowerPoint presentation and patient guidebook. To enhance the learning environment the use of posters, models and videos may be added. Patients will be able to apply effective goal setting strategies to establish at least one personal goal, and then take consistent, meaningful action toward that goal. They will learn to identify common barriers to achieving  personal goals and develop strategies to overcome them. Patients will also gain an understanding of how our mind-set can impact our ability to achieve goals and the importance of cultivating a positive and growth-oriented mind-set. The purpose of this lesson is to provide patients with a deeper understanding of how to set and achieve personal goals, as well as the tools and strategies needed to overcome common obstacles which may arise along the way.  From Head to Heart: The Power of a Healthy Outlook  Clinical staff led group instruction and group discussion with PowerPoint presentation and patient guidebook. To enhance the learning environment the use of posters, models and videos may be added. Patients will be able to recognize and describe the impact of emotions and mood on physical health. They will discover the importance of self-care and explore self-care practices which may work for them. Patients will also learn how to utilize the 4 C's to cultivate a healthier outlook and better manage stress and challenges. The purpose of this lesson is to demonstrate to patients how a healthy outlook is an essential part of maintaining good health, especially as they continue their cardiac rehab journey.  Healthy Sleep for a Healthy Heart Clinical staff led group instruction and group discussion with PowerPoint presentation and patient guidebook. To enhance the learning environment the use of posters, models and videos may be added. At the conclusion of this workshop, patients will be able to demonstrate knowledge of the importance of sleep to overall health, well-being, and quality of life. They will understand the symptoms of, and treatments for, common sleep disorders. Patients will also be able to identify daytime and nighttime behaviors which impact sleep, and they will be able to apply these tools to help manage sleep-related challenges. The purpose of this lesson is to provide patients with a general  overview of sleep and outline the importance of quality sleep. Patients will learn about a few of the most common sleep disorders. Patients will also be introduced to the concept of "sleep hygiene," and  discover ways to self-manage certain sleeping problems through simple daily behavior changes. Finally, the workshop will motivate patients by clarifying the links between quality sleep and their goals of heart-healthy living.   Recognizing and Reducing Stress Clinical staff led group instruction and group discussion with PowerPoint presentation and patient guidebook. To enhance the learning environment the use of posters, models and videos may be added. At the conclusion of this workshop, patients will be able to understand the types of stress reactions, differentiate between acute and chronic stress, and recognize the impact that chronic stress has on their health. They will also be able to apply different coping mechanisms, such as reframing negative self-talk. Patients will have the opportunity to practice a variety of stress management techniques, such as deep abdominal breathing, progressive muscle relaxation, and/or guided imagery.  The purpose of this lesson is to educate patients on the role of stress in their lives and to provide healthy techniques for coping with it.  Learning Barriers/Preferences:  Learning Barriers/Preferences - 10/02/23 0908       Learning Barriers/Preferences   Learning Barriers None    Learning Preferences Skilled Demonstration;Video             Education Topics:  Knowledge Questionnaire Score:  Knowledge Questionnaire Score - 10/02/23 0914       Knowledge Questionnaire Score   Pre Score 22/24             Core Components/Risk Factors/Patient Goals at Admission:  Personal Goals and Risk Factors at Admission - 10/02/23 0839       Core Components/Risk Factors/Patient Goals on Admission    Weight Management Yes;Obesity;Weight Loss    Intervention  Weight Management/Obesity: Establish reasonable short term and long term weight goals.;Obesity: Provide education and appropriate resources to help participant work on and attain dietary goals.    Expected Outcomes Short Term: Continue to assess and modify interventions until short term weight is achieved;Long Term: Adherence to nutrition and physical activity/exercise program aimed toward attainment of established weight goal;Weight Loss: Understanding of general recommendations for a balanced deficit meal plan, which promotes 1-2 lb weight loss per week and includes a negative energy balance of [phone] kcal/d    Hypertension Yes    Intervention Provide education on lifestyle modifcations including regular physical activity/exercise, weight management, moderate sodium restriction and increased consumption of fresh fruit, vegetables, and low fat dairy, alcohol moderation, and smoking cessation.;Monitor prescription use compliance.    Expected Outcomes Short Term: Continued assessment and intervention until BP is < 140/57mm HG in hypertensive participants. < 130/74mm HG in hypertensive participants with diabetes, heart failure or chronic kidney disease.;Long Term: Maintenance of blood pressure at goal levels.    Lipids Yes    Intervention Provide education and support for participant on nutrition & aerobic/resistive exercise along with prescribed medications to achieve LDL 70mg , HDL >40mg .    Expected Outcomes Short Term: Participant states understanding of desired cholesterol values and is compliant with medications prescribed. Participant is following exercise prescription and nutrition guidelines.;Long Term: Cholesterol controlled with medications as prescribed, with individualized exercise RX and with personalized nutrition plan. Value goals: LDL < 70mg , HDL > 40 mg.    Personal Goal Other Yes    Personal Goal Be able to exercise 30-60 minutes 6 days/week. Create an active lifestyle. Learn heart  healthy recipes and proper portions for maintaining healthy weight. Stay motivated to control weight and not revert to bad habits.    Intervention Anil will attend Exercise, Nutrition, and Healthy Mind-Set  workshops to help develop a personalized exercise action plan, nutrition plan, and learn how to set and maintain SMART goals.    Expected Outcomes Jemarcus will comply with and maintain exercise and nutrition plans.             Core Components/Risk Factors/Patient Goals Review:   Goals and Risk Factor Review     Row Name 10/25/23 1105             Core Components/Risk Factors/Patient Goals Review   Personal Goals Review Weight Management/Obesity;Lipids;Hypertension       Review Kempton is off to a good start to exercise at cardiac rehab. . Vital signs have been stable.       Expected Outcomes Hagop will continue to participate in cardiac rehab for exercise, nutrition and lifestyle modifications                Core Components/Risk Factors/Patient Goals at Discharge (Final Review):   Goals and Risk Factor Review - 10/25/23 1105       Core Components/Risk Factors/Patient Goals Review   Personal Goals Review Weight Management/Obesity;Lipids;Hypertension    Review Carmen is off to a good start to exercise at cardiac rehab. . Vital signs have been stable.    Expected Outcomes Cecilio will continue to participate in cardiac rehab for exercise, nutrition and lifestyle modifications             ITP Comments:  ITP Comments     Row Name 10/02/23 9176 10/25/23 1103         ITP Comments Medical Director- Dr. Wilbert Bihari, MD. Introduction to the [facility] / Intensive Cardiac Rehab. Reviewed initial orientation folder with him. 30 Day ITP Review. Tyshan has good attendance and participation in cardiac rehab. Koda is off to a good start to exercise.               Comments: See ITP comment.

## 2023-10-31 ENCOUNTER — Encounter (HOSPITAL_COMMUNITY)
Admission: RE | Admit: 2023-10-31 | Discharge: 2023-10-31 | Disposition: A | Discharge status: Home / Self Care | Payer: BC Managed Care – PPO | Source: Ambulatory Visit | Attending: Cardiology | Admitting: Cardiology

## 2023-10-31 DIAGNOSIS — Z5189 Encounter for other specified aftercare: Secondary | ICD-10-CM | POA: Diagnosis not present

## 2023-10-31 DIAGNOSIS — I252 Old myocardial infarction: Secondary | ICD-10-CM | POA: Diagnosis not present

## 2023-10-31 DIAGNOSIS — Z955 Presence of coronary angioplasty implant and graft: Secondary | ICD-10-CM

## 2023-10-31 DIAGNOSIS — I2111 ST elevation (STEMI) myocardial infarction involving right coronary artery: Secondary | ICD-10-CM

## 2023-11-02 ENCOUNTER — Encounter (HOSPITAL_COMMUNITY)
Admission: RE | Admit: 2023-11-02 | Discharge: 2023-11-02 | Disposition: A | Discharge status: Home / Self Care | Payer: BC Managed Care – PPO | Source: Ambulatory Visit | Attending: Cardiology

## 2023-11-02 DIAGNOSIS — I2111 ST elevation (STEMI) myocardial infarction involving right coronary artery: Secondary | ICD-10-CM

## 2023-11-02 DIAGNOSIS — I252 Old myocardial infarction: Secondary | ICD-10-CM | POA: Diagnosis not present

## 2023-11-02 DIAGNOSIS — Z955 Presence of coronary angioplasty implant and graft: Secondary | ICD-10-CM

## 2023-11-02 DIAGNOSIS — Z5189 Encounter for other specified aftercare: Secondary | ICD-10-CM | POA: Diagnosis not present

## 2023-11-05 ENCOUNTER — Encounter (HOSPITAL_COMMUNITY): Payer: BC Managed Care – PPO

## 2023-11-07 ENCOUNTER — Encounter (HOSPITAL_COMMUNITY): Payer: BC Managed Care – PPO

## 2023-11-09 ENCOUNTER — Encounter (HOSPITAL_COMMUNITY): Payer: BC Managed Care – PPO

## 2023-11-12 ENCOUNTER — Encounter (HOSPITAL_COMMUNITY): Payer: BC Managed Care – PPO

## 2023-11-14 ENCOUNTER — Encounter (HOSPITAL_COMMUNITY): Payer: BC Managed Care – PPO

## 2023-11-16 ENCOUNTER — Encounter (HOSPITAL_COMMUNITY): Payer: BC Managed Care – PPO

## 2023-11-19 ENCOUNTER — Encounter (HOSPITAL_COMMUNITY): Admission: RE | Admit: 2023-11-19 | Payer: BC Managed Care – PPO | Source: Ambulatory Visit

## 2023-11-21 ENCOUNTER — Telehealth (HOSPITAL_COMMUNITY): Payer: Self-pay | Admitting: *Deleted

## 2023-11-21 ENCOUNTER — Encounter (HOSPITAL_COMMUNITY): Admission: RE | Admit: 2023-11-21 | Payer: BC Managed Care – PPO | Source: Ambulatory Visit

## 2023-11-21 NOTE — Telephone Encounter (Signed)
Spoke with Devin Wright. He has been out of town with work. Has a new territory so currently a lot of travelling. He will not be able to return to cardiac rehab until Feb. 10th. He is trying to get in 30 minutes of exercise 5 days a week, typically exercising at a hotel gym. Utilizing stationary bike and treadmill or elliptical when there is not a rower available. He reports he is doing well.

## 2023-11-21 NOTE — Telephone Encounter (Signed)
Left message to call cardiac rehab.Thayer Headings RN BSN

## 2023-11-22 NOTE — Progress Notes (Signed)
Cardiac Individual Treatment Plan  Patient Details  Name: Devin Robert [place] Jr. MRN: [id number] Date of Birth: 04-Apr-1971 Referring Provider:   Flowsheet Row INTENSIVE CARDIAC REHAB ORIENT from 10/02/2023 in [facility]  Referring Provider Yates Decamp, MD       Initial Encounter Date:  Flowsheet Row INTENSIVE CARDIAC REHAB ORIENT from 10/02/2023 in [facility]  Date 10/02/23       Visit Diagnosis: 09/14/23 STEMI  09/14/23 DES d RCA  Patient's Home Medications on Admission:  Current Outpatient Medications:    aspirin 81 MG chewable tablet, Chew 1 tablet (81 mg total) by mouth daily., Disp: 90 tablet, Rfl: 2   atorvastatin (LIPITOR) 40 MG tablet, Take 1 tablet (40 mg total) by mouth daily., Disp: 90 tablet, Rfl: 3   carvedilol (COREG) 6.25 MG tablet, Take 1 tablet (6.25 mg total) by mouth 2 (two) times daily with a meal., Disp: 180 tablet, Rfl: 3   losartan (COZAAR) 50 MG tablet, Take 1 tablet (50 mg total) by mouth daily., Disp: 90 tablet, Rfl: 3   nitroGLYCERIN (NITROSTAT) 0.4 MG SL tablet, Place 1 tablet (0.4 mg total) under the tongue every 5 (five) minutes as needed for chest pain., Disp: 75 tablet, Rfl: 2   PRALUENT 150 MG/ML SOAJ, Inject 1 pen under the skin every 14 days., Disp: 6 mL, Rfl: 2   spironolactone (ALDACTONE) 25 MG tablet, Take 0.5 tablets (12.5 mg total) by mouth daily., Disp: 45 tablet, Rfl: 3   ticagrelor (BRILINTA) 90 MG TABS tablet, Take 1 tablet (90 mg total) by mouth 2 (two) times daily., Disp: 180 tablet, Rfl: 3  Past Medical History: Past Medical History:  Diagnosis Date   Coronary artery disease    Family history of heart disease    HTN (hypertension)    Hyperlipidemia    Palpitations    STEMI (ST elevation myocardial infarction) (HCC)    PCI/DES to p/mLAD, 60% lcx, 40% ostial LM, and 60% PDA, EF 45%    Tobacco Use: Social  History   Tobacco Use  Smoking Status Never  Smokeless Tobacco Never    Labs: Review Flowsheet  More data exists      Latest Ref Rng & Units 06/24/2019 07/25/2019 09/15/2019 10/19/2022 09/14/2023  Labs for ITP Cardiac and Pulmonary Rehab  Cholestrol 0 - 200 mg/dL 130  865  78  784  82   LDL (calc) 0 - 99 mg/dL 66  71  21  54  8   Direct LDL 0 - 99 mg/dL - 72  - - -  HDL-C >69 mg/dL 30  32  38  36  27   Trlycerides <150 mg/dL 87  629  528  413  244   Hemoglobin A1c 4.8 - 5.6 % - - - - 5.0   TCO2 22 - 32 mmol/L - - - - 22     Capillary Blood Glucose: Lab Results  Component Value Date   GLUCAP 119 (H) 09/14/2023     Exercise Target Goals: Exercise Program Goal: Individual exercise prescription set using results from initial 6 min walk test and THRR while considering  patient's activity barriers and safety.   Exercise Prescription Goal: Initial exercise prescription builds to 30-45 minutes a day of aerobic activity, 2-3 days per week.  Home exercise guidelines will be given to patient during program as part of exercise prescription that the participant will acknowledge.  Activity Barriers & Risk Stratification:  Activity Barriers & Cardiac Risk Stratification - 10/02/23 0911       Activity Barriers & Cardiac Risk Stratification   Activity Barriers None    Cardiac Risk Stratification High             6 Minute Walk:  6 Minute Walk     Row Name 10/02/23 0941         6 Minute Walk   Phase Initial     Distance 1820 feet     Walk Time 6 minutes     # of Rest Breaks 0     MPH 3.45     METS 4.16     RPE 11     Perceived Dyspnea  0     VO2 Peak 14.56     Symptoms No     Resting HR 62 bpm     Resting BP 98/70     Resting Oxygen Saturation  97 %     Exercise Oxygen Saturation  during 6 min walk 97 %     Max Ex. HR 87 bpm     Max Ex. BP 116/76     2 Minute Post BP 104/62              Oxygen Initial Assessment:   Oxygen Re-Evaluation:   Oxygen  Discharge (Final Oxygen Re-Evaluation):   Initial Exercise Prescription:  Initial Exercise Prescription - 10/02/23 1200       Date of Initial Exercise RX and Referring Provider   Date 10/02/23    Referring Provider Yates Decamp, MD    Expected Discharge Date 12/26/23      Bike   Level 2    Minutes 15    METs 3.5      Rower   Level 1    Watts 30    Minutes 15    METs 4.3      Prescription Details   Frequency (times per week) 3    Duration Progress to 30 minutes of continuous aerobic without signs/symptoms of physical distress      Intensity   THRR 40-80% of Max Heartrate 66-136    Ratings of Perceived Exertion 11-13    Perceived Dyspnea 0-4      Progression   Progression Continue to progress workloads to maintain intensity without signs/symptoms of physical distress.      Resistance Training   Training Prescription Yes    Weight 5 lbs    Reps 10-15             Perform Capillary Blood Glucose checks as needed.  Exercise Prescription Changes:   Exercise Prescription Changes     Row Name 10/08/23 0844 10/26/23 0841           Response to Exercise   Blood Pressure (Admit) 98/72 102/70      Blood Pressure (Exercise) 140/64 126/64      Blood Pressure (Exit) 102/68 96/56      Heart Rate (Admit) 79 bpm 72 bpm      Heart Rate (Exercise) 110 bpm 110 bpm      Heart Rate (Exit) 82 bpm 84 bpm      Rating of Perceived Exertion (Exercise) 11.5 12.5      Perceived Dyspnea (Exercise) 0 0      Symptoms 0 0      Comments Pt first day in the [employer] program Reviewed MET's, goals and home ExRx      Duration Progress  to 30 minutes of  aerobic without signs/symptoms of physical distress Progress to 30 minutes of  aerobic without signs/symptoms of physical distress      Intensity THRR unchanged THRR unchanged        Progression   Progression Continue to progress workloads to maintain intensity without signs/symptoms of physical distress. Continue to progress workloads  to maintain intensity without signs/symptoms of physical distress.      Average METs 4.29 5.68        Resistance Training   Training Prescription Yes Yes      Weight 5 lbs 5 lbs      Reps 10-15 10-15      Time 10 Minutes 10 Minutes        Bike   Level 2 4      Watts 45 105      Minutes 15 15      METs 3 4.5        Rower   Level 1 3      Watts 76 126      Minutes 15 15      METs 5.58 6.86        Home Exercise Plan   Plans to continue exercise at -- Home (comment)      Frequency -- Add 2 additional days to program exercise sessions.      Initial Home Exercises Provided -- 10/26/23               Exercise Comments:   Exercise Comments     Row Name 10/08/23 0850 10/26/23 0847 11/15/23 0738 11/22/23 1258     Exercise Comments Pt first day in the [facility] program. Pt tolerated exercise well with an average MET level of 4.29, Pt is leanring his THRR, RPE and ExRx. Off to a great start Reviewed MET's, goals and home ExRx. Pt tolerated exercise well with an average MET level of 5.68, Pt feels good about his goals and has a plan to get on track now that the holidays are over. He may also meet with the RD to come up with a plan. Pt Will continue to exercise in his home gym: Peloton, TM, WTS, rower and situp machine, for 30-45 mins 2 days a week. Also talked over gradual wt progression Patient has been absent since 11/02/23. Will review education when pateint returns to [facility] Patient has work constraints and continues to be absent since 1/10.25. Patient plans to return 12/03/23             Exercise Goals and Review:   Exercise Goals     Row Name 10/02/23 9147             Exercise Goals   Increase Physical Activity Yes       Intervention Provide advice, education, support and counseling about physical activity/exercise needs.;Develop an individualized exercise prescription for aerobic and resistive training based on initial evaluation findings, risk stratification,  comorbidities and participant's personal goals.       Expected Outcomes Short Term: Attend rehab on a regular basis to increase amount of physical activity.;Long Term: Add in home exercise to make exercise part of routine and to increase amount of physical activity.;Long Term: Exercising regularly at least 3-5 days a week.       Increase Strength and Stamina Yes       Intervention Provide advice, education, support and counseling about physical activity/exercise needs.;Develop an individualized exercise prescription for aerobic and resistive training based on initial evaluation findings,  risk stratification, comorbidities and participant's personal goals.       Expected Outcomes Short Term: Increase workloads from initial exercise prescription for resistance, speed, and METs.;Short Term: Perform resistance training exercises routinely during rehab and add in resistance training at home;Long Term: Improve cardiorespiratory fitness, muscular endurance and strength as measured by increased METs and functional capacity ( )       Able to understand and use rate of perceived exertion (RPE) scale Yes       Intervention Provide education and explanation on how to use RPE scale       Expected Outcomes Short Term: Able to use RPE daily in rehab to express subjective intensity level;Long Term:  Able to use RPE to guide intensity level when exercising independently       Knowledge and understanding of Target Heart Rate Range (THRR) Yes       Intervention Provide education and explanation of THRR including how the numbers were predicted and where they are located for reference       Expected Outcomes Short Term: Able to state/look up THRR;Long Term: Able to use THRR to govern intensity when exercising independently;Short Term: Able to use daily as guideline for intensity in rehab       Able to check pulse independently Yes       Intervention Provide education and demonstration on how to check pulse in carotid and  radial arteries.;Review the importance of being able to check your own pulse for safety during independent exercise       Expected Outcomes Short Term: Able to explain why pulse checking is important during independent exercise;Long Term: Able to check pulse independently and accurately       Understanding of Exercise Prescription Yes       Intervention Provide education, explanation, and written materials on patient's individual exercise prescription       Expected Outcomes Short Term: Able to explain program exercise prescription;Long Term: Able to explain home exercise prescription to exercise independently                Exercise Goals Re-Evaluation :  Exercise Goals Re-Evaluation     Row Name 10/08/23 0847 10/26/23 0845           Exercise Goal Re-Evaluation   Exercise Goals Review Increase Physical Activity;Understanding of Exercise Prescription;Increase Strength and Stamina;Knowledge and understanding of Target Heart Rate Range (THRR);Able to understand and use rate of perceived exertion (RPE) scale Increase Physical Activity;Understanding of Exercise Prescription;Increase Strength and Stamina;Knowledge and understanding of Target Heart Rate Range (THRR);Able to understand and use rate of perceived exertion (RPE) scale      Comments Pt first day in the [facility] program. Pt tolerated exercise well with an average MET level of 4.29, Pt is leanring his THRR, RPE and ExRx. Off to a great start Reviewed MET's, goals and home ExRx. Pt tolerated exercise well with an average MET level of 5.68, Pt feels good about his goals and has a plan to get on track now that the holidays are over. He may also meet with the RD to come up with a plan. Pt Will continue to exercise in his home gym: Peloton, TM, WTS, rower and situp machine, for 30-45 mins 2 days a week. Also talked over gradual wt progression      Expected Outcomes Will continue to monitor pt and progress workloads as tolerated without sign  or symptom Will continue to monitor pt and progress workloads as tolerated without sign or symptom  Discharge Exercise Prescription (Final Exercise Prescription Changes):  Exercise Prescription Changes - 10/26/23 0841       Response to Exercise   Blood Pressure (Admit) 102/70    Blood Pressure (Exercise) 126/64    Blood Pressure (Exit) 96/56    Heart Rate (Admit) 72 bpm    Heart Rate (Exercise) 110 bpm    Heart Rate (Exit) 84 bpm    Rating of Perceived Exertion (Exercise) 12.5    Perceived Dyspnea (Exercise) 0    Symptoms 0    Comments Reviewed MET's, goals and home ExRx    Duration Progress to 30 minutes of  aerobic without signs/symptoms of physical distress    Intensity THRR unchanged      Progression   Progression Continue to progress workloads to maintain intensity without signs/symptoms of physical distress.    Average METs 5.68      Resistance Training   Training Prescription Yes    Weight 5 lbs    Reps 10-15    Time 10 Minutes      Bike   Level 4    Watts 105    Minutes 15    METs 4.5      Rower   Level 3    Watts 126    Minutes 15    METs 6.86      Home Exercise Plan   Plans to continue exercise at Home (comment)    Frequency Add 2 additional days to program exercise sessions.    Initial Home Exercises Provided 10/26/23             Nutrition:  Target Goals: Understanding of nutrition guidelines, daily intake of sodium 1500mg , cholesterol 200mg , calories 30% from fat and 7% or less from saturated fats, daily to have 5 or more servings of fruits and vegetables.  Biometrics:  Pre Biometrics - 10/02/23 0823       Pre Biometrics   Waist Circumference 47.5 inches    Hip Circumference 47.75 inches    Waist to Hip Ratio 0.99 %    Triceps Skinfold 19 mm    % Body Fat 33.9 %    Grip Strength 60 kg    Flexibility 15.13 in    Single Leg Stand 30 seconds              Nutrition Therapy Plan and Nutrition  Goals:   Nutrition Assessments:  Nutrition Assessments - 11/02/23 0748       Rate Your Plate Scores   Pre Score 60            MEDIFICTS Score Key: >=70 Need to make dietary changes  40-70 Heart Healthy Diet <= 40 Therapeutic Level Cholesterol Diet   Flowsheet Row INTENSIVE CARDIAC REHAB from 11/02/2023 in [facility]  Picture Your Plate Total Score on Admission 60      Picture Your Plate Scores: <57 Unhealthy dietary pattern with much room for improvement. 41-50 Dietary pattern unlikely to meet recommendations for good health and room for improvement. 51-60 More healthful dietary pattern, with some room for improvement.  >60 Healthy dietary pattern, although there may be some specific behaviors that could be improved.    Nutrition Goals Re-Evaluation:   Nutrition Goals Re-Evaluation:   Nutrition Goals Discharge (Final Nutrition Goals Re-Evaluation):   Psychosocial: Target Goals: Acknowledge presence or absence of significant depression and/or stress, maximize coping skills, provide positive support system. Participant is able to verbalize types and ability to  use techniques and skills needed for reducing stress and depression.  Initial Review & Psychosocial Screening:  Initial Psych Review & Screening - 10/02/23 0906       Initial Review   Current issues with None Identified      Family Dynamics   Good Support System? Yes    Comments Wife, family, friends, church members.      Barriers   Psychosocial barriers to participate in program The patient should benefit from training in stress management and relaxation.      Screening Interventions   Interventions Encouraged to exercise    Expected Outcomes Short Term goal: Identification and review with participant of any Quality of Life or Depression concerns found by scoring the questionnaire.;Long Term goal: The participant improves quality of Life and  PHQ9 Scores as seen by post scores and/or verbalization of changes             Quality of Life Scores:  Quality of Life - 10/02/23 1258       Quality of Life   Select Quality of Life      Quality of Life Scores   Health/Function Pre 25.93 %    Socioeconomic Pre 25.93 %    Psych/Spiritual Pre 27.07 %    Family Pre 28.8 %    GLOBAL Pre 26.59 %            Scores of 19 and below usually indicate a poorer quality of life in these areas.  A difference of  2-3 points is a clinically meaningful difference.  A difference of 2-3 points in the total score of the Quality of Life Index has been associated with significant improvement in overall quality of life, self-image, physical symptoms, and general health in studies assessing change in quality of life.  PHQ-9: Review Flowsheet       10/02/2023  Depression screen PHQ 2/9  Decreased Interest 1  Down, Depressed, Hopeless 0  PHQ - 2 Score 1  Altered sleeping 0  Tired, decreased energy 0  Change in appetite 0  Feeling bad or failure about yourself  1  Trouble concentrating 0  Moving slowly or fidgety/restless 0  Suicidal thoughts 0  PHQ-9 Score 2  Difficult doing work/chores Not difficult at all   Interpretation of Total Score  Total Score Depression Severity:  1-4 = Minimal depression, 5-9 = Mild depression, 10-14 = Moderate depression, 15-19 = Moderately severe depression, 20-27 = Severe depression   Psychosocial Evaluation and Intervention:   Psychosocial Re-Evaluation:  Psychosocial Re-Evaluation     Row Name 10/25/23 1104 11/22/23 0813           Psychosocial Re-Evaluation   Current issues with None Identified None Identified      Interventions Encouraged to attend Cardiac Rehabilitation for the exercise Encouraged to attend Cardiac Rehabilitation for the exercise      Continue Psychosocial Services  No Follow up required No Follow up required               Psychosocial Discharge (Final Psychosocial  Re-Evaluation):  Psychosocial Re-Evaluation - 11/22/23 0813       Psychosocial Re-Evaluation   Current issues with None Identified    Interventions Encouraged to attend Cardiac Rehabilitation for the exercise    Continue Psychosocial Services  No Follow up required             Vocational Rehabilitation: Provide vocational rehab assistance to qualifying candidates.   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 No VR needs at this time.             Education: Education Goals: Education classes will be provided on a weekly basis, covering required topics. Participant will state understanding/return demonstration of topics presented.    Education     Row Name 10/08/23 0900     Education   Cardiac Education Topics [facility]   Select Workshops     Workshops   Educator Exercise Physiologist   Select Exercise   Exercise Workshop Exercise Basics: Building Your Action Plan   Instruction Review Code 1- Verbalizes Understanding   Class Start Time [id number]   Class Stop Time 0850   Class Time Calculation (min) 38 min    Row Name 10/10/23 1500     Education   Cardiac Education Topics [facility]   Customer service manager   Weekly Topic One-Pot Wonders   Instruction Review Code 1- Verbalizes Understanding   Class Start Time 1359   Class Stop Time 1436   Class Time Calculation (min) 37 min            Core Videos: Exercise    Move It!  Clinical staff conducted group or individual video education with verbal and written material and guidebook.  Patient learns the recommended [facility] exercise program. Exercise with the goal of living a long, healthy life. Some of the health  benefits of exercise include controlled diabetes, healthier blood pressure levels, improved cholesterol levels, improved heart and lung capacity, improved sleep, and better body composition. Everyone should speak with their doctor before starting or changing an exercise routine.  Biomechanical Limitations Clinical staff conducted group or individual video education with verbal and written material and guidebook.  Patient learns how biomechanical limitations can impact exercise and how we can mitigate and possibly overcome limitations to have an impactful and balanced exercise routine.  Body Composition Clinical staff conducted group or individual video education with verbal and written material and guidebook.  Patient learns that body composition (ratio of muscle mass to fat mass) is a key component to assessing overall fitness, rather than body weight alone. Increased fat mass, especially visceral belly fat, can put [place] at increased risk for metabolic syndrome, type 2 diabetes, heart disease, and even death. It is recommended to combine diet and exercise (cardiovascular and resistance training) to improve your body composition. Seek guidance from your physician and exercise physiologist before implementing an exercise routine.  Exercise Action Plan Clinical staff conducted group or individual video education with verbal and written material and guidebook.  Patient learns the recommended strategies to achieve and enjoy long-term exercise adherence, including variety, self-motivation, self-efficacy, and positive decision making. Benefits of exercise include fitness, good health, weight management, more energy, better sleep, less stress, and overall well-being.  Medical   Heart Disease Risk Reduction Clinical staff conducted group or individual video education with verbal and written material and guidebook.  Patient learns our heart is our most vital organ as it circulates oxygen, nutrients, white  blood cells, and hormones throughout the entire body, and carries waste away. Data supports a plant-based eating plan like the [facility] Program for its effectiveness in slowing progression of and reversing heart disease. The  video provides a number of recommendations to address heart disease.   Metabolic Syndrome and Belly Fat  Clinical staff conducted group or individual video education with verbal and written material and guidebook.  Patient learns what metabolic syndrome is, how it leads to heart disease, and how one can reverse it and keep it from coming back. You have metabolic syndrome if you have 3 of the following 5 criteria: abdominal obesity, high blood pressure, high triglycerides, low HDL cholesterol, and high blood sugar.  Hypertension and Heart Disease Clinical staff conducted group or individual video education with verbal and written material and guidebook.  Patient learns that high blood pressure, or hypertension, is very common in the [place]. Hypertension is largely due to excessive salt intake, but other important risk factors include being overweight, physical inactivity, drinking too much alcohol, smoking, and not eating enough potassium from fruits and vegetables. High blood pressure is a leading risk factor for heart attack, stroke, congestive heart failure, dementia, kidney failure, and premature death. Long-term effects of excessive salt intake include stiffening of the arteries and thickening of heart muscle and organ damage. Recommendations include ways to reduce hypertension and the risk of heart disease.  Diseases of Our Time - Focusing on Diabetes Clinical staff conducted group or individual video education with verbal and written material and guidebook.  Patient learns why the best way to stop diseases of our time is prevention, through food and other lifestyle changes. Medicine (such as prescription pills and surgeries) is often only a Band-Aid on the problem, not a  long-term solution. Most common diseases of our time include obesity, type 2 diabetes, hypertension, heart disease, and cancer. The [facility] Program is recommended and has been proven to help reduce, reverse, and/or prevent the damaging effects of metabolic syndrome.  Nutrition   Overview of the [facility] Eating Plan  Clinical staff conducted group or individual video education with verbal and written material and guidebook.  Patient learns about the [facility] Eating Plan for disease risk reduction. The [facility] Eating Plan emphasizes a wide variety of unrefined, minimally-processed carbohydrates, like fruits, vegetables, whole grains, and legumes. Go, Caution, and Stop food choices are explained. Plant-based and lean animal proteins are emphasized. Rationale provided for low sodium intake for blood pressure control, low added sugars for blood sugar stabilization, and low added fats and oils for coronary artery disease risk reduction and weight management.  Calorie Density  Clinical staff conducted group or individual video education with verbal and written material and guidebook.  Patient learns about calorie density and how it impacts the [facility] Eating Plan. Knowing the characteristics of the food you choose will help you decide whether those foods will lead to weight gain or weight loss, and whether you want to consume more or less of them. Weight loss is usually a side effect of the Pritikin Eating Plan because of its focus on low calorie-dense foods.  Label Reading  Clinical staff conducted group or individual video education with verbal and written material and guidebook.  Patient learns about the Pritikin recommended label reading guidelines and corresponding recommendations regarding calorie density, added sugars, sodium content, and whole grains.  Dining Out - Part 1  Clinical staff conducted group or individual video education with verbal and written material and guidebook.  Patient learns  that restaurant meals can be sabotaging because they can be so high in calories, fat, sodium, and/or sugar. Patient learns recommended strategies on how to positively address this and avoid unhealthy pitfalls.  Facts  on Fats  Clinical staff conducted group or individual video education with verbal and written material and guidebook.  Patient learns that lifestyle modifications can be just as effective, if not more so, as many medications for lowering your risk of heart disease. A Pritikin lifestyle can help to reduce your risk of inflammation and atherosclerosis (cholesterol build-up, or plaque, in the artery walls). Lifestyle interventions such as dietary choices and physical activity address the cause of atherosclerosis. A review of the types of fats and their impact on blood cholesterol levels, along with dietary recommendations to reduce fat intake is also included.  Nutrition Action Plan  Clinical staff conducted group or individual video education with verbal and written material and guidebook.  Patient learns how to incorporate Pritikin recommendations into their lifestyle. Recommendations include planning and keeping personal health goals in mind as an important part of their success.  Healthy Mind-Set    Healthy Minds, Bodies, Hearts  Clinical staff conducted group or individual video education with verbal and written material and guidebook.  Patient learns how to identify when they are stressed. Video will discuss the impact of that stress, as well as the many benefits of stress management. Patient will also be introduced to stress management techniques. The way we think, act, and feel has an impact on our hearts.  How Our Thoughts Can Heal Our Hearts  Clinical staff conducted group or individual video education with verbal and written material and guidebook.  Patient learns that negative thoughts can cause depression and anxiety. This can result in negative lifestyle behavior and serious  health problems. Cognitive behavioral therapy is an effective method to help control our thoughts in order to change and improve our emotional outlook.  Additional Videos:  Exercise    Improving Performance  Clinical staff conducted group or individual video education with verbal and written material and guidebook.  Patient learns to use a non-linear approach by alternating intensity levels and lengths of time spent exercising to help burn more calories and lose more body fat. Cardiovascular exercise helps improve heart health, metabolism, hormonal balance, blood sugar control, and recovery from fatigue. Resistance training improves strength, endurance, balance, coordination, reaction time, metabolism, and muscle mass. Flexibility exercise improves circulation, posture, and balance. Seek guidance from your physician and exercise physiologist before implementing an exercise routine and learn your capabilities and proper form for all exercise.  Introduction to Yoga  Clinical staff conducted group or individual video education with verbal and written material and guidebook.  Patient learns about yoga, a discipline of the coming together of mind, breath, and body. The benefits of yoga include improved flexibility, improved range of motion, better posture and core strength, increased lung function, weight loss, and positive self-image. Yoga's heart health benefits include lowered blood pressure, healthier heart rate, decreased cholesterol and triglyceride levels, improved immune function, and reduced stress. Seek guidance from your physician and exercise physiologist before implementing an exercise routine and learn your capabilities and proper form for all exercise.  Medical   Aging: Enhancing Your Quality of Life  Clinical staff conducted group or individual video education with verbal and written material and guidebook.  Patient learns key strategies and recommendations to stay in good physical health  and enhance quality of life, such as prevention strategies, having an advocate, securing a Health Care Proxy and Power of Attorney, and keeping a list of medications and system for tracking them. It also discusses how to avoid risk for bone loss.  Biology of Weight Control  Clinical staff conducted group or individual video education with verbal and written material and guidebook.  Patient learns that weight gain occurs because we consume more calories than we burn (eating more, moving less). Even if your body weight is normal, you may have higher ratios of fat compared to muscle mass. Too much body fat puts you at increased risk for cardiovascular disease, heart attack, stroke, type 2 diabetes, and obesity-related cancers. In addition to exercise, following the Pritikin Eating Plan can help reduce your risk.  Decoding Lab Results  Clinical staff conducted group or individual video education with verbal and written material and guidebook.  Patient learns that lab test reflects one measurement whose values change over time and are influenced by many factors, including medication, stress, sleep, exercise, food, hydration, pre-existing medical conditions, and more. It is recommended to use the knowledge from this video to become more involved with your lab results and evaluate your numbers to speak with your doctor.   Diseases of Our Time - Overview  Clinical staff conducted group or individual video education with verbal and written material and guidebook.  Patient learns that according to the CDC, 50% to 70% of chronic diseases (such as obesity, type 2 diabetes, elevated lipids, hypertension, and heart disease) are avoidable through lifestyle improvements including healthier food choices, listening to satiety cues, and increased physical activity.  Sleep Disorders Clinical staff conducted group or individual video education with verbal and written material and guidebook.  Patient learns how good  quality and duration of sleep are important to overall health and well-being. Patient also learns about sleep disorders and how they impact health along with recommendations to address them, including discussing with a physician.  Nutrition  Dining Out - Part 2 Clinical staff conducted group or individual video education with verbal and written material and guidebook.  Patient learns how to plan ahead and communicate in order to maximize their dining experience in a healthy and nutritious manner. Included are recommended food choices based on the type of restaurant the patient is visiting.   Fueling a Banker conducted group or individual video education with verbal and written material and guidebook.  There is a strong connection between our food choices and our health. Diseases like obesity and type 2 diabetes are very prevalent and are in large-part due to lifestyle choices. The Pritikin Eating Plan provides plenty of food and hunger-curbing satisfaction. It is easy to follow, affordable, and helps reduce health risks.  Menu Workshop  Clinical staff conducted group or individual video education with verbal and written material and guidebook.  Patient learns that restaurant meals can sabotage health goals because they are often packed with calories, fat, sodium, and sugar. Recommendations include strategies to plan ahead and to communicate with the manager, chef, or server to help order a healthier meal.  Planning Your Eating Strategy  Clinical staff conducted group or individual video education with verbal and written material and guidebook.  Patient learns about the Pritikin Eating Plan and its benefit of reducing the risk of disease. The Pritikin Eating Plan does not focus on calories. Instead, it emphasizes high-quality, nutrient-rich foods. By knowing the characteristics of the foods, we choose, we can determine their calorie density and make informed  decisions.  Targeting Your Nutrition Priorities  Clinical staff conducted group or individual video education with verbal and written material and guidebook.  Patient learns that lifestyle habits have a tremendous impact on disease risk and progression. This video provides eating  and physical activity recommendations based on your personal health goals, such as reducing LDL cholesterol, losing weight, preventing or controlling type 2 diabetes, and reducing high blood pressure.  Vitamins and Minerals  Clinical staff conducted group or individual video education with verbal and written material and guidebook.  Patient learns different ways to obtain key vitamins and minerals, including through a recommended healthy diet. It is important to discuss all supplements you take with your doctor.   Healthy Mind-Set    Smoking Cessation  Clinical staff conducted group or individual video education with verbal and written material and guidebook.  Patient learns that cigarette smoking and tobacco addiction pose a serious health risk which affects millions of people. Stopping smoking will significantly reduce the risk of heart disease, lung disease, and many forms of cancer. Recommended strategies for quitting are covered, including working with your doctor to develop a successful plan.  Culinary   Becoming a Set designer conducted group or individual video education with verbal and written material and guidebook.  Patient learns that cooking at home can be healthy, cost-effective, quick, and puts them in control. Keys to cooking healthy recipes will include looking at your recipe, assessing your equipment needs, planning ahead, making it simple, choosing cost-effective seasonal ingredients, and limiting the use of added fats, salts, and sugars.  Cooking - Breakfast and Snacks  Clinical staff conducted group or individual video education with verbal and written material and guidebook.   Patient learns how important breakfast is to satiety and nutrition through the entire day. Recommendations include key foods to eat during breakfast to help stabilize blood sugar levels and to prevent overeating at meals later in the day. Planning ahead is also a key component.  Cooking - Educational psychologist conducted group or individual video education with verbal and written material and guidebook.  Patient learns eating strategies to improve overall health, including an approach to cook more at home. Recommendations include thinking of animal protein as a side on your plate rather than center stage and focusing instead on lower calorie dense options like vegetables, fruits, whole grains, and plant-based proteins, such as beans. Making sauces in large quantities to freeze for later and leaving the skin on your vegetables are also recommended to maximize your experience.  Cooking - Healthy Salads and Dressing Clinical staff conducted group or individual video education with verbal and written material and guidebook.  Patient learns that vegetables, fruits, whole grains, and legumes are the foundations of the Pritikin Eating Plan. Recommendations include how to incorporate each of these in flavorful and healthy salads, and how to create homemade salad dressings. Proper handling of ingredients is also covered. Cooking - Soups and [employer] - Soups and Desserts Clinical staff conducted group or individual video education with verbal and written material and guidebook.  Patient learns that Pritikin soups and desserts make for easy, nutritious, and delicious snacks and meal components that are low in sodium, fat, sugar, and calorie density, while high in vitamins, minerals, and filling fiber. Recommendations include simple and healthy ideas for soups and desserts.   Overview     The Pritikin Solution Program Overview Clinical staff conducted group or individual video education with  verbal and written material and guidebook.  Patient learns that the results of the Pritikin Program have been documented in more than 100 articles published in peer-reviewed journals, and the benefits include reducing risk factors for (and, in some cases, even reversing) high cholesterol,  high blood pressure, type 2 diabetes, obesity, and more! An overview of the three key pillars of the Pritikin Program will be covered: eating well, doing regular exercise, and having a healthy mind-set.  WORKSHOPS  Exercise: Exercise Basics: Building Your Action Plan Clinical staff led group instruction and group discussion with PowerPoint presentation and patient guidebook. To enhance the learning environment the use of posters, models and videos may be added. At the conclusion of this workshop, patients will comprehend the difference between physical activity and exercise, as well as the benefits of incorporating both, into their routine. Patients will understand the FITT (Frequency, Intensity, Time, and Type) principle and how to use it to build an exercise action plan. In addition, safety concerns and other considerations for exercise and cardiac rehab will be addressed by the presenter. The purpose of this lesson is to promote a comprehensive and effective weekly exercise routine in order to improve patients' overall level of fitness.   Managing Heart Disease: Your Path to a Healthier Heart Clinical staff led group instruction and group discussion with PowerPoint presentation and patient guidebook. To enhance the learning environment the use of posters, models and videos may be added.At the conclusion of this workshop, patients will understand the anatomy and physiology of the heart. Additionally, they will understand how Pritikin's three pillars impact the risk factors, the progression, and the management of heart disease.  The purpose of this lesson is to provide a high-level overview of the heart, heart  disease, and how the Pritikin lifestyle positively impacts risk factors.  Exercise Biomechanics Clinical staff led group instruction and group discussion with PowerPoint presentation and patient guidebook. To enhance the learning environment the use of posters, models and videos may be added. Patients will learn how the structural parts of their bodies function and how these functions impact their daily activities, movement, and exercise. Patients will learn how to promote a neutral spine, learn how to manage pain, and identify ways to improve their physical movement in order to promote healthy living. The purpose of this lesson is to expose patients to common physical limitations that impact physical activity. Participants will learn practical ways to adapt and manage aches and pains, and to minimize their effect on regular exercise. Patients will learn how to maintain good posture while sitting, walking, and lifting.  Balance Training and Fall Prevention  Clinical staff led group instruction and group discussion with PowerPoint presentation and patient guidebook. To enhance the learning environment the use of posters, models and videos may be added. At the conclusion of this workshop, patients will understand the importance of their sensorimotor skills (vision, proprioception, and the vestibular system) in maintaining their ability to balance as they age. Patients will apply a variety of balancing exercises that are appropriate for their current level of function. Patients will understand the common causes for poor balance, possible solutions to these problems, and ways to modify their physical environment in order to minimize their fall risk. The purpose of this lesson is to teach patients about the importance of maintaining balance as they age and ways to minimize their risk of falling.  WORKSHOPS   Nutrition:  Fueling a Ship broker led group instruction and group  discussion with PowerPoint presentation and patient guidebook. To enhance the learning environment the use of posters, models and videos may be added. Patients will review the foundational principles of the Pritikin Eating Plan and understand what constitutes a serving size in each of the food groups. Patients will  also learn Pritikin-friendly foods that are better choices when away from home and review make-ahead meal and snack options. Calorie density will be reviewed and applied to three nutrition priorities: weight maintenance, weight loss, and weight gain. The purpose of this lesson is to reinforce (in a group setting) the key concepts around what patients are recommended to eat and how to apply these guidelines when away from home by planning and selecting Pritikin-friendly options. Patients will understand how calorie density may be adjusted for different weight management goals.  Mindful Eating  Clinical staff led group instruction and group discussion with PowerPoint presentation and patient guidebook. To enhance the learning environment the use of posters, models and videos may be added. Patients will briefly review the concepts of the Pritikin Eating Plan and the importance of low-calorie dense foods. The concept of mindful eating will be introduced as well as the importance of paying attention to internal hunger signals. Triggers for non-hunger eating and techniques for dealing with triggers will be explored. The purpose of this lesson is to provide patients with the opportunity to review the basic principles of the Pritikin Eating Plan, discuss the value of eating mindfully and how to measure internal cues of hunger and fullness using the Hunger Scale. Patients will also discuss reasons for non-hunger eating and learn strategies to use for controlling emotional eating.  Targeting Your Nutrition Priorities Clinical staff led group instruction and group discussion with PowerPoint presentation and  patient guidebook. To enhance the learning environment the use of posters, models and videos may be added. Patients will learn how to determine their genetic susceptibility to disease by reviewing their family history. Patients will gain insight into the importance of diet as part of an overall healthy lifestyle in mitigating the impact of genetics and other environmental insults. The purpose of this lesson is to provide patients with the opportunity to assess their personal nutrition priorities by looking at their family history, their own health history and current risk factors. Patients will also be able to discuss ways of prioritizing and modifying the Pritikin Eating Plan for their highest risk areas  Menu  Clinical staff led group instruction and group discussion with PowerPoint presentation and patient guidebook. To enhance the learning environment the use of posters, models and videos may be added. Using menus brought in from [employer], or printed from [employer], patients will apply the Pritikin dining out guidelines that were presented in the [employer] video. Patients will also be able to practice these guidelines in a variety of provided scenarios. The purpose of this lesson is to provide patients with the opportunity to practice hands-on learning of the Pritikin Dining Out guidelines with actual menus and practice scenarios.  Label Reading Clinical staff led group instruction and group discussion with PowerPoint presentation and patient guidebook. To enhance the learning environment the use of posters, models and videos may be added. Patients will review and discuss the Pritikin label reading guidelines presented in Pritikin's Label Reading Educational series video. Using fool labels brought in from local grocery stores and markets, patients will apply the label reading guidelines and determine if the packaged food meet the Pritikin guidelines. The purpose of this  lesson is to provide patients with the opportunity to review, discuss, and practice hands-on learning of the Pritikin Label Reading guidelines with actual packaged food labels. Cooking School  Pritikin's [employer] are designed to teach patients ways to prepare quick, simple, and affordable recipes at home. The importance  of nutrition's role in chronic disease risk reduction is reflected in its emphasis in the overall Pritikin program. By learning how to prepare essential core Pritikin Eating Plan recipes, patients will increase control over what they eat; be able to customize the flavor of foods without the use of added salt, sugar, or fat; and improve the quality of the food they consume. By learning a set of core recipes which are easily assembled, quickly prepared, and affordable, patients are more likely to prepare more healthy foods at home. These workshops focus on convenient breakfasts, simple entres, side dishes, and desserts which can be prepared with minimal effort and are consistent with nutrition recommendations for cardiovascular risk reduction. Cooking [employer] are taught by a Armed forces logistics/support/administrative officer (RD) who has been trained by the [employer]. The chef or RD has a clear understanding of the importance of minimizing - if not completely eliminating - added fat, sugar, and sodium in recipes. Throughout the series of Cooking School Workshop sessions, patients will learn about healthy ingredients and efficient methods of cooking to build confidence in their capability to prepare    Cooking School weekly topics:  Adding Flavor- Sodium-Free  Fast and Healthy Breakfasts  Powerhouse Plant-Based Proteins  Satisfying Salads and Dressings  Simple Sides and Sauces  International Cuisine-Spotlight on the [employer] Zones  Delicious Desserts  Savory Soups  [employer] - Meals in a Astronomer Appetizers and Snacks  Comforting Weekend Breakfasts  One-Pot  Wonders   Fast Evening Meals  Landscape architect Your Pritikin Plate  WORKSHOPS   Healthy Mindset (Psychosocial):  Focused Goals, Sustainable Changes Clinical staff led group instruction and group discussion with PowerPoint presentation and patient guidebook. To enhance the learning environment the use of posters, models and videos may be added. Patients will be able to apply effective goal setting strategies to establish at least one personal goal, and then take consistent, meaningful action toward that goal. They will learn to identify common barriers to achieving personal goals and develop strategies to overcome them. Patients will also gain an understanding of how our mind-set can impact our ability to achieve goals and the importance of cultivating a positive and growth-oriented mind-set. The purpose of this lesson is to provide patients with a deeper understanding of how to set and achieve personal goals, as well as the tools and strategies needed to overcome common obstacles which may arise along the way.  From Head to Heart: The Power of a Healthy Outlook  Clinical staff led group instruction and group discussion with PowerPoint presentation and patient guidebook. To enhance the learning environment the use of posters, models and videos may be added. Patients will be able to recognize and describe the impact of emotions and mood on physical health. They will discover the importance of self-care and explore self-care practices which may work for them. Patients will also learn how to utilize the 4 C's to cultivate a healthier outlook and better manage stress and challenges. The purpose of this lesson is to demonstrate to patients how a healthy outlook is an essential part of maintaining good health, especially as they continue their cardiac rehab journey.  Healthy Sleep for a Healthy Heart Clinical staff led group instruction and group discussion with PowerPoint presentation and  patient guidebook. To enhance the learning environment the use of posters, models and videos may be added. At the conclusion of this workshop, patients will be able to demonstrate knowledge of the importance of sleep  to overall health, well-being, and quality of life. They will understand the symptoms of, and treatments for, common sleep disorders. Patients will also be able to identify daytime and nighttime behaviors which impact sleep, and they will be able to apply these tools to help manage sleep-related challenges. The purpose of this lesson is to provide patients with a general overview of sleep and outline the importance of quality sleep. Patients will learn about a few of the most common sleep disorders. Patients will also be introduced to the concept of "sleep hygiene," and discover ways to self-manage certain sleeping problems through simple daily behavior changes. Finally, the workshop will motivate patients by clarifying the links between quality sleep and their goals of heart-healthy living.   Recognizing and Reducing Stress Clinical staff led group instruction and group discussion with PowerPoint presentation and patient guidebook. To enhance the learning environment the use of posters, models and videos may be added. At the conclusion of this workshop, patients will be able to understand the types of stress reactions, differentiate between acute and chronic stress, and recognize the impact that chronic stress has on their health. They will also be able to apply different coping mechanisms, such as reframing negative self-talk. Patients will have the opportunity to practice a variety of stress management techniques, such as deep abdominal breathing, progressive muscle relaxation, and/or guided imagery.  The purpose of this lesson is to educate patients on the role of stress in their lives and to provide healthy techniques for coping with it.  Learning Barriers/Preferences:  Learning  Barriers/Preferences - 10/02/23 0908       Learning Barriers/Preferences   Learning Barriers None    Learning Preferences Skilled Demonstration;Video             Education Topics:  Knowledge Questionnaire Score:  Knowledge Questionnaire Score - 10/02/23 0914       Knowledge Questionnaire Score   Pre Score 22/24             Core Components/Risk Factors/Patient Goals at Admission:  Personal Goals and Risk Factors at Admission - 10/02/23 0839       Core Components/Risk Factors/Patient Goals on Admission    Weight Management Yes;Obesity;Weight Loss    Intervention Weight Management/Obesity: Establish reasonable short term and long term weight goals.;Obesity: Provide education and appropriate resources to help participant work on and attain dietary goals.    Expected Outcomes Short Term: Continue to assess and modify interventions until short term weight is achieved;Long Term: Adherence to nutrition and physical activity/exercise program aimed toward attainment of established weight goal;Weight Loss: Understanding of general recommendations for a balanced deficit meal plan, which promotes 1-2 lb weight loss per week and includes a negative energy balance of [phone] kcal/d    Hypertension Yes    Intervention Provide education on lifestyle modifcations including regular physical activity/exercise, weight management, moderate sodium restriction and increased consumption of fresh fruit, vegetables, and low fat dairy, alcohol moderation, and smoking cessation.;Monitor prescription use compliance.    Expected Outcomes Short Term: Continued assessment and intervention until BP is < 140/37mm HG in hypertensive participants. < 130/12mm HG in hypertensive participants with diabetes, heart failure or chronic kidney disease.;Long Term: Maintenance of blood pressure at goal levels.    Lipids Yes    Intervention Provide education and support for participant on nutrition & aerobic/resistive  exercise along with prescribed medications to achieve LDL 70mg , HDL >40mg .    Expected Outcomes Short Term: Participant states understanding of desired cholesterol values and is compliant  with medications prescribed. Participant is following exercise prescription and nutrition guidelines.;Long Term: Cholesterol controlled with medications as prescribed, with individualized exercise RX and with personalized nutrition plan. Value goals: LDL < 70mg , HDL > 40 mg.    Personal Goal Other Yes    Personal Goal Be able to exercise 30-60 minutes 6 days/week. Create an active lifestyle. Learn heart healthy recipes and proper portions for maintaining healthy weight. Stay motivated to control weight and not revert to bad habits.    Intervention Devin Wright will attend Exercise, Nutrition, and Healthy Mind-Set workshops to help develop a personalized exercise action plan, nutrition plan, and learn how to set and maintain SMART goals.    Expected Outcomes Devin Wright will comply with and maintain exercise and nutrition plans.             Core Components/Risk Factors/Patient Goals Review:   Goals and Risk Factor Review     Row Name 10/25/23 1105 11/22/23 0813           Core Components/Risk Factors/Patient Goals Review   Personal Goals Review Weight Management/Obesity;Lipids;Hypertension Weight Management/Obesity;Lipids;Hypertension      Review Devin Wright is off to a good start to exercise at cardiac rehab. . Vital signs have been stable. Devin Wright is doing well with  exercise at cardiac rehab. . Vital signs have been stable. Devin Wright has lost 2.1 kg since starting cardiac rehab. Devin Wright is currently out due to business travel      Expected Outcomes Devin Wright will continue to participate in cardiac rehab for exercise, nutrition and lifestyle modifications Devin Wright will continue to participate in cardiac rehab for exercise, nutrition and lifestyle modifications               Core Components/Risk Factors/Patient Goals  at Discharge (Final Review):   Goals and Risk Factor Review - 11/22/23 0813       Core Components/Risk Factors/Patient Goals Review   Personal Goals Review Weight Management/Obesity;Lipids;Hypertension    Review Devin Wright is doing well with  exercise at cardiac rehab. . Vital signs have been stable. Devin Wright has lost 2.1 kg since starting cardiac rehab. Devin Wright is currently out due to business travel    Expected Outcomes Devin Wright will continue to participate in cardiac rehab for exercise, nutrition and lifestyle modifications             ITP Comments:  ITP Comments     Row Name 10/02/23 1610 10/25/23 1103 11/22/23 0811       ITP Comments Medical Director- Dr. Armanda Magic, MD. Introduction to the [facility] / Intensive Cardiac Rehab. Reviewed initial orientation folder with him. 30 Day ITP Review. Devin Wright has good attendance and participation in cardiac rehab. Devin Wright is off to a good start to exercise. 30 Day ITP Review. Devin Wright has good participation in cardiac rehab when in attendance.Devin Wright has been absent from cardiac rehab due to work obligations/travel              Comments: See ITP comments.Devin Wright

## 2023-11-23 ENCOUNTER — Encounter (HOSPITAL_COMMUNITY): Payer: BC Managed Care – PPO

## 2023-11-23 ENCOUNTER — Telehealth: Payer: Self-pay | Admitting: Cardiology

## 2023-11-23 MED ORDER — TICAGRELOR 90 MG PO TABS: 90.0000 mg | ORAL_TABLET | Freq: Two times a day (BID) | ORAL | 3 refills | Status: DC

## 2023-11-23 NOTE — Telephone Encounter (Signed)
 Pt's medication was sent to pt's pharmacy as requested. Confirmation received.

## 2023-11-23 NOTE — Telephone Encounter (Signed)
*  STAT* If patient is at the pharmacy, call can be transferred to refill team.   1. Which medications need to be refilled? (please list name of each medication and dose if known)   ticagrelor (BRILINTA) 90 MG TABS tablet   2. Which pharmacy/location (including street and city if local pharmacy) is medication to be sent to? [facility] [place], [place] - [address] [facility] [address] Phone: [phone]  Fax: [phone]     3. Do they need a 30 day or 90 day supply? 90  Pt has been out of medication for 3 days

## 2023-11-26 ENCOUNTER — Encounter (HOSPITAL_COMMUNITY): Payer: BC Managed Care – PPO

## 2023-11-28 ENCOUNTER — Encounter (HOSPITAL_COMMUNITY): Payer: BC Managed Care – PPO

## 2023-11-30 ENCOUNTER — Encounter (HOSPITAL_COMMUNITY): Payer: BC Managed Care – PPO

## 2023-12-03 ENCOUNTER — Encounter (HOSPITAL_COMMUNITY): Payer: BC Managed Care – PPO

## 2023-12-05 ENCOUNTER — Encounter (HOSPITAL_COMMUNITY): Payer: BC Managed Care – PPO

## 2023-12-07 ENCOUNTER — Encounter (HOSPITAL_COMMUNITY): Payer: BC Managed Care – PPO

## 2023-12-09 ENCOUNTER — Encounter (HOSPITAL_COMMUNITY): Payer: Self-pay | Admitting: Emergency Medicine

## 2023-12-09 ENCOUNTER — Emergency Department (HOSPITAL_COMMUNITY): Payer: BC Managed Care – PPO

## 2023-12-09 ENCOUNTER — Other Ambulatory Visit: Payer: Self-pay

## 2023-12-09 ENCOUNTER — Inpatient Hospital Stay (HOSPITAL_COMMUNITY): Payer: BC Managed Care – PPO

## 2023-12-09 ENCOUNTER — Observation Stay (HOSPITAL_COMMUNITY)
Admission: EM | Admit: 2023-12-09 | Discharge: 2023-12-11 | Disposition: A | Discharge status: Home / Self Care | Payer: BC Managed Care – PPO | Attending: Neurology | Admitting: Neurology

## 2023-12-09 DIAGNOSIS — Z7985 Long-term (current) use of injectable non-insulin antidiabetic drugs: Secondary | ICD-10-CM | POA: Diagnosis not present

## 2023-12-09 DIAGNOSIS — E669 Obesity, unspecified: Secondary | ICD-10-CM | POA: Insufficient documentation

## 2023-12-09 DIAGNOSIS — I213 ST elevation (STEMI) myocardial infarction of unspecified site: Principal | ICD-10-CM | POA: Insufficient documentation

## 2023-12-09 DIAGNOSIS — E785 Hyperlipidemia, unspecified: Secondary | ICD-10-CM | POA: Diagnosis present

## 2023-12-09 DIAGNOSIS — I639 Cerebral infarction, unspecified: Secondary | ICD-10-CM | POA: Diagnosis not present

## 2023-12-09 DIAGNOSIS — I10 Essential (primary) hypertension: Secondary | ICD-10-CM | POA: Diagnosis not present

## 2023-12-09 DIAGNOSIS — I2510 Atherosclerotic heart disease of native coronary artery without angina pectoris: Secondary | ICD-10-CM | POA: Diagnosis present

## 2023-12-09 DIAGNOSIS — I700 Atherosclerosis of aorta: Secondary | ICD-10-CM | POA: Diagnosis not present

## 2023-12-09 DIAGNOSIS — Z79899 Other long term (current) drug therapy: Secondary | ICD-10-CM | POA: Insufficient documentation

## 2023-12-09 DIAGNOSIS — Z7902 Long term (current) use of antithrombotics/antiplatelets: Secondary | ICD-10-CM | POA: Diagnosis not present

## 2023-12-09 DIAGNOSIS — N179 Acute kidney failure, unspecified: Secondary | ICD-10-CM | POA: Diagnosis not present

## 2023-12-09 DIAGNOSIS — R29818 Other symptoms and signs involving the nervous system: Secondary | ICD-10-CM | POA: Diagnosis not present

## 2023-12-09 DIAGNOSIS — I6522 Occlusion and stenosis of left carotid artery: Secondary | ICD-10-CM | POA: Diagnosis not present

## 2023-12-09 DIAGNOSIS — F1990 Other psychoactive substance use, unspecified, uncomplicated: Secondary | ICD-10-CM | POA: Insufficient documentation

## 2023-12-09 DIAGNOSIS — I63412 Cerebral infarction due to embolism of left middle cerebral artery: Secondary | ICD-10-CM | POA: Diagnosis not present

## 2023-12-09 DIAGNOSIS — I255 Ischemic cardiomyopathy: Secondary | ICD-10-CM | POA: Diagnosis not present

## 2023-12-09 HISTORY — DX: Cerebral infarction, unspecified: I63.9

## 2023-12-09 LAB — I-STAT CHEM 8, ED
BUN: 16 mg/dL (ref 6–20)
Calcium, Ion: 1.05 mmol/L — ABNORMAL LOW (ref 1.15–1.40)
Chloride: 106 mmol/L (ref 98–111)
Creatinine, Ser: 1.4 mg/dL — ABNORMAL HIGH (ref 0.61–1.24)
Glucose, Bld: 94 mg/dL (ref 70–99)
HCT: 45 % (ref 39.0–52.0)
Hemoglobin: 15.3 g/dL (ref 13.0–17.0)
Potassium: 4 mmol/L (ref 3.5–5.1)
Sodium: 141 mmol/L (ref 135–145)
TCO2: 24 mmol/L (ref 22–32)

## 2023-12-09 LAB — COMPREHENSIVE METABOLIC PANEL
ALT: 57 U/L — ABNORMAL HIGH (ref 0–44)
AST: 35 U/L (ref 15–41)
Albumin: 3.6 g/dL (ref 3.5–5.0)
Alkaline Phosphatase: 68 U/L (ref 38–126)
Anion gap: 5 (ref 5–15)
BUN: 14 mg/dL (ref 6–20)
CO2: 24 mmol/L (ref 22–32)
Calcium: 8.4 mg/dL — ABNORMAL LOW (ref 8.9–10.3)
Chloride: 109 mmol/L (ref 98–111)
Creatinine, Ser: 1.3 mg/dL — ABNORMAL HIGH (ref 0.61–1.24)
GFR, Estimated: >60 mL/min (ref >60)
Glucose, Bld: 100 mg/dL — ABNORMAL HIGH (ref 70–99)
Potassium: 4 mmol/L (ref 3.5–5.1)
Sodium: 138 mmol/L (ref 135–145)
Total Bilirubin: 0.6 mg/dL (ref 0.0–1.2)
Total Protein: 5.9 g/dL — ABNORMAL LOW (ref 6.5–8.1)

## 2023-12-09 LAB — LIPID PANEL
Cholesterol: 124 mg/dL (ref 0–200)
HDL: 28 mg/dL — ABNORMAL LOW (ref >40)
LDL Cholesterol: 64 mg/dL (ref 0–99)
Total CHOL/HDL Ratio: 4.4 {ratio}
Triglycerides: 159 mg/dL — ABNORMAL HIGH (ref <150)
VLDL: 32 mg/dL (ref 0–40)

## 2023-12-09 LAB — CBC
HCT: 46.2 % (ref 39.0–52.0)
HCT: 49.1 % (ref 39.0–52.0)
Hemoglobin: 15.9 g/dL (ref 13.0–17.0)
Hemoglobin: 16.7 g/dL (ref 13.0–17.0)
MCH: 29 pg (ref 26.0–34.0)
MCH: 28.4 pg (ref 26.0–34.0)
MCHC: 34.4 g/dL (ref 30.0–36.0)
MCHC: 34 g/dL (ref 30.0–36.0)
MCV: 84.2 fL (ref 80.0–100.0)
MCV: 83.6 fL (ref 80.0–100.0)
Platelets: 268 10*3/uL (ref 150–400)
Platelets: 282 10*3/uL (ref 150–400)
RBC: 5.49 MIL/uL (ref 4.22–5.81)
RBC: 5.87 MIL/uL — ABNORMAL HIGH (ref 4.22–5.81)
RDW: 11.8 % (ref 11.5–15.5)
RDW: 11.9 % (ref 11.5–15.5)
WBC: 7.4 10*3/uL (ref 4.0–10.5)
WBC: 8.9 10*3/uL (ref 4.0–10.5)
nRBC: 0 % (ref 0.0–0.2)
nRBC: 0 % (ref 0.0–0.2)

## 2023-12-09 LAB — DIFFERENTIAL
Abs Immature Granulocytes: 0.04 10*3/uL (ref 0.00–0.07)
Basophils Absolute: 0.1 10*3/uL (ref 0.0–0.1)
Basophils Relative: 1 %
Eosinophils Absolute: 0.2 10*3/uL (ref 0.0–0.5)
Eosinophils Relative: 3 %
Immature Granulocytes: 1 %
Lymphocytes Relative: 17 %
Lymphs Abs: 1.3 10*3/uL (ref 0.7–4.0)
Monocytes Absolute: 0.5 10*3/uL (ref 0.1–1.0)
Monocytes Relative: 7 %
Neutro Abs: 5.3 10*3/uL (ref 1.7–7.7)
Neutrophils Relative %: 71 %

## 2023-12-09 LAB — CREATININE, SERUM
Creatinine, Ser: 1.27 mg/dL — ABNORMAL HIGH (ref 0.61–1.24)
GFR, Estimated: >60 mL/min (ref >60)

## 2023-12-09 LAB — ETHANOL: Alcohol, Ethyl (B): <10 mg/dL (ref <10)

## 2023-12-09 LAB — PROTIME-INR
INR: 1.1 (ref 0.8–1.2)
Prothrombin Time: 13.9 s (ref 11.4–15.2)

## 2023-12-09 LAB — MRSA NEXT GEN BY PCR, NASAL: MRSA by PCR Next Gen: NOT DETECTED

## 2023-12-09 LAB — APTT: aPTT: 27 s (ref 24–36)

## 2023-12-09 LAB — CBG MONITORING, ED: Glucose-Capillary: 99 mg/dL (ref 70–99)

## 2023-12-09 MED ORDER — ACETAMINOPHEN 160 MG/5ML PO SOLN: 650.0000 mg | ORAL | Status: DC | PRN

## 2023-12-09 MED ORDER — ENOXAPARIN SODIUM 40 MG/0.4ML IJ SOSY
40.0000 mg | PREFILLED_SYRINGE | INTRAMUSCULAR | Status: DC
  Administered 2023-12-09 – 2023-12-10 (×2): 40 mg via SUBCUTANEOUS
  Filled 2023-12-09 (×2): qty 0.4

## 2023-12-09 MED ORDER — STROKE: EARLY STAGES OF RECOVERY BOOK
Freq: Once | Status: AC
  Administered 2023-12-10: 1
  Filled 2023-12-09: qty 1

## 2023-12-09 MED ORDER — ASPIRIN 325 MG PO TABS
325.0000 mg | ORAL_TABLET | Freq: Every day | ORAL | Status: DC
Start: 2023-12-09 — End: 2023-12-10
  Administered 2023-12-09: 325 mg via ORAL
  Filled 2023-12-09: qty 1

## 2023-12-09 MED ORDER — CHLORHEXIDINE GLUCONATE CLOTH 2 % EX PADS: 6.0000 | MEDICATED_PAD | Freq: Every day | CUTANEOUS | Status: DC

## 2023-12-09 MED ORDER — ACETAMINOPHEN 325 MG PO TABS: 650.0000 mg | ORAL_TABLET | ORAL | Status: DC | PRN

## 2023-12-09 MED ORDER — ACETAMINOPHEN 650 MG RE SUPP: 650.0000 mg | RECTAL | Status: DC | PRN

## 2023-12-09 MED ORDER — SODIUM CHLORIDE 0.9% FLUSH
3.0000 mL | Freq: Once | INTRAVENOUS | Status: AC
  Administered 2023-12-09: 3 mL via INTRAVENOUS

## 2023-12-09 MED ORDER — IOHEXOL 350 MG/ML SOLN
75.0000 mL | Freq: Once | INTRAVENOUS | Status: AC | PRN
  Administered 2023-12-09: 75 mL via INTRAVENOUS

## 2023-12-09 MED ORDER — SODIUM CHLORIDE 0.9 % IV SOLN: INTRAVENOUS | Status: DC

## 2023-12-09 MED ORDER — ASPIRIN 300 MG RE SUPP
300.0000 mg | Freq: Every day | RECTAL | Status: DC
Start: 2023-12-09 — End: 2023-12-10

## 2023-12-09 NOTE — ED Provider Notes (Signed)
 [place] [facility] EMERGENCY DEPARTMENT AT [facility] Provider Note   CSN: [id number] Arrival date & time: 12/09/23  1911     History  Chief Complaint  Patient presents with   slow to respond    Devin Robert [place] Jr. is a 53 y.o. male.  53 year old male with past medical history of coronary artery disease with MI in the past presenting to the emergency department today with expressive aphasia.  The patient's last known normal was 3 PM.  The patient denies any focal weakness, numbness, or tingling.  Denies any headache.  He denies any associated chest pain.  He came in today for further evaluation regarding this.  He reports that since this began at 3 PM that his symptoms have started to improve significantly.  He is still having some mild word finding difficulty.        Home Medications Prior to Admission medications   Medication Sig Start Date End Date Taking? Authorizing Provider  aspirin 81 MG chewable tablet Chew 1 tablet (81 mg total) by mouth daily. 09/15/23   Arty Baumgartner, NP  atorvastatin (LIPITOR) 40 MG tablet Take 1 tablet (40 mg total) by mouth daily. 09/27/23   Sharlene Dory, PA-C  carvedilol (COREG) 6.25 MG tablet Take 1 tablet (6.25 mg total) by mouth 2 (two) times daily with a meal. 09/27/23   Sharlene Dory, PA-C  losartan (COZAAR) 50 MG tablet Take 1 tablet (50 mg total) by mouth daily. 09/27/23   Sharlene Dory, PA-C  nitroGLYCERIN (NITROSTAT) 0.4 MG SL tablet Place 1 tablet (0.4 mg total) under the tongue every 5 (five) minutes as needed for chest pain. 12/20/22   Gaston Islam., NP  PRALUENT 150 MG/ML SOAJ Inject 1 pen under the skin every 14 days. 06/19/22   Lyn Records, MD  spironolactone (ALDACTONE) 25 MG tablet Take 0.5 tablets (12.5 mg total) by mouth daily. 09/27/23   Sharlene Dory, PA-C  ticagrelor (BRILINTA) 90 MG TABS tablet Take 1 tablet (90 mg total) by mouth 2 (two) times daily. 11/23/23   Sharlene Dory, PA-C      Allergies     Patient has no known allergies.    Review of Systems   Review of Systems  Neurological:  Positive for speech difficulty.  All other systems reviewed and are negative.   Physical Exam Updated Vital Signs BP 131/78   Pulse 67   Temp 97.9 F (36.6 C)   Resp 11   Ht 6\' 2"  (1.88 m)   Wt 120.4 kg   SpO2 98%   BMI 34.08 kg/m  Physical Exam Vitals and nursing note reviewed.   Gen: NAD Eyes: PERRL, EOMI HEENT: no oropharyngeal swelling Neck: trachea midline Resp: clear to auscultation bilaterally Card: RRR, no murmurs, rubs, or gallops Abd: nontender, nondistended Extremities: no calf tenderness, no edema Vascular: 2+ radial pulses bilaterally, 2+ DP pulses bilaterally Neuro: NIH stroke scale of 1 for mild expressive aphasia Skin: no rashes Psyc: acting appropriately   ED Results / Procedures / Treatments   Labs (all labs ordered are listed, but only abnormal results are displayed) Labs Reviewed  COMPREHENSIVE METABOLIC PANEL - Abnormal; Notable for the following components:      Result Value   Glucose, Bld 100 (*)    Creatinine, Ser 1.30 (*)    Calcium 8.4 (*)    Total Protein 5.9 (*)    ALT 57 (*)    All other components within normal limits  I-STAT CHEM 8, ED - Abnormal; Notable for the following components:   Creatinine, Ser 1.40 (*)    Calcium, Ion 1.05 (*)    All other components within normal limits  PROTIME-INR  APTT  CBC  DIFFERENTIAL  ETHANOL  HIV ANTIBODY (ROUTINE TESTING W REFLEX)  LIPID PANEL  CBC  CREATININE, SERUM  CBG MONITORING, ED    EKG None  Radiology CT ANGIO HEAD NECK W WO CM (CODE STROKE) Result Date: 12/09/2023 CLINICAL DATA:  Initial evaluation for acute neuro deficit, stroke suspected. EXAM: CT ANGIOGRAPHY HEAD AND NECK WITH AND WITHOUT CONTRAST TECHNIQUE: Multidetector CT imaging of the head and neck was performed using the standard protocol during bolus administration of intravenous contrast. Multiplanar CT image  reconstructions and MIPs were obtained to evaluate the vascular anatomy. Carotid stenosis measurements (when applicable) are obtained utilizing NASCET criteria, using the distal internal carotid diameter as the denominator. RADIATION DOSE REDUCTION: This exam was performed according to the departmental dose-optimization program which includes automated exposure control, adjustment of the mA and/or kV according to patient size and/or use of iterative reconstruction technique. CONTRAST:  75mL OMNIPAQUE IOHEXOL 350 MG/ML SOLN COMPARISON:  Head CT from earlier the same day. FINDINGS: CTA NECK FINDINGS Aortic arch: Visualized aortic arch within normal limits for caliber with standard branch pattern. No stenosis about the origin the great vessels. Mild aortic atherosclerosis. Right carotid system: No evidence of dissection, stenosis (50% or greater), or occlusion. Left carotid system: No evidence of dissection, stenosis (50% or greater), or occlusion. Vertebral arteries: No evidence of dissection, stenosis (50% or greater), or occlusion. Skeleton: No discrete or worrisome osseous lesions. Other neck: No other acute finding. Upper chest: No other acute finding. Review of the MIP images confirms the above findings CTA HEAD FINDINGS Anterior circulation: Atheromatous change about the carotid siphons without hemodynamically significant stenosis. A1 segments, anterior communicating artery complex, and anterior cerebral arteries patent without stenosis. No M1 stenosis or occlusion. Right MCA branches well perfused to their distal aspects. On the left, there is an acute occlusion of a distal M2/M3 branch (series 5, image 102). Remainder of the left MCA branches remain patent. Posterior circulation: Both vertebral arteries are patent to the vertebrobasilar junction without stenosis. Left vertebral artery dominant. Right PICA patent. Left PICA origin not well seen. Basilar patent without stenosis. Superior cerebral arteries patent  bilaterally. Both PCAs primarily supplied via the basilar and are widely patent to their distal aspects. Venous sinuses: Patent allowing for timing the contrast bolus. Anatomic variants: None significant.  No aneurysm. Review of the MIP images confirms the above findings IMPRESSION: 1. Acute occlusion of a distal left M2/M3 branch, corresponding with the evolving left MCA distribution infarcts seen on prior CT. 2. Otherwise wide patency of the major arterial vasculature of the head and neck. No other hemodynamically significant or correctable stenosis. 3.  Aortic Atherosclerosis (ICD10-I70.0). These results were communicated to Dr. Amada Jupiter at 7:56 pm on 12/09/2023 by text page via the [facility] messaging system. Electronically Signed   By: Rise Mu M.D.   On: 12/09/2023 20:11   CT HEAD CODE STROKE WO CONTRAST Result Date: 12/09/2023 CLINICAL DATA:  Code stroke. Initial evaluation for acute neuro deficit, stroke. EXAM: CT HEAD WITHOUT CONTRAST TECHNIQUE: Contiguous axial images were obtained from the base of the skull through the vertex without intravenous contrast. RADIATION DOSE REDUCTION: This exam was performed according to the departmental dose-optimization program which includes automated exposure control, adjustment of the mA and/or kV according to patient  size and/or use of iterative reconstruction technique. COMPARISON:  None Available. FINDINGS: Brain: Cerebral volume within normal limits. Patchy hypodensity seen involving the anterior left insula and overlying left frontal lobe, concerning for evolving acute left MCA distribution infarct. Superimpose chronic infarct noted at this location as well. No acute intracranial hemorrhage. No mass lesion or midline shift. No hydrocephalus or extra-axial fluid collection. Vascular: No abnormal hyperdense vessel. Scattered vascular calcifications noted within the carotid siphons. Skull: Scalp soft tissues and calvarium within normal limits.  Sinuses/Orbits: Globes and orbital soft tissues within normal limits. Paranasal sinuses and mastoid air cells are largely clear. Other: None. ASPECTS [facility] Stroke Program Early CT Score) - Ganglionic level infarction (caudate, lentiform nuclei, internal capsule, insula, M1-M3 cortex): 6 - Supraganglionic infarction (M4-M6 cortex): 2 Total score (0-10 with 10 being normal): 8 IMPRESSION: 1. Findings concerning for acute left MCA distribution infarct involving the anterior left insula and overlying left frontal lobe. No acute intracranial hemorrhage. 2. ASPECTS is 8. 3. Superimposed chronic left MCA territory infarct. These results were communicated to Dr. Amada Jupiter at 7:46 pm on 12/09/2023 by text page via the [facility] messaging system. Electronically Signed   By: Rise Mu M.D.   On: 12/09/2023 19:47    Procedures Procedures    Medications Ordered in ED Medications   stroke: early stages of recovery book (has no administration in time range)  0.9 %  sodium chloride infusion (has no administration in time range)  acetaminophen (TYLENOL) tablet 650 mg (has no administration in time range)    Or  acetaminophen (TYLENOL) 160 MG/5ML solution 650 mg (has no administration in time range)    Or  acetaminophen (TYLENOL) suppository 650 mg (has no administration in time range)  aspirin suppository 300 mg (has no administration in time range)    Or  aspirin tablet 325 mg (has no administration in time range)  enoxaparin (LOVENOX) injection 40 mg (has no administration in time range)  sodium chloride flush (NS) 0.9 % injection 3 mL (3 mLs Intravenous Given 12/09/23 1953)  iohexol (OMNIPAQUE) 350 MG/ML injection 75 mL (75 mLs Intravenous Contrast Given 12/09/23 1953)    ED Course/ Medical Decision Making/ A&P                                 Medical Decision Making 53 year old male with past medical history of coronary artery disease and hyperlipidemia presenting to the emergency department  today with concern for new onset expressive aphasia.  The patient CT angiogram and CT head showed findings concerning for acute infarct.  The patient does have significantly improving symptoms.  I did discuss with Dr. Amada Jupiter.  He will admit the patient to the ICU for close monitoring.  CRITICAL CARE Performed by: Durwin Glaze   Total critical care time: 35 minutes  Critical care time was exclusive of separately billable procedures and treating other patients.  Critical care was necessary to treat or prevent imminent or life-threatening deterioration.  Critical care was time spent personally by me on the following activities: development of treatment plan with patient and/or surrogate as well as nursing, discussions with consultants, evaluation of patient's response to treatment, examination of patient, obtaining history from patient or surrogate, ordering and performing treatments and interventions, ordering and review of laboratory studies, ordering and review of radiographic studies, pulse oximetry and re-evaluation of patient's condition.   Risk Decision regarding hospitalization.  Final Clinical Impression(s) / ED Diagnoses Final diagnoses:  Acute CVA (cerebrovascular accident) [facility])    Rx / DC Orders ED Discharge Orders     None         Durwin Glaze, MD 12/09/23 2038

## 2023-12-09 NOTE — ED Notes (Signed)
 ED TO INPATIENT HANDOFF REPORT  ED Nurse Name and Phone #: Trinna Post RN [phone]  S Name/Age/Gender Devin Hosteller Bur[place] Jr. 53 y.o. male Room/Bed: 035C/035C  Code Status   Code Status: Full Code  Home/SNF/Other Home Patient oriented to: self, place, time, and situation Is this baseline? Yes   Triage Complete: Triage complete  Chief Complaint Acute ischemic stroke [facility]) [I63.9]  Triage Note The pts  wife came home around 1800 after she has been shopping for 2 hours  her grown children told her that her husband had been talking strange  she brought him in  when I asked his name on arrival he gave me his first name and he had to think sbout his last name,  the wife reported that he was last seen normal around 1400 or 1430  when she left to shop.  Nih performed  code stroke called no other symptoms except  dysphagia   1932 stroke was called   dr Amada Jupiter came to triage immediately     Allergies No Known Allergies  Level of Care/Admitting Diagnosis ED Disposition     ED Disposition  Admit   Condition  --   Comment  Hospital Area: [facility] [[id number]]  Level of Care: ICU [6]  May admit patient to [facility] or [facility] if equivalent level of care is available:: No  Covid Evaluation: Asymptomatic - no recent exposure (last 10 days) testing not required  Diagnosis: Acute ischemic stroke [facility]) [[id number]]  Admitting Physician: Rejeana Brock [[id number]]  Attending Physician: Amada Jupiter, MCNEILL P [[id number]]  Certification:: I certify this patient will need inpatient services for at least 2 midnights  Expected Medical Readiness: 12/11/2023          B Medical/Surgery History Past Medical History:  Diagnosis Date   Coronary artery disease    Family history of heart disease    HTN (hypertension)    Hyperlipidemia    Palpitations    STEMI (ST elevation myocardial infarction) (HCC)    PCI/DES to p/mLAD, 60% lcx, 40% ostial LM, and 60% PDA, EF 45%   Past  Surgical History:  Procedure Laterality Date   CORONARY/GRAFT ACUTE MI REVASCULARIZATION N/A 12/21/2018   Procedure: Coronary/Graft Acute MI Revascularization;  Surgeon: Lyn Records, MD;  Location: [facility] INVASIVE CV LAB;  Service: Cardiovascular;  Laterality: N/A;   CORONARY/GRAFT ACUTE MI REVASCULARIZATION N/A 09/14/2023   Procedure: Coronary/Graft Acute MI Revascularization;  Surgeon: Yates Decamp, MD;  Location: [facility] INVASIVE CV LAB;  Service: Cardiovascular;  Laterality: N/A;   LEFT HEART CATH AND CORONARY ANGIOGRAPHY N/A 12/21/2018   Procedure: LEFT HEART CATH AND CORONARY ANGIOGRAPHY;  Surgeon: Lyn Records, MD;  Location: [facility] INVASIVE CV LAB;  Service: Cardiovascular;  Laterality: N/A;     A IV Location/Drains/Wounds Patient Lines/Drains/Airways Status     Active Line/Drains/Airways     Name Placement date Placement time Site Days   Peripheral IV 12/09/23 Left Antecubital 12/09/23  1942  Antecubital  less than 1            Intake/Output Last 24 hours No intake or output data in the 24 hours ending 12/09/23 2039  Labs/Imaging Results for orders placed or performed during the hospital encounter of 12/09/23 (from the past 48 hours)  Protime-INR     Status: None   Collection Time: 12/09/23  7:32 PM  Result Value Ref Range   Prothrombin Time 13.9 11.4 - 15.2 seconds   INR 1.1 0.8 - 1.2  Comment: (NOTE) INR goal varies based on device and disease states. Performed at [facility], [address] [place], [place] [address]   APTT     Status: None   Collection Time: 12/09/23  7:32 PM  Result Value Ref Range   aPTT 27 24 - 36 seconds    Comment: Performed at [facility], [address], [place], [place] [address]  CBC     Status: None   Collection Time: 12/09/23  7:32 PM  Result Value Ref Range   WBC 7.4 4.0 - 10.5 K/uL   RBC 5.49 4.22 - 5.81 MIL/uL   Hemoglobin 15.9 13.0 - 17.0 g/dL   HCT 09.8 11.9 - 14.7 %   MCV 84.2 80.0 - 100.0 fL   MCH 29.0 26.0 -  34.0 pg   MCHC 34.4 30.0 - 36.0 g/dL   RDW 82.9 56.2 - 13.0 %   Platelets 268 150 - 400 K/uL   nRBC 0.0 0.0 - 0.2 %    Comment: Performed at [facility], [address] [place], [place] [address]  Differential     Status: None   Collection Time: 12/09/23  7:32 PM  Result Value Ref Range   Neutrophils Relative % 71 %   Neutro Abs 5.3 1.7 - 7.7 K/uL   Lymphocytes Relative 17 %   Lymphs Abs 1.3 0.7 - 4.0 K/uL   Monocytes Relative 7 %   Monocytes Absolute 0.5 0.1 - 1.0 K/uL   Eosinophils Relative 3 %   Eosinophils Absolute 0.2 0.0 - 0.5 K/uL   Basophils Relative 1 %   Basophils Absolute 0.1 0.0 - 0.1 K/uL   Immature Granulocytes 1 %   Abs Immature Granulocytes 0.04 0.00 - 0.07 K/uL    Comment: Performed at [facility], [address] [place], [place] [address]  Comprehensive metabolic panel     Status: Abnormal   Collection Time: 12/09/23  7:32 PM  Result Value Ref Range   Sodium 138 135 - 145 mmol/L   Potassium 4.0 3.5 - 5.1 mmol/L   Chloride 109 98 - 111 mmol/L   CO2 24 22 - 32 mmol/L   Glucose, Bld 100 (H) 70 - 99 mg/dL    Comment: Glucose reference range applies only to samples taken after fasting for at least 8 hours.   BUN 14 6 - 20 mg/dL   Creatinine, Ser 9.52 (H) 0.61 - 1.24 mg/dL   Calcium 8.4 (L) 8.9 - 10.3 mg/dL   Total Protein 5.9 (L) 6.5 - 8.1 g/dL   Albumin 3.6 3.5 - 5.0 g/dL   AST 35 15 - 41 U/L   ALT 57 (H) 0 - 44 U/L   Alkaline Phosphatase 68 38 - 126 U/L   Total Bilirubin 0.6 0.0 - 1.2 mg/dL   GFR, Estimated >84 >13 mL/min    Comment: (NOTE) Calculated using the CKD-EPI Creatinine Equation (2021)    Anion gap 5 5 - 15    Comment: Performed at [facility], [address] [place], [place] [address]  Ethanol     Status: None   Collection Time: 12/09/23  7:32 PM  Result Value Ref Range   Alcohol, Ethyl (B) <10 <10 mg/dL    Comment: (NOTE) Lowest detectable limit for serum alcohol is 10 mg/dL.  For medical purposes only. Performed  at [facility], [address], [place], [place] [address]   I-stat chem 8, ED  Status: Abnormal   Collection Time: 12/09/23  7:32 PM  Result Value Ref Range   Sodium 141 135 - 145 mmol/L   Potassium 4.0 3.5 - 5.1 mmol/L   Chloride 106 98 - 111 mmol/L   BUN 16 6 - 20 mg/dL   Creatinine, Ser 1.47 (H) 0.61 - 1.24 mg/dL   Glucose, Bld 94 70 - 99 mg/dL    Comment: Glucose reference range applies only to samples taken after fasting for at least 8 hours.   Calcium, Ion 1.05 (L) 1.15 - 1.40 mmol/L   TCO2 24 22 - 32 mmol/L   Hemoglobin 15.3 13.0 - 17.0 g/dL   HCT 82.9 56.2 - 13.0 %  CBG monitoring, ED     Status: None   Collection Time: 12/09/23  7:34 PM  Result Value Ref Range   Glucose-Capillary 99 70 - 99 mg/dL    Comment: Glucose reference range applies only to samples taken after fasting for at least 8 hours.   CT ANGIO HEAD NECK W WO CM (CODE STROKE) Result Date: 12/09/2023 CLINICAL DATA:  Initial evaluation for acute neuro deficit, stroke suspected. EXAM: CT ANGIOGRAPHY HEAD AND NECK WITH AND WITHOUT CONTRAST TECHNIQUE: Multidetector CT imaging of the head and neck was performed using the standard protocol during bolus administration of intravenous contrast. Multiplanar CT image reconstructions and MIPs were obtained to evaluate the vascular anatomy. Carotid stenosis measurements (when applicable) are obtained utilizing NASCET criteria, using the distal internal carotid diameter as the denominator. RADIATION DOSE REDUCTION: This exam was performed according to the departmental dose-optimization program which includes automated exposure control, adjustment of the mA and/or kV according to patient size and/or use of iterative reconstruction technique. CONTRAST:  75mL OMNIPAQUE IOHEXOL 350 MG/ML SOLN COMPARISON:  Head CT from earlier the same day. FINDINGS: CTA NECK FINDINGS Aortic arch: Visualized aortic arch within normal limits for caliber with standard branch pattern. No stenosis  about the origin the great vessels. Mild aortic atherosclerosis. Right carotid system: No evidence of dissection, stenosis (50% or greater), or occlusion. Left carotid system: No evidence of dissection, stenosis (50% or greater), or occlusion. Vertebral arteries: No evidence of dissection, stenosis (50% or greater), or occlusion. Skeleton: No discrete or worrisome osseous lesions. Other neck: No other acute finding. Upper chest: No other acute finding. Review of the MIP images confirms the above findings CTA HEAD FINDINGS Anterior circulation: Atheromatous change about the carotid siphons without hemodynamically significant stenosis. A1 segments, anterior communicating artery complex, and anterior cerebral arteries patent without stenosis. No M1 stenosis or occlusion. Right MCA branches well perfused to their distal aspects. On the left, there is an acute occlusion of a distal M2/M3 branch (series 5, image 102). Remainder of the left MCA branches remain patent. Posterior circulation: Both vertebral arteries are patent to the vertebrobasilar junction without stenosis. Left vertebral artery dominant. Right PICA patent. Left PICA origin not well seen. Basilar patent without stenosis. Superior cerebral arteries patent bilaterally. Both PCAs primarily supplied via the basilar and are widely patent to their distal aspects. Venous sinuses: Patent allowing for timing the contrast bolus. Anatomic variants: None significant.  No aneurysm. Review of the MIP images confirms the above findings IMPRESSION: 1. Acute occlusion of a distal left M2/M3 branch, corresponding with the evolving left MCA distribution infarcts seen on prior CT. 2. Otherwise wide patency of the major arterial vasculature of the head and neck. No other hemodynamically significant or correctable stenosis. 3.  Aortic Atherosclerosis (ICD10-I70.0). These results were communicated to Dr.  Kirkpatrick at 7:56 pm on 12/09/2023 by text page via the [facility] messaging  system. Electronically Signed   By: Rise Mu M.D.   On: 12/09/2023 20:11   CT HEAD CODE STROKE WO CONTRAST Result Date: 12/09/2023 CLINICAL DATA:  Code stroke. Initial evaluation for acute neuro deficit, stroke. EXAM: CT HEAD WITHOUT CONTRAST TECHNIQUE: Contiguous axial images were obtained from the base of the skull through the vertex without intravenous contrast. RADIATION DOSE REDUCTION: This exam was performed according to the departmental dose-optimization program which includes automated exposure control, adjustment of the mA and/or kV according to patient size and/or use of iterative reconstruction technique. COMPARISON:  None Available. FINDINGS: Brain: Cerebral volume within normal limits. Patchy hypodensity seen involving the anterior left insula and overlying left frontal lobe, concerning for evolving acute left MCA distribution infarct. Superimpose chronic infarct noted at this location as well. No acute intracranial hemorrhage. No mass lesion or midline shift. No hydrocephalus or extra-axial fluid collection. Vascular: No abnormal hyperdense vessel. Scattered vascular calcifications noted within the carotid siphons. Skull: Scalp soft tissues and calvarium within normal limits. Sinuses/Orbits: Globes and orbital soft tissues within normal limits. Paranasal sinuses and mastoid air cells are largely clear. Other: None. ASPECTS [facility] Stroke Program Early CT Score) - Ganglionic level infarction (caudate, lentiform nuclei, internal capsule, insula, M1-M3 cortex): 6 - Supraganglionic infarction (M4-M6 cortex): 2 Total score (0-10 with 10 being normal): 8 IMPRESSION: 1. Findings concerning for acute left MCA distribution infarct involving the anterior left insula and overlying left frontal lobe. No acute intracranial hemorrhage. 2. ASPECTS is 8. 3. Superimposed chronic left MCA territory infarct. These results were communicated to Dr. Amada Jupiter at 7:46 pm on 12/09/2023 by text page via the  [facility] messaging system. Electronically Signed   By: Rise Mu M.D.   On: 12/09/2023 19:47    Pending Labs Unresulted Labs (From admission, onward)     Start     Ordered   12/16/23 0500  Creatinine, serum  (enoxaparin (LOVENOX)    CrCl >/= 30 ml/min)  Weekly,   R     Comments: while on enoxaparin therapy    12/09/23 2029   12/10/23 0500  Lipid panel  (Labs)  Tomorrow morning,   R       Comments: Fasting    12/09/23 2029   12/09/23 2029  CBC  (enoxaparin (LOVENOX)    CrCl >/= 30 ml/min)  Once,   R       Comments: Baseline for enoxaparin therapy IF NOT ALREADY DRAWN.  Notify MD if PLT < 100 K.    12/09/23 2029   12/09/23 2029  Creatinine, serum  (enoxaparin (LOVENOX)    CrCl >/= 30 ml/min)  Once,   R       Comments: Baseline for enoxaparin therapy IF NOT ALREADY DRAWN.    12/09/23 2029   12/09/23 2028  HIV Antibody (routine testing w rflx)  (HIV Antibody (Routine testing w reflex) panel)  Once,   R        12/09/23 2029            Vitals/Pain Today's Vitals   12/09/23 1956 12/09/23 1956 12/09/23 1957 12/09/23 2000  BP:   131/78   Pulse:    67  Resp:   14 11  Temp:      SpO2:    98%  Weight: 120.2 kg  120.4 kg   Height: 6\' 2"  (1.88 m)  6\' 2"  (1.88 m)   PainSc:  0-No pain  Isolation Precautions No active isolations  Medications Medications   stroke: early stages of recovery book (has no administration in time range)  0.9 %  sodium chloride infusion (has no administration in time range)  acetaminophen (TYLENOL) tablet 650 mg (has no administration in time range)    Or  acetaminophen (TYLENOL) 160 MG/5ML solution 650 mg (has no administration in time range)    Or  acetaminophen (TYLENOL) suppository 650 mg (has no administration in time range)  aspirin suppository 300 mg (has no administration in time range)    Or  aspirin tablet 325 mg (has no administration in time range)  enoxaparin (LOVENOX) injection 40 mg (has no administration in time range)   sodium chloride flush (NS) 0.9 % injection 3 mL (3 mLs Intravenous Given 12/09/23 1953)  iohexol (OMNIPAQUE) 350 MG/ML injection 75 mL (75 mLs Intravenous Contrast Given 12/09/23 1953)    Mobility walks     Focused Assessments     R Recommendations: See Admitting Provider Note  Report given to:   Additional Notes: Slight Aphasia

## 2023-12-09 NOTE — ED Triage Notes (Signed)
 The pts  wife came home around 1800 after she has been shopping for 2 hours  her grown children told her that her husband had been talking strange  she brought him in  when I asked his name on arrival he gave me his first name and he had to think sbout his last name,  the wife reported that he was last seen normal around 1400 or 1430  when she left to shop.  Nih performed  code stroke called no other symptoms except  dysphagia   1932 stroke was called   dr Amada Jupiter came to triage immediately

## 2023-12-10 ENCOUNTER — Inpatient Hospital Stay (HOSPITAL_COMMUNITY): Payer: BC Managed Care – PPO

## 2023-12-10 ENCOUNTER — Encounter (HOSPITAL_COMMUNITY)
Admission: RE | Admit: 2023-12-10 | Discharge: 2023-12-10 | Disposition: A | Discharge status: Home / Self Care | Payer: BC Managed Care – PPO | Source: Ambulatory Visit | Attending: Cardiology | Admitting: Cardiology

## 2023-12-10 DIAGNOSIS — I6389 Other cerebral infarction: Secondary | ICD-10-CM | POA: Diagnosis not present

## 2023-12-10 DIAGNOSIS — I639 Cerebral infarction, unspecified: Secondary | ICD-10-CM | POA: Diagnosis not present

## 2023-12-10 DIAGNOSIS — I6340 Cerebral infarction due to embolism of unspecified cerebral artery: Secondary | ICD-10-CM

## 2023-12-10 DIAGNOSIS — Z5189 Encounter for other specified aftercare: Secondary | ICD-10-CM | POA: Insufficient documentation

## 2023-12-10 DIAGNOSIS — Z955 Presence of coronary angioplasty implant and graft: Secondary | ICD-10-CM | POA: Insufficient documentation

## 2023-12-10 DIAGNOSIS — I252 Old myocardial infarction: Secondary | ICD-10-CM | POA: Insufficient documentation

## 2023-12-10 LAB — ECHOCARDIOGRAM COMPLETE
AR max vel: 3.12 cm2
AV Area VTI: 2.67 cm2
AV Area mean vel: 2.62 cm2
AV Mean grad: 3 mm[Hg]
AV Peak grad: 5 mm[Hg]
Ao pk vel: 1.12 m/s
Area-P 1/2: 2.85 cm2
Calc EF: 53.5 %
Height: 74 in
MV VTI: 3.31 cm2
S' Lateral: 4 cm
Single Plane A2C EF: 58.4 %
Single Plane A4C EF: 47.4 %
Weight: 4246.94 [oz_av]

## 2023-12-10 LAB — RAPID URINE DRUG SCREEN, HOSP PERFORMED
Amphetamines: NOT DETECTED
Barbiturates: NOT DETECTED
Benzodiazepines: NOT DETECTED
Cocaine: NOT DETECTED
Opiates: NOT DETECTED
Tetrahydrocannabinol: POSITIVE — AB

## 2023-12-10 LAB — HIV ANTIBODY (ROUTINE TESTING W REFLEX): HIV Screen 4th Generation wRfx: NONREACTIVE

## 2023-12-10 MED ORDER — ASPIRIN 81 MG PO TBEC
81.0000 mg | DELAYED_RELEASE_TABLET | Freq: Every day | ORAL | Status: DC
  Administered 2023-12-10 – 2023-12-11 (×2): 81 mg via ORAL
  Filled 2023-12-10 (×2): qty 1

## 2023-12-10 MED ORDER — ATORVASTATIN CALCIUM 40 MG PO TABS
40.0000 mg | ORAL_TABLET | Freq: Every day | ORAL | Status: DC
  Administered 2023-12-10 – 2023-12-11 (×2): 40 mg via ORAL
  Filled 2023-12-10 (×2): qty 1

## 2023-12-10 MED ORDER — CLOPIDOGREL BISULFATE 300 MG PO TABS
300.0000 mg | ORAL_TABLET | Freq: Once | ORAL | Status: DC
  Filled 2023-12-10: qty 1

## 2023-12-10 MED ORDER — TICAGRELOR 90 MG PO TABS
90.0000 mg | ORAL_TABLET | Freq: Two times a day (BID) | ORAL | Status: DC
  Administered 2023-12-10 – 2023-12-11 (×4): 90 mg via ORAL
  Filled 2023-12-10 (×4): qty 1

## 2023-12-10 MED ORDER — SODIUM CHLORIDE 0.9% FLUSH: 3.0000 mL | INTRAVENOUS | Status: DC | PRN

## 2023-12-10 MED ORDER — PERFLUTREN LIPID MICROSPHERE
1.0000 mL | INTRAVENOUS | Status: AC | PRN
  Administered 2023-12-10: 2 mL via INTRAVENOUS

## 2023-12-10 MED ORDER — SODIUM CHLORIDE 0.9% FLUSH
3.0000 mL | Freq: Two times a day (BID) | INTRAVENOUS | Status: DC
  Administered 2023-12-10: 3 mL via INTRAVENOUS

## 2023-12-10 MED ORDER — CLOPIDOGREL BISULFATE 75 MG PO TABS: 75.0000 mg | ORAL_TABLET | Freq: Every day | ORAL | Status: DC

## 2023-12-10 NOTE — Progress Notes (Signed)
 OT Cancellation Note  Patient Details Name: Devin Robert Bur[place] Jr. MRN: [id number] DOB: [birth year]   Cancelled Treatment:    Reason Eval/Treat Not Completed: OT screened, no needs identified, will sign off. Per pt, pt moving well as OT walks into room and pt actively performing LB ADL without assist. PT reports cognition is [facility]. Please re-consult if change in status.   Tyler Deis, OTR/L [facility] Acute Rehabilitation Office: [phone]   Myrla Halsted 12/10/2023, 10:43 AM

## 2023-12-10 NOTE — Progress Notes (Signed)
 Bilateral lower extremity venous duplex has been completed. Preliminary results can be found in CV Proc through chart review.   12/10/23 2:43 PM Olen Cordial RVT

## 2023-12-10 NOTE — Progress Notes (Addendum)
 STROKE TEAM PROGRESS NOTE    SIGNIFICANT HOSPITAL EVENTS 12/16 admitted for difficulty speaking to ICU for close watch. CTA with distal M2   INTERIM HISTORY/SUBJECTIVE  Wife is at the bedside.  Patient is sitting in the chair no apparent distress NIH is 0 but does occasionally still have some word finding/hesitancy with speech.  Continues aspirin and Brilinta TEE scheduled for tomorrow, and will need loop prior to discharge Will check TCD with bubble and ultrasound of lower extremities  OBJECTIVE  CBC    Component Value Date/Time   WBC 8.9 12/09/2023 2306   RBC 5.87 (H) 12/09/2023 2306   HGB 16.7 12/09/2023 2306   HGB 17.7 01/06/2019 1313   HCT 49.1 12/09/2023 2306   HCT 51.7 (H) 01/06/2019 1313   PLT 282 12/09/2023 2306   PLT 317 01/06/2019 1313   MCV 83.6 12/09/2023 2306   MCV 86 01/06/2019 1313   MCH 28.4 12/09/2023 2306   MCHC 34.0 12/09/2023 2306   RDW 11.9 12/09/2023 2306   RDW 12.7 01/06/2019 1313   LYMPHSABS 1.3 12/09/2023 1932   MONOABS 0.5 12/09/2023 1932   EOSABS 0.2 12/09/2023 1932   BASOSABS 0.1 12/09/2023 1932    BMET    Component Value Date/Time   NA 138 12/09/2023 1932   NA 141 12/09/2023 1932   NA 141 10/19/2022 0929   K 4.0 12/09/2023 1932   K 4.0 12/09/2023 1932   CL 109 12/09/2023 1932   CL 106 12/09/2023 1932   CO2 24 12/09/2023 1932   GLUCOSE 100 (H) 12/09/2023 1932   GLUCOSE 94 12/09/2023 1932   BUN 14 12/09/2023 1932   BUN 16 12/09/2023 1932   BUN 16 10/19/2022 0929   CREATININE 1.27 (H) 12/09/2023 2306   CALCIUM 8.4 (L) 12/09/2023 1932   EGFR 81 10/19/2022 0929   GFRNONAA >60 12/09/2023 2306    IMAGING past 24 hours ECHOCARDIOGRAM COMPLETE Result Date: 12/10/2023    ECHOCARDIOGRAM REPORT   Patient Name:   Devin Robert Burundi Jr. Date of Exam: 12/10/2023 Medical Rec #:  [id number]               Height:       74.0 in Accession #:    [id number]              Weight:       265.4 lb Date of Birth:  Jun 26, 1971                BSA:           2.451 m Patient Age:    53 years                BP:           117/73 mmHg Patient Gender: M                       HR:           64 bpm. Exam Location:  Inpatient Procedure: 2D Echo, Cardiac Doppler, Color Doppler and Intracardiac            Opacification Agent (Both Spectral and Color Flow Doppler were            utilized during procedure). Indications:    Stroke  History:        Patient has prior history of Echocardiogram examinations, most                 recent 09/15/2023. Cardiomyopathy and  CHF, Previous Myocardial                 Infarction; Risk Factors:Hypertension.  Sonographer:    Amy Chionchio Referring Phys: [id number] MCNEILL P KIRKPATRICK IMPRESSIONS  1. Left ventricular ejection fraction, by estimation, is 45 to 50%. The left ventricle has mildly decreased function. The left ventricle demonstrates regional wall motion abnormalities (see scoring diagram/findings for description). There is mild left ventricular hypertrophy of the basal-septal segment. Left ventricular diastolic parameters are indeterminate. There is akinesis of the left ventricular, apical segment. There is hypokinesis of the left ventricular, apical septal wall, inferior wall and anterior wall.  2. Right ventricular systolic function is normal. The right ventricular size is mildly enlarged. Tricuspid regurgitation signal is inadequate for assessing PA pressure.  3. The mitral valve is normal in structure. Trivial mitral valve regurgitation. No evidence of mitral stenosis. The mean mitral valve gradient is 1.0 mmHg.  4. The aortic valve is tricuspid. Aortic valve regurgitation is not visualized. Aortic valve sclerosis/calcification is present, without any evidence of aortic stenosis. Aortic valve area, by VTI measures 2.67 cm. Aortic valve mean gradient measures 3.0 mmHg. Aortic valve Vmax measures 1.12 m/s.  5. The inferior vena cava is normal in size with greater than 50% respiratory variability, suggesting right atrial pressure of 3 mmHg.  Conclusion(s)/Recommendation(s): No intracardiac source of embolism detected on this transthoracic study. Consider a transesophageal echocardiogram to exclude cardiac source of embolism if clinically indicated. FINDINGS  Left Ventricle: Left ventricular ejection fraction, by estimation, is 45 to 50%. The left ventricle has mildly decreased function. The left ventricle demonstrates regional wall motion abnormalities. Strain imaging was not performed. The left ventricular  internal cavity size was normal in size. There is mild left ventricular hypertrophy of the basal-septal segment. Left ventricular diastolic parameters are indeterminate. Normal left ventricular filling pressure. Right Ventricle: The right ventricular size is mildly enlarged. No increase in right ventricular wall thickness. Right ventricular systolic function is normal. Tricuspid regurgitation signal is inadequate for assessing PA pressure. Left Atrium: Left atrial size was normal in size. Right Atrium: Right atrial size was normal in size. Pericardium: There is no evidence of pericardial effusion. Mitral Valve: The mitral valve is normal in structure. Trivial mitral valve regurgitation. No evidence of mitral valve stenosis. MV peak gradient, 1.8 mmHg. The mean mitral valve gradient is 1.0 mmHg. Tricuspid Valve: The tricuspid valve is normal in structure. Tricuspid valve regurgitation is not demonstrated. No evidence of tricuspid stenosis. Aortic Valve: The aortic valve is tricuspid. Aortic valve regurgitation is not visualized. Aortic valve sclerosis/calcification is present, without any evidence of aortic stenosis. Aortic valve mean gradient measures 3.0 mmHg. Aortic valve peak gradient measures 5.0 mmHg. Aortic valve area, by VTI measures 2.67 cm. Pulmonic Valve: The pulmonic valve was normal in structure. Pulmonic valve regurgitation is not visualized. No evidence of pulmonic stenosis. Aorta: The aortic root is normal in size and structure.  Venous: The inferior vena cava is normal in size with greater than 50% respiratory variability, suggesting right atrial pressure of 3 mmHg. IAS/Shunts: No atrial level shunt detected by color flow Doppler. Additional Comments: 3D imaging was not performed.  LEFT VENTRICLE PLAX 2D LVIDd:         5.30 cm      Diastology LVIDs:         4.00 cm      LV e' medial:    7.51 cm/s LV PW:         1.10 cm  LV E/e' medial:  8.0 LV IVS:        1.20 cm      LV e' lateral:   13.50 cm/s LVOT diam:     2.20 cm      LV E/e' lateral: 4.4 LV SV:         66 LV SV Index:   27 LVOT Area:     3.80 cm  LV Volumes (MOD) LV vol d, MOD A2C: 203.0 ml LV vol d, MOD A4C: 159.0 ml LV vol s, MOD A2C: 84.5 ml LV vol s, MOD A4C: 83.7 ml LV SV MOD A2C:     118.5 ml LV SV MOD A4C:     159.0 ml LV SV MOD BP:      96.1 ml RIGHT VENTRICLE             IVC RV Basal diam:  4.30 cm     IVC diam: 1.80 cm RV Mid diam:    4.10 cm RV S prime:     12.70 cm/s TAPSE (M-mode): 2.4 cm LEFT ATRIUM             Index        RIGHT ATRIUM           Index LA Vol (A2C):   72.3 ml 29.49 ml/m  RA Area:     21.40 cm LA Vol (A4C):   42.7 ml 17.42 ml/m  RA Volume:   68.30 ml  27.86 ml/m LA Biplane Vol: 56.9 ml 23.21 ml/m  AORTIC VALVE                    PULMONIC VALVE AV Area (Vmax):    3.12 cm     PV Vmax:       1.01 m/s AV Area (Vmean):   2.62 cm     PV Peak grad:  4.1 mmHg AV Area (VTI):     2.67 cm AV Vmax:           112.00 cm/s AV Vmean:          84.900 cm/s AV VTI:            0.248 m AV Peak Grad:      5.0 mmHg AV Mean Grad:      3.0 mmHg LVOT Vmax:         91.90 cm/s LVOT Vmean:        58.600 cm/s LVOT VTI:          0.174 m LVOT/AV VTI ratio: 0.70  AORTA Ao Root diam: 3.50 cm Ao Asc diam:  3.50 cm MITRAL VALVE MV Area (PHT): 2.85 cm    SHUNTS MV Area VTI:   3.31 cm    Systemic VTI:  0.17 m MV Peak grad:  1.8 mmHg    Systemic Diam: 2.20 cm MV Mean grad:  1.0 mmHg MV Vmax:       0.68 m/s MV Vmean:      41.1 cm/s MV Decel Time: 266 msec MV E velocity: 59.90  cm/s MV A velocity: 43.10 cm/s MV E/A ratio:  1.39 Armanda Magic MD Electronically signed by Armanda Magic MD Signature Date/Time: 12/10/2023/11:37:03 AM    Final    MR BRAIN WO CONTRAST Result Date: 12/10/2023 CLINICAL DATA:  Follow-up examination for acute stroke. EXAM: MRI HEAD WITHOUT CONTRAST TECHNIQUE: Multiplanar, multiecho pulse sequences of the brain and surrounding structures were obtained without intravenous contrast. COMPARISON:  CTs from earlier the same day. FINDINGS: Brain:  Cerebral volume within normal limits. No significant cerebral white matter disease. Abnormal restricted diffusion involving the anterior left insular cortex and overlying left frontal operculum, consistent with an acute left MCA distribution infarct (series 6, images 62-75). Mild petechial blood products at this location without hemorrhagic transformation or significant regional mass effect (series 12, image 42). Small underlying cystic focus within this region suspected to reflect a superimposed chronic right MCA distribution infarct (series 15, image 23). No other evidence for acute or subacute ischemia. No other acute or chronic intracranial blood products. No mass lesion, midline shift or mass effect. No hydrocephalus or extra-axial fluid collection. Pituitary gland and suprasellar region within normal limits. Vascular: Major intracranial vascular flow voids are maintained. Skull and upper cervical spine: Craniocervical junction within normal limits. Bone marrow signal intensity normal. No scalp soft tissue abnormality. Sinuses/Orbits: Globes orbital soft tissues within normal limits. Mild scattered mucosal thickening noted about the frontoethmoidal sinuses. Trace right mastoid effusion noted, of doubtful significance. Other: None. IMPRESSION: 1. Acute left MCA distribution infarct involving the anterior left insular cortex and overlying left frontal operculum. Mild petechial blood products at this location without hemorrhagic  transformation or significant regional mass effect. 2. Superimposed small chronic infarct involving the underlying left frontal operculum. 3. Otherwise normal brain MRI. Electronically Signed   By: Rise Mu M.D.   On: 12/10/2023 05:58   CT ANGIO HEAD NECK W WO CM (CODE STROKE) Result Date: 12/09/2023 CLINICAL DATA:  Initial evaluation for acute neuro deficit, stroke suspected. EXAM: CT ANGIOGRAPHY HEAD AND NECK WITH AND WITHOUT CONTRAST TECHNIQUE: Multidetector CT imaging of the head and neck was performed using the standard protocol during bolus administration of intravenous contrast. Multiplanar CT image reconstructions and MIPs were obtained to evaluate the vascular anatomy. Carotid stenosis measurements (when applicable) are obtained utilizing NASCET criteria, using the distal internal carotid diameter as the denominator. RADIATION DOSE REDUCTION: This exam was performed according to the departmental dose-optimization program which includes automated exposure control, adjustment of the mA and/or kV according to patient size and/or use of iterative reconstruction technique. CONTRAST:  75mL OMNIPAQUE IOHEXOL 350 MG/ML SOLN COMPARISON:  Head CT from earlier the same day. FINDINGS: CTA NECK FINDINGS Aortic arch: Visualized aortic arch within normal limits for caliber with standard branch pattern. No stenosis about the origin the great vessels. Mild aortic atherosclerosis. Right carotid system: No evidence of dissection, stenosis (50% or greater), or occlusion. Left carotid system: No evidence of dissection, stenosis (50% or greater), or occlusion. Vertebral arteries: No evidence of dissection, stenosis (50% or greater), or occlusion. Skeleton: No discrete or worrisome osseous lesions. Other neck: No other acute finding. Upper chest: No other acute finding. Review of the MIP images confirms the above findings CTA HEAD FINDINGS Anterior circulation: Atheromatous change about the carotid siphons without  hemodynamically significant stenosis. A1 segments, anterior communicating artery complex, and anterior cerebral arteries patent without stenosis. No M1 stenosis or occlusion. Right MCA branches well perfused to their distal aspects. On the left, there is an acute occlusion of a distal M2/M3 branch (series 5, image 102). Remainder of the left MCA branches remain patent. Posterior circulation: Both vertebral arteries are patent to the vertebrobasilar junction without stenosis. Left vertebral artery dominant. Right PICA patent. Left PICA origin not well seen. Basilar patent without stenosis. Superior cerebral arteries patent bilaterally. Both PCAs primarily supplied via the basilar and are widely patent to their distal aspects. Venous sinuses: Patent allowing for timing the contrast bolus. Anatomic variants: None significant.  No aneurysm. Review of the MIP images confirms the above findings IMPRESSION: 1. Acute occlusion of a distal left M2/M3 branch, corresponding with the evolving left MCA distribution infarcts seen on prior CT. 2. Otherwise wide patency of the major arterial vasculature of the head and neck. No other hemodynamically significant or correctable stenosis. 3.  Aortic Atherosclerosis (ICD10-I70.0). These results were communicated to Dr. Amada Jupiter at 7:56 pm on 12/09/2023 by text page via the [facility] messaging system. Electronically Signed   By: Rise Mu M.D.   On: 12/09/2023 20:11   CT HEAD CODE STROKE WO CONTRAST Result Date: 12/09/2023 CLINICAL DATA:  Code stroke. Initial evaluation for acute neuro deficit, stroke. EXAM: CT HEAD WITHOUT CONTRAST TECHNIQUE: Contiguous axial images were obtained from the base of the skull through the vertex without intravenous contrast. RADIATION DOSE REDUCTION: This exam was performed according to the departmental dose-optimization program which includes automated exposure control, adjustment of the mA and/or kV according to patient size and/or use of  iterative reconstruction technique. COMPARISON:  None Available. FINDINGS: Brain: Cerebral volume within normal limits. Patchy hypodensity seen involving the anterior left insula and overlying left frontal lobe, concerning for evolving acute left MCA distribution infarct. Superimpose chronic infarct noted at this location as well. No acute intracranial hemorrhage. No mass lesion or midline shift. No hydrocephalus or extra-axial fluid collection. Vascular: No abnormal hyperdense vessel. Scattered vascular calcifications noted within the carotid siphons. Skull: Scalp soft tissues and calvarium within normal limits. Sinuses/Orbits: Globes and orbital soft tissues within normal limits. Paranasal sinuses and mastoid air cells are largely clear. Other: None. ASPECTS [facility] Stroke Program Early CT Score) - Ganglionic level infarction (caudate, lentiform nuclei, internal capsule, insula, M1-M3 cortex): 6 - Supraganglionic infarction (M4-M6 cortex): 2 Total score (0-10 with 10 being normal): 8 IMPRESSION: 1. Findings concerning for acute left MCA distribution infarct involving the anterior left insula and overlying left frontal lobe. No acute intracranial hemorrhage. 2. ASPECTS is 8. 3. Superimposed chronic left MCA territory infarct. These results were communicated to Dr. Amada Jupiter at 7:46 pm on 12/09/2023 by text page via the [facility] messaging system. Electronically Signed   By: Rise Mu M.D.   On: 12/09/2023 19:47    Vitals:   12/10/23 0900 12/10/23 0954 12/10/23 1200 12/10/23 1300  BP: 118/65 128/88 125/71 122/70  Pulse: 64 76 63 71  Resp: 20 13    Temp:      TempSrc:      SpO2: 96% 99% 99% 98%  Weight:      Height:         PHYSICAL EXAM General:  Alert, well-nourished, well-developed patient in no acute distress Psych:  Mood and affect appropriate for situation CV: Regular rate and rhythm on monitor Respiratory:  Regular, unlabored respirations on room air GI: Abdomen soft and  nontender   NEURO:  Mental Status: AA&Ox3, patient is able to give clear and coherent history Speech/Language: speech is without dysarthria or aphasia.  Naming, repetition, fluency, and comprehension intact.  Cranial Nerves:  II: PERRL. Visual fields full.  III, IV, VI: EOMI. Eyelids elevate symmetrically.  V: Sensation is intact to light touch and symmetrical to face.  VII: Face is symmetrical resting and smiling VIII: hearing intact to voice. IX, X: Palate elevates symmetrically. Phonation is normal.  [id number] shrug 5/5. XII: tongue is midline without fasciculations. Motor: 5/5 strength to all muscle groups tested.  Tone: is normal and bulk is normal Sensation- Intact to light touch bilaterally. Extinction absent to light touch  to DSS.   Coordination: FTN intact bilaterally, HKS: no ataxia in BLE.No drift.  Gait- deferred  Most Recent NIH  1a Level of Conscious.: 0 1b LOC Questions: 0 1c LOC Commands: 0 2 Best Gaze: 0 3 Visual: 0 4 Facial Palsy: 0 5a Motor Arm - left: 0 5b Motor Arm - Right: 0 6a Motor Leg - Left: 0 6b Motor Leg - Right: 0 7 Limb Ataxia: 0 8 Sensory: 0 9 Best Language: 0 10 Dysarthria: 0 11 Extinct. and Inatten.: 0 TOTAL: 0   ASSESSMENT/PLAN  Mr. Devin Robert Burundi Jr. is a 53 y.o. male with history of  hypertension, hyperlipidemia, STEMI with PCI to his LAD now on Brilinta and aspirin  admitted for abrupt onset of difficulty speaking this afternoon. Marland Kitchen  NIH on Admission 1  Stroke:  left insular cortex and left frontal operculum patchy infarcts, etiology: Likely cardioembolic source Code Stroke  CT head  concerning for acute left MCA distribution infarct involving the anterior left insula and overlying left frontal lobe.  Aspects 8 CTA head & neck Acute occlusion of a distal left M2/M3 branch  MRI  Acute left MCA distribution infarct involving the anterior left insular cortex and overlying left frontal operculum. Mild petechial blood products at  this location LE Doppler no DVT 2D Echo EF 45 to 50%.  LVH.  Right ventricle mildly enlarged TCD with bubble pending TEE scheduled for tomorrow Will consider loop recorder if above workup unrevealing LDL 64 HgbA1c 5.0 UDS positive for THC VTE prophylaxis -Lovenox aspirin 81 mg daily and Brilinta (ticagrelor) 90 mg bid prior to admission, now on aspirin 81 mg daily and Brilinta (ticagrelor) 90 mg bid Therapy recommendations:  No follow up needed  Disposition: Pending  History of STEMI x 2 CAD Ischemic cardiomyopathy STEMI with stent in 11/2020, was on aspirin and Brilinta for a year STEMI with stent in 08/2023, seen by Dr. Jacinto Halim, put on aspirin and Brilinta EF 60 to 65% in 08/2023 Current admission EF 45 to 50%  Hypertension Home meds: Coreg 6.25 Mg, losartan 50 Mg, spironolactone 25 Mg Stable Long-term BP goal normotensive  Hyperlipidemia Home meds: Atorvastatin 40 mg, resumed in hospital LDL 64, goal < 70 Continue statin at discharge  Substance Abuse Patient uses THC UDS positive for  THC        Ready to quit? Yes TOC consult for cessation placed  Other Stroke Risk Factors ETOH use, alcohol level <10, advised to drink no more than 2 drink(s) a day Obesity, Body mass index is 34.08 kg/m., BMI >/= 30 associated with increased stroke risk, recommend weight loss, diet and exercise as appropriate   Other Active Problems AKI, creatinine 1.3, continue monitoring  Hospital day # 1  Gevena Mart DNP, ACNPC-AG  [facility]  ATTENDING NOTE: I reviewed above note and agree with the assessment and plan. Pt was seen and examined.   Wife at bedside.  Patient neuro intact except intermittent word finding difficulty.  Otherwise neuro exam negative.  Patient had a history of 2 STEMI now on aspirin and Brilinta.  Denies any heart palpitation or irregular heartbeat history.  However given the cortical stroke and extensive cardiac history, etiology concerning for  cardioembolic source.  EF 45 to 50%, down from 60 to 65% 3 months ago.  Will continue with TEE and possible loop recorder.  DVT negative.  TCD bubble study pending.  Continue aspirin and Brilinta for now.  THC cessation education provided.  Continue statin.  For detailed  assessment and plan, please refer to above/below as I have made changes wherever appropriate.   Marvel Plan, MD PhD Stroke Neurology 12/10/2023 4:36 PM  This patient is critically ill due to distal left M2 occlusion with left MCA stroke, history of STEMI x 2 and at significant risk of neurological worsening, death form recurrent stroke, hemorrhagic transmission, heart attack. This patient's care requires constant monitoring of vital signs, hemodynamics, respiratory and cardiac monitoring, review of multiple databases, neurological assessment, discussion with family, other specialists and medical decision making of high complexity. I spent 40 minutes of neurocritical care time in the care of this patient. I had long discussion with wife and patient at bedside, updated pt current condition, treatment plan and potential prognosis, and answered all the questions.  They expressed understanding and appreciation.       To contact Stroke Continuity provider, please refer to [email or web address]. After hours, contact General Neurology

## 2023-12-10 NOTE — H&P (Addendum)
 NEUROLOGY H&P NOTE   Date of service: December 10, 2023 Patient Name: Devin Robert Burundi Jr. MRN:  [id number] DOB:  03-07-1971 Chief Complaint: "Difficulty speaking"  History of Present Illness  Devin Robert Burundi Jr. is a 53 y.o. male with hx of hypertension, hyperlipidemia, STEMI with PCI to his LAD now on Brilinta and aspirin who presents with abrupt onset of difficulty speaking this afternoon.  He states that his last actual conversation and was with his wife before she left to go shopping around 3:30 PM.  When she returned around 6 PM, he was having difficulty speaking and his children and noticed it at least as early as 4:30 PM.  Due to the symptoms a code stroke was activated and he was evaluated emergently.  He was taken for CT which revealed some CT change.  I discussed the risks, benefits, and alternatives of IV TNK with the patient and his wife.  Given the relatively mild nature of his deficits, the patient chose to not proceed with IV TNK and I think this is reasonable.  Last known well: 3:30 PM Modified rankin score: 0-Completely asymptomatic and back to baseline post- stroke IV Thrombolysis: No, patient declined Thrombectomy: No, mild symptoms NIHSS components Score: Comment  1a Level of Conscious 0[x]  1[]  2[]  3[]      1b LOC Questions 0[x]  1[]  2[]       1c LOC Commands 0[x]  1[]  2[]       2 Best Gaze 0[x]  1[]  2[]       3 Visual 0[x]  1[]  2[]  3[]      4 Facial Palsy 0[x]  1[]  2[]  3[]      5a Motor Arm - left 0[x]  1[]  2[]  3[]  4[]  UN[]    5b Motor Arm - Right 0[x]  1[]  2[]  3[]  4[]  UN[]    6a Motor Leg - Left 0[x]  1[]  2[]  3[]  4[]  UN[]    6b Motor Leg - Right 0[x]  1[]  2[]  3[]  4[]  UN[]    7 Limb Ataxia 0[x]  1[]  2[]  3[]  UN[]     8 Sensory 0[x]  1[]  2[]  UN[]      9 Best Language 0[]  1[x]  2[]  3[]      10 Dysarthria 0[x]  1[]  2[]  UN[]      11 Extinct. and Inattention 0[x]  1[]  2[]       TOTAL: 1      Past History   Past Medical History:  Diagnosis Date   Coronary artery disease    Family history  of heart disease    HTN (hypertension)    Hyperlipidemia    Palpitations    STEMI (ST elevation myocardial infarction) (HCC)    PCI/DES to p/mLAD, 60% lcx, 40% ostial LM, and 60% PDA, EF 45%   Past Surgical History:  Procedure Laterality Date   CORONARY/GRAFT ACUTE MI REVASCULARIZATION N/A 12/21/2018   Procedure: Coronary/Graft Acute MI Revascularization;  Surgeon: Lyn Records, MD;  Location: [facility] INVASIVE CV LAB;  Service: Cardiovascular;  Laterality: N/A;   CORONARY/GRAFT ACUTE MI REVASCULARIZATION N/A 09/14/2023   Procedure: Coronary/Graft Acute MI Revascularization;  Surgeon: Yates Decamp, MD;  Location: [facility] INVASIVE CV LAB;  Service: Cardiovascular;  Laterality: N/A;   LEFT HEART CATH AND CORONARY ANGIOGRAPHY N/A 12/21/2018   Procedure: LEFT HEART CATH AND CORONARY ANGIOGRAPHY;  Surgeon: Lyn Records, MD;  Location: [facility] INVASIVE CV LAB;  Service: Cardiovascular;  Laterality: N/A;   Family History  Problem Relation Age of Onset   Heart disease Mother    Heart disease Father 52       CABG   Hypertension Father  Aortic aneurysm Father 29   Skin cancer Maternal Grandmother    Heart failure Maternal Grandfather    COPD Maternal Grandfather    Diabetes Paternal Grandmother    Social History   Socioeconomic History   Marital status: Married    Spouse name: Not on file   Number of children: Not on file   Years of education: Not on file   Highest education level: Not on file  Occupational History   Not on file  Tobacco Use   Smoking status: Never   Smokeless tobacco: Never  Substance and Sexual Activity   Alcohol use: Not on file   Drug use: Never   Sexual activity: Not on file  Other Topics Concern   Not on file  Social History Narrative   Not on file   Social Drivers of Health   Financial Resource Strain: Not on file  Food Insecurity: No Food Insecurity (09/17/2023)   Hunger Vital Sign    Worried About Running Out of Food in the Last Year: Never true    Ran Out of  Food in the Last Year: Never true  Transportation Needs: No Transportation Needs (09/17/2023)   PRAPARE - Administrator, Civil Service (Medical): No    Lack of Transportation (Non-Medical): No  Physical Activity: Not on file  Stress: Not on file  Social Connections: Not on file   No Known Allergies  Medications   Current Facility-Administered Medications:     stroke: early stages of recovery book, , Does not apply, Once, Rejeana Brock, MD   0.9 %  sodium chloride infusion, , Intravenous, Continuous, Rejeana Brock, MD, Last Rate: 75 mL/hr at 12/10/23 0000, Infusion Verify at 12/10/23 0000   acetaminophen (TYLENOL) tablet 650 mg, 650 mg, Oral, Q4H PRN **OR** acetaminophen (TYLENOL) 160 MG/5ML solution 650 mg, 650 mg, Per Tube, Q4H PRN **OR** acetaminophen (TYLENOL) suppository 650 mg, 650 mg, Rectal, Q4H PRN, Rejeana Brock, MD   aspirin suppository 300 mg, 300 mg, Rectal, Daily **OR** aspirin tablet 325 mg, 325 mg, Oral, Daily, Rejeana Brock, MD, 325 mg at 12/09/23 2053   Chlorhexidine Gluconate Cloth 2 % PADS 6 each, 6 each, Topical, Daily, Rejeana Brock, MD   clopidogrel (PLAVIX) tablet 300 mg, 300 mg, Oral, Once **AND** [START ON 12/11/2023] clopidogrel (PLAVIX) tablet 75 mg, 75 mg, Oral, Daily, Rejeana Brock, MD   enoxaparin (LOVENOX) injection 40 mg, 40 mg, Subcutaneous, Q24H, Rejeana Brock, MD, 40 mg at 12/09/23 2054   Vitals   Vitals:   12/09/23 2133 12/09/23 2200 12/09/23 2300 12/10/23 0000  BP:  (!) 117/91 135/78   Pulse: 64 68  64  Resp: 11 14 15 19   Temp:    98.5 F (36.9 C)  TempSrc:    Oral  SpO2: 98% 98%  95%  Weight:      Height:         Body mass index is 34.08 kg/m.  Physical Exam   Constitutional: Appears well-developed and well-nourished.  Psych: Affect appropriate to situation.  Eyes: No scleral injection.  HENT: No OP obstruction.  Head: Normocephalic.  Cardiovascular: Normal  rate and regular rhythm.  Respiratory: Effort normal, non-labored breathing.  GI: Soft.  No distension. There is no tenderness.  Skin: WDI.   Neurologic Examination    Neuro: Mental Status: Patient is awake, alert, oriented to person, place, month, year, and situation. Patient is able to give a clear and coherent history. No signs of  neglect He has a mild aphasia, is able to name all objects on the cards except for hemic, however when describing the picture he makes frequent errors and is halting in his description.  He is able to repeat, and he is able to read. Cranial Nerves: II: Visual Fields are full. Pupils are equal, round, and reactive to light.   III,IV, VI: EOMI without ptosis or diploplia.  V: Facial sensation is symmetric to temperature VII: Facial movement is symmetric.  VIII: hearing is intact to voice X: Uvula elevates symmetrically XII: tongue is midline without atrophy or fasciculations.  Motor: Tone is normal. Bulk is normal. 5/5 strength was present in all four extremities.  Sensory: Sensation is symmetric to light touch and temperature in the arms and legs. Cerebellar: FNF and HKS are intact bilaterally   Labs   CBC:  Recent Labs  Lab 12/09/23 1932 12/09/23 2306  WBC 7.4 8.9  NEUTROABS 5.3  --   HGB 15.9  15.3 16.7  HCT 46.2  45.0 49.1  MCV 84.2 83.6  PLT 268 282   Basic Metabolic Panel:  Lab Results  Component Value Date   NA 138 12/09/2023   NA 141 12/09/2023   K 4.0 12/09/2023   K 4.0 12/09/2023   CO2 24 12/09/2023   GLUCOSE 100 (H) 12/09/2023   GLUCOSE 94 12/09/2023   BUN 14 12/09/2023   BUN 16 12/09/2023   CREATININE 1.27 (H) 12/09/2023   CALCIUM 8.4 (L) 12/09/2023   GFRNONAA >60 12/09/2023   GFRAA 98 06/24/2019   Lipid Panel:  Lab Results  Component Value Date   LDLCALC 64 12/09/2023   HgbA1c:  Lab Results  Component Value Date   HGBA1C 5.0 09/14/2023   Urine Drug Screen: No results found for: "LABOPIA", "COCAINSCRNUR",  "LABBENZ", "AMPHETMU", "THCU", "LABBARB"  Alcohol Level     Component Value Date/Time   ETH <10 12/09/2023 1932   INR  Lab Results  Component Value Date   INR 1.1 12/09/2023   APTT  Lab Results  Component Value Date   APTT 27 12/09/2023     CT Head without contrast(Personally reviewed): Early CT change  CT angio Head and Neck with contrast(Personally reviewed): Distal M2 occlusion   Assessment   Tomasa Hosteller Bur[place] Jr. is a 53 y.o. male presenting with acute aphasia.  His symptoms are mild, however he does have an M2 occlusion.  Based on this, I would favor watching closely in case his symptoms worsen we could consider performing a repeat perfusion at that time.  He will need etiological workup including echocardiogram.  Primary Diagnosis:  Cerebral infarction due to embolism of  left middle cerebral artery.   Secondary Diagnosis: Essential (primary) hypertension  Recommendations  -Admit to the neuro ICU for frequent neurochecks - HgbA1c, fasting lipid panel - MRI of the brain without contrast - Frequent neuro checks - Echocardiogram - Prophylactic therapy-Antiplatelet med: Aspirin - dose 81mg  and Brilinta 90 mg twice daily (home meds) - Risk factor modification - Telemetry monitoring - PT consult, OT consult, Speech consult - Stroke team to follow  ______________________________________________________________________  This patient is critically ill and at significant risk of neurological worsening, death and care requires constant monitoring of vital signs, hemodynamics,respiratory and cardiac monitoring, neurological assessment, discussion with family, other specialists and medical decision making of high complexity. I spent 60 minutes of neurocritical care time  in the care of  this patient. This was time spent independent of any time provided by nurse practitioner or  PA.  Ritta Slot, MD [facility]   If 7pm- 7am, please page neurology  on call as listed in AMION. 12/10/2023  1:42 AM   Risks, benefits and alternatives of IVT discussed with patient and/or family and they agreed. CTH personally reviewed prior to TNK administration

## 2023-12-10 NOTE — Evaluation (Signed)
 Physical Therapy Evaluation and Discharge Patient Details Name: Devin Robert Burundi Jr. MRN: [id number] DOB: [birth year] Today's Date: 12/10/2023  History of Present Illness  Pt is 53 yo male who presents on 12/09/23 with dysphasia. MRI showed L MCA CVA. PMH: CAD, STEMI 2020 s/p PCI, STEMI 2024 s/p PCI, HTN, HLD  Clinical Impression  Patient evaluated by Physical Therapy with no further acute PT needs identified. All education has been completed and the patient has no further questions. 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 Pt strength WFL and equal bilaterally. Pt with mildly delayed problem solving and mild expressive deficits. Independent with ambulation and stairs. PT is signing off. Thank you for this referral.         If plan is discharge home, recommend the following:     Can travel by private vehicle        Equipment Recommendations None recommended by PT  Recommendations for Other Services  Speech consult    Functional Status Assessment Patient has not had a recent decline in their functional status     Precautions / Restrictions Precautions Precautions: None Restrictions Weight Bearing Restrictions Per Provider Order: No      Mobility  Bed Mobility Overal bed mobility: Independent                  Transfers Overall transfer level: Independent Equipment used: None                    Ambulation/Gait Ambulation/Gait assistance: Independent Gait Distance (Feet): 400 Feet Assistive device: None Gait Pattern/deviations: WFL(Within Functional Limits) Gait velocity: WFL Gait velocity interpretation: >4.37 ft/sec, indicative of normal walking speed   General Gait Details: able to make changes in speed and direction without deviation, denied dizziness, normal gait pattern  Stairs Stairs: Yes Stairs assistance: Independent Stair Management: No rails, Alternating pattern, Forwards Number of Stairs: 4 General stair comments:  number limited only by length of IV line. no limitations, no need for rail  Wheelchair Mobility     Tilt Bed    Modified Rankin (Stroke Patients Only) Modified Rankin (Stroke Patients Only) Pre-Morbid Rankin Score: No symptoms Modified Rankin: No significant disability     Balance Overall balance assessment: Independent                               Standardized Balance Assessment Standardized Balance Assessment : Berg Balance Test Berg Balance Test Sit to Stand: Able to stand without using hands and stabilize independently Standing Unsupported: Able to stand safely 2 minutes Sitting with Back Unsupported but Feet Supported on Floor or Stool: Able to sit safely and securely 2 minutes Stand to Sit: Sits safely with minimal use of hands Transfers: Able to transfer safely, minor use of hands Standing Unsupported with Eyes Closed: Able to stand 10 seconds safely Standing Ubsupported with Feet Together: Able to place feet together independently and stand 1 minute safely From Standing, Reach Forward with Outstretched Arm: Can reach confidently >25 cm (10") From Standing Position, Pick up Object from Floor: Able to pick up shoe safely and easily From Standing Position, Turn to Look Behind Over each Shoulder: Looks behind from both sides and weight shifts well Turn 360 Degrees: Able to turn 360 degrees safely in 4 seconds or less Standing Unsupported, Alternately Place Feet on Step/Stool: Able to stand independently and safely and complete 8 steps in 20 seconds Standing Unsupported, One  Foot in Front: Able to place foot tandem independently and hold 30 seconds Standing on One Leg: Able to lift leg independently and hold > 10 seconds Total Score: 56         Pertinent Vitals/Pain Pain Assessment Pain Assessment: No/denies pain    Home Living Family/patient expects to be discharged to:: Private residence Living Arrangements: Spouse/significant other;Children Available  Help at Discharge: Family;Available PRN/intermittently Type of Home: House Home Access: Stairs to enter Entrance Stairs-Rails: Right Entrance Stairs-Number of Steps: 4 Alternate Level Stairs-Number of Steps: flight Home Layout: Able to live on main level with bedroom/bathroom;Multi-level Home Equipment: None Additional Comments: pt works full time in Airline pilot. Wife is an eye doctor. One son in college, 54 yo still at home.    Prior Function Prior Level of Function : Independent/Modified Independent;Working/employed;Driving                     Extremity/Trunk Assessment   Upper Extremity Assessment Upper Extremity Assessment: Overall WFL for tasks assessed    Lower Extremity Assessment Lower Extremity Assessment: Overall WFL for tasks assessed    Cervical / Trunk Assessment Cervical / Trunk Assessment: Normal  Communication   Communication Communication: Impaired    Cognition Arousal: Alert Behavior During Therapy: WFL for tasks assessed/performed   PT - Cognitive impairments: Problem solving                       PT - Cognition Comments: mildly delayed problem solving Following commands: Intact       Cueing Cueing Techniques: Verbal cues     General Comments General comments (skin integrity, edema, etc.): discussed brain rest and brain healing. VSS throughout. BP 128/88    Exercises     Assessment/Plan    PT Assessment Patient does not need any further PT services  PT Problem List         PT Treatment Interventions      PT Goals (Current goals can be found in the Care Plan section)  Acute Rehab PT Goals Patient Stated Goal: return to home and work PT Goal Formulation: All assessment and education complete, DC therapy    Frequency       Co-evaluation               AM-PAC PT "6 Clicks" Mobility  Outcome Measure Help needed turning from your back to your side while in a flat bed without using bedrails?: None Help needed moving  from lying on your back to sitting on the side of a flat bed without using bedrails?: None Help needed moving to and from a bed to a chair (including a wheelchair)?: None Help needed standing up from a chair using your arms (e.g., wheelchair or bedside chair)?: None Help needed to walk in hospital room?: None Help needed climbing 3-5 steps with a railing? : None 6 Click Score: 24    End of Session   Activity Tolerance: Patient tolerated treatment well Patient left: in chair;with call bell/phone within reach;with family/visitor present Nurse Communication: Mobility status PT Visit Diagnosis: Unsteadiness on feet (R26.81)    Time: 1610-9604 PT Time Calculation (min) (ACUTE ONLY): 26 min   Charges:   PT Evaluation $PT Eval Low Complexity: 1 Low PT Treatments $Gait Training: 8-22 mins PT General Charges $$ ACUTE PT VISIT: 1 Visit         Lyanne Co, PT  Acute Rehab Services Secure chat preferred Office [phone]   Lawana Chambers Zakaria Fromer 12/10/2023,  11:15 AM

## 2023-12-10 NOTE — Plan of Care (Signed)
 Received from 4N at 1742.  Oriented to room and surroundings.  Ambulated with assist.    Problem: Coping: Goal: Will verbalize positive feelings about self Outcome: Progressing   Problem: Nutrition: Goal: Dietary intake will improve Outcome: Progressing   Problem: Activity: Goal: Risk for activity intolerance will decrease Outcome: Progressing   Problem: Coping: Goal: Level of anxiety will decrease Outcome: Progressing

## 2023-12-10 NOTE — Progress Notes (Signed)
   [facility] has been requested to perform a transesophageal echocardiogram on Devin Wright. for stroke.    The patient does NOT have any absolute or relative contraindications to a Transesophageal Echocardiogram (TEE).  The patient has: No other conditions that may impact this procedure.    After careful review of history and examination, the risks and benefits of transesophageal echocardiogram have been explained including risks of esophageal damage, perforation (1:10,000 risk), bleeding, pharyngeal hematoma as well as other potential complications associated with conscious sedation including aspiration, arrhythmia, respiratory failure and death. Alternatives to treatment were discussed, questions were answered. Patient is willing to proceed.   Signed, Olena Leatherwood, PA-C  12/10/2023 3:29 PM

## 2023-12-11 ENCOUNTER — Inpatient Hospital Stay (HOSPITAL_COMMUNITY): Payer: BC Managed Care – PPO

## 2023-12-11 ENCOUNTER — Encounter (HOSPITAL_COMMUNITY): Admission: EM | Disposition: A | Discharge status: Home / Self Care | Payer: Self-pay | Source: Home / Self Care

## 2023-12-11 ENCOUNTER — Encounter (HOSPITAL_COMMUNITY): Payer: Self-pay | Admitting: Cardiology

## 2023-12-11 ENCOUNTER — Inpatient Hospital Stay (HOSPITAL_COMMUNITY): Payer: BC Managed Care – PPO | Admitting: Anesthesiology

## 2023-12-11 DIAGNOSIS — I639 Cerebral infarction, unspecified: Secondary | ICD-10-CM | POA: Diagnosis not present

## 2023-12-11 DIAGNOSIS — I340 Nonrheumatic mitral (valve) insufficiency: Secondary | ICD-10-CM

## 2023-12-11 DIAGNOSIS — I2510 Atherosclerotic heart disease of native coronary artery without angina pectoris: Secondary | ICD-10-CM | POA: Diagnosis not present

## 2023-12-11 DIAGNOSIS — F1990 Other psychoactive substance use, unspecified, uncomplicated: Secondary | ICD-10-CM | POA: Insufficient documentation

## 2023-12-11 DIAGNOSIS — I110 Hypertensive heart disease with heart failure: Secondary | ICD-10-CM | POA: Diagnosis not present

## 2023-12-11 DIAGNOSIS — I5043 Acute on chronic combined systolic (congestive) and diastolic (congestive) heart failure: Secondary | ICD-10-CM | POA: Diagnosis not present

## 2023-12-11 HISTORY — PX: LOOP RECORDER INSERTION: EP1214

## 2023-12-11 HISTORY — PX: TRANSESOPHAGEAL ECHOCARDIOGRAM (CATH LAB): EP1270

## 2023-12-11 LAB — CBC
HCT: 45.2 % (ref 39.0–52.0)
Hemoglobin: 15.7 g/dL (ref 13.0–17.0)
MCH: 29 pg (ref 26.0–34.0)
MCHC: 34.7 g/dL (ref 30.0–36.0)
MCV: 83.4 fL (ref 80.0–100.0)
Platelets: 274 10*3/uL (ref 150–400)
RBC: 5.42 MIL/uL (ref 4.22–5.81)
RDW: 11.9 % (ref 11.5–15.5)
WBC: 6 10*3/uL (ref 4.0–10.5)
nRBC: 0 % (ref 0.0–0.2)

## 2023-12-11 LAB — BASIC METABOLIC PANEL
Anion gap: 11 (ref 5–15)
BUN: 12 mg/dL (ref 6–20)
CO2: 24 mmol/L (ref 22–32)
Calcium: 8.6 mg/dL — ABNORMAL LOW (ref 8.9–10.3)
Chloride: 105 mmol/L (ref 98–111)
Creatinine, Ser: 1.13 mg/dL (ref 0.61–1.24)
GFR, Estimated: >60 mL/min (ref >60)
Glucose, Bld: 93 mg/dL (ref 70–99)
Potassium: 4.1 mmol/L (ref 3.5–5.1)
Sodium: 140 mmol/L (ref 135–145)

## 2023-12-11 LAB — HEMOGLOBIN A1C
Hgb A1c MFr Bld: 4.9 % (ref 4.8–5.6)
Mean Plasma Glucose: 93.93 mg/dL

## 2023-12-11 SURGERY — LOOP RECORDER INSERTION
Anesthesia: LOCAL

## 2023-12-11 SURGERY — TRANSESOPHAGEAL ECHOCARDIOGRAM (TEE) (CATHLAB)
Anesthesia: Monitor Anesthesia Care

## 2023-12-11 MED ORDER — STUDY - OCEANIC-STROKE - ASUNDEXIAN 50 MG OR PLACEBO TABLET (PI-SETHI)
1.0000 | ORAL_TABLET | Freq: Every day | ORAL | Status: DC
  Administered 2023-12-11: 50 mg via ORAL
  Filled 2023-12-11: qty 1

## 2023-12-11 MED ORDER — LIDOCAINE-EPINEPHRINE 1 %-1:100000 IJ SOLN
INTRAMUSCULAR | Status: DC | PRN
  Administered 2023-12-11: 10 mL

## 2023-12-11 MED ORDER — SODIUM CHLORIDE 0.9 % IV SOLN: INTRAVENOUS | Status: DC | PRN

## 2023-12-11 MED ORDER — LIDOCAINE-EPINEPHRINE 1 %-1:100000 IJ SOLN
INTRAMUSCULAR | Status: AC
  Filled 2023-12-11: qty 1

## 2023-12-11 MED ORDER — STUDY - OCEANIC-STROKE - ASUNDEXIAN 50 MG OR PLACEBO TABLET (PI-SETHI): 1.0000 | ORAL_TABLET | Freq: Every day | ORAL | 0 refills | Status: DC

## 2023-12-11 MED ORDER — LIDOCAINE 2% (20 MG/ML) 5 ML SYRINGE
INTRAMUSCULAR | Status: DC | PRN
  Administered 2023-12-11: 100 mg via INTRAVENOUS

## 2023-12-11 MED ORDER — PROPOFOL 10 MG/ML IV BOLUS
INTRAVENOUS | Status: DC | PRN
  Administered 2023-12-11: 430 mg via INTRAVENOUS

## 2023-12-11 SURGICAL SUPPLY — 2 items
MONITOR REVEAL LINQ II (Prosthesis & Implant Heart) IMPLANT
PACK LOOP INSERTION (CUSTOM PROCEDURE TRAY) ×1 IMPLANT

## 2023-12-11 NOTE — Anesthesia Preprocedure Evaluation (Addendum)
 Anesthesia Evaluation  Patient identified by MRN, date of birth, ID band Patient awake    Reviewed: Allergy & Precautions, H&P , NPO status , Patient's Chart, lab work & pertinent test results, reviewed documented beta blocker date and time   Airway Mallampati: II  TM Distance: >3 FB Neck ROM: Full    Dental no notable dental hx. (+) Teeth Intact, Dental Advisory Given   Pulmonary neg pulmonary ROS   Pulmonary exam normal breath sounds clear to auscultation       Cardiovascular hypertension, Pt. on medications and Pt. on home beta blockers + CAD, + Past MI and + Cardiac Stents   Rhythm:Regular Rate:Normal     Neuro/Psych CVA, No Residual Symptoms  negative psych ROS   GI/Hepatic negative GI ROS, Neg liver ROS,,,  Endo/Other  negative endocrine ROS    Renal/GU negative Renal ROS  negative genitourinary   Musculoskeletal   Abdominal   Peds  Hematology negative hematology ROS (+)   Anesthesia Other Findings   Reproductive/Obstetrics negative OB ROS                             Anesthesia Physical Anesthesia Plan  ASA: 3  Anesthesia Plan: MAC   Post-op Pain Management: Minimal or no pain anticipated   Induction: Intravenous  PONV Risk Score and Plan: 1 and Propofol infusion  Airway Management Planned: Natural Airway and Simple Face Mask  Additional Equipment:   Intra-op Plan:   Post-operative Plan:   Informed Consent: I have reviewed the patients History and Physical, chart, labs and discussed the procedure including the risks, benefits and alternatives for the proposed anesthesia with the patient or authorized representative who has indicated his/her understanding and acceptance.     Dental advisory given  Plan Discussed with: CRNA  Anesthesia Plan Comments:        Anesthesia Quick Evaluation

## 2023-12-11 NOTE — Discharge Summary (Addendum)
 Stroke Discharge Summary  Patient ID: Devin Robert Bur[place] Jr.    l   MRN: [id number]      DOB: [birth year]  Date of Admission: 12/09/2023 Date of Discharge: 12/11/2023  Attending Physician:  Stroke, Md, MD Consultant(s):    None  Patient's PCP:  Merri Brunette, MD  DISCHARGE PRIMARY DIAGNOSIS:  Stroke:  left insular cortex and left frontal operculum patchy infarcts, etiology: Likely cardioembolic source    Secondary Diagnoses: Hypertension Hyperlipidemia  CAD with STEMI s/p stenting x 2 Ischemic cardiomyopathy Obesity  AKI, resolved   Allergies as of 12/11/2023   No Known Allergies      Medication List     STOP taking these medications    cephALEXin 500 MG capsule Commonly known as: KEFLEX       TAKE these medications    Aspirin Low Dose 81 MG chewable tablet Generic drug: aspirin Chew 1 tablet (81 mg total) by mouth daily.   atorvastatin 40 MG tablet Commonly known as: LIPITOR Take 1 tablet (40 mg total) by mouth daily.   carvedilol 6.25 MG tablet Commonly known as: COREG Take 1 tablet (6.25 mg total) by mouth 2 (two) times daily with a meal.   losartan 50 MG tablet Commonly known as: COZAAR Take 1 tablet (50 mg total) by mouth daily.   nitroGLYCERIN 0.4 MG SL tablet Commonly known as: NITROSTAT Place 1 tablet (0.4 mg total) under the tongue every 5 (five) minutes as needed for chest pain.   OCEANIC-STROKE asundexian or placebo 50 mg tablet Take 1 tablet (50 mg total) by mouth daily. For Investigational use only   Praluent 150 MG/ML Soaj Generic drug: Alirocumab Inject 1 pen under the skin every 14 days.   spironolactone 25 MG tablet Commonly known as: ALDACTONE Take 0.5 tablets (12.5 mg total) by mouth daily.   ticagrelor 90 MG Tabs tablet Commonly known as: BRILINTA Take 1 tablet (90 mg total) by mouth 2 (two) times daily.        LABORATORY STUDIES CBC    Component Value Date/Time   WBC 6.0 12/11/2023 0605   RBC 5.42 12/11/2023 0605    HGB 15.7 12/11/2023 0605   HGB 17.7 01/06/2019 1313   HCT 45.2 12/11/2023 0605   HCT 51.7 (H) 01/06/2019 1313   PLT 274 12/11/2023 0605   PLT 317 01/06/2019 1313   MCV 83.4 12/11/2023 0605   MCV 86 01/06/2019 1313   MCH 29.0 12/11/2023 0605   MCHC 34.7 12/11/2023 0605   RDW 11.9 12/11/2023 0605   RDW 12.7 01/06/2019 1313   LYMPHSABS 1.3 12/09/2023 1932   MONOABS 0.5 12/09/2023 1932   EOSABS 0.2 12/09/2023 1932   BASOSABS 0.1 12/09/2023 1932   CMP    Component Value Date/Time   NA 140 12/11/2023 0605   NA 141 10/19/2022 0929   K 4.1 12/11/2023 0605   CL 105 12/11/2023 0605   CO2 24 12/11/2023 0605   GLUCOSE 93 12/11/2023 0605   BUN 12 12/11/2023 0605   BUN 16 10/19/2022 0929   CREATININE 1.13 12/11/2023 0605   CALCIUM 8.6 (L) 12/11/2023 0605   PROT 5.9 (L) 12/09/2023 1932   PROT 5.9 (L) 10/19/2022 0929   ALBUMIN 3.6 12/09/2023 1932   ALBUMIN 4.4 10/19/2022 0929   AST 35 12/09/2023 1932   ALT 57 (H) 12/09/2023 1932   ALKPHOS 68 12/09/2023 1932   BILITOT 0.6 12/09/2023 1932   BILITOT 0.8 10/19/2022 0929   GFRNONAA >60 12/11/2023 0605   GFRAA 98 06/24/2019  0818   COAGS Lab Results  Component Value Date   INR 1.1 12/09/2023   INR 1.4 (H) 09/14/2023   INR 1.1 12/21/2018   Lipid Panel    Component Value Date/Time   CHOL 124 12/09/2023 2306   CHOL 114 10/19/2022 0929   TRIG 159 (H) 12/09/2023 2306   HDL 28 (L) 12/09/2023 2306   HDL 36 (L) 10/19/2022 0929   CHOLHDL 4.4 12/09/2023 2306   VLDL 32 12/09/2023 2306   LDLCALC 64 12/09/2023 2306   LDLCALC 54 10/19/2022 0929   HgbA1C  Lab Results  Component Value Date   HGBA1C 4.9 12/11/2023   Urine Drug Screen THC Alcohol Level    Component Value Date/Time   ETH <10 12/09/2023 1932     SIGNIFICANT DIAGNOSTIC STUDIES EP PPM/ICD IMPLANT Result Date: 12/11/2023 SURGEON:  Maurice Small, MD   PREPROCEDURE DIAGNOSIS:  Cryptogenic stroke   POSTPROCEDURE DIAGNOSIS:  Cryptogenic stroke    PROCEDURES:  1.  Implantable loop recorder implantation   INTRODUCTION:  Devin Robert [place] Jr.  is a 53 y.o. patient with a history of cryptogenic stroke. Inpatient telemetry has been reviewed and not shown atrial fibrillation. The patient therefore presents today for implantable loop implantation. The costs of loop recorder monitoring have been discussed with the patient.   DESCRIPTION OF PROCEDURE:  Informed written consent was obtained.  A timeout was performed. The patient required no sedation for the procedure today.  The patients left chest was prepped and draped in the usual sterile fashion. The skin overlying the left parasternal region was infiltrated with lidocaine for local analgesia.  A 0.5-cm incision was made over the left parasternal region over the 3rd intercostal space.  A Medtronic implantable loop recorder (SN [id number]) was then placed inserted through the incision.  Steri- Strips and a sterile dressing were then applied.  There were no early apparent complications.   CONCLUSIONS:  1. Successful implantation of a Medtronic implantable loop recorder for a history of cryptogenic stroke  2. No early apparent complications. Maurice Small, MD Cardiac Electrophysiology   ECHO TEE Result Date: 12/11/2023    TRANSESOPHOGEAL ECHO REPORT   Patient Name:   Devin Osorto [place] Jr. Date of Exam: 12/11/2023 Medical Rec #:  [id number]               Height:       74.0 in Accession #:    [id number]              Weight:       265.4 lb Date of Birth:  11/07/1970                BSA:          2.451 m Patient Age:    53 years                BP:           134/91 mmHg Patient Gender: M                       HR:           77 bpm. Exam Location:  Inpatient Procedure: Transesophageal Echo, Limited Color Doppler and Cardiac Doppler (Both            Spectral and Color Flow Doppler were utilized during procedure). Indications:     CAD  History:         Patient has prior history of Echocardiogram examinations,  most                   recent 12/10/2023. Cardiomyopathy, Previous Myocardial                  Infarction and CAD; Risk Factors:Hypertension, Dyslipidemia and                  Non-Smoker.  Sonographer:     Dondra Prader RVT RCS Referring Phys:  Olena Leatherwood Diagnosing Phys: Donato Schultz MD PROCEDURE: After discussion of the risks and benefits of a TEE, an informed consent was obtained from the patient. The transesophogeal probe was passed without difficulty through the esophogus of the patient. Sedation performed by different physician. The patient's vital signs; including heart rate, blood pressure, and oxygen saturation; remained stable throughout the procedure. The patient developed no complications during the procedure.  IMPRESSIONS  1. Left ventricular ejection fraction, by estimation, is 55 to 60%. The left ventricle has normal function. The left ventricle has no regional wall motion abnormalities.  2. Right ventricular systolic function is normal. The right ventricular size is normal.  3. No left atrial/left atrial appendage thrombus was detected.  4. The mitral valve is normal in structure. Mild mitral valve regurgitation. No evidence of mitral stenosis.  5. The aortic valve is normal in structure. Aortic valve regurgitation is not visualized. No aortic stenosis is present.  6. The inferior vena cava is normal in size with greater than 50% respiratory variability, suggesting right atrial pressure of 3 mmHg.  7. Agitated saline contrast bubble study was negative, with no evidence of any interatrial shunt.  8. 3D performed of the mitral valve and demonstrates Normal leaflets. Conclusion(s)/Recommendation(s): No LA/LAA thrombus identified. Negative bubble study for interatrial shunt. No intracardiac source of embolism detected on this on this transesophageal echocardiogram. FINDINGS  Left Ventricle: Left ventricular ejection fraction, by estimation, is 55 to 60%. The left ventricle has normal function. The left ventricle has no  regional wall motion abnormalities. Strain imaging was not performed. The left ventricular internal cavity  size was normal in size. There is no left ventricular hypertrophy. Right Ventricle: The right ventricular size is normal. No increase in right ventricular wall thickness. Right ventricular systolic function is normal. Left Atrium: Left atrial size was normal in size. No left atrial/left atrial appendage thrombus was detected. Right Atrium: Right atrial size was normal in size. Pericardium: There is no evidence of pericardial effusion. Mitral Valve: The mitral valve is normal in structure. Mild mitral valve regurgitation. No evidence of mitral valve stenosis. Tricuspid Valve: The tricuspid valve is normal in structure. Tricuspid valve regurgitation is not demonstrated. No evidence of tricuspid stenosis. Aortic Valve: The aortic valve is normal in structure. Aortic valve regurgitation is not visualized. No aortic stenosis is present. Pulmonic Valve: The pulmonic valve was normal in structure. Pulmonic valve regurgitation is not visualized. No evidence of pulmonic stenosis. Aorta: The aortic root is normal in size and structure. Venous: The inferior vena cava is normal in size with greater than 50% respiratory variability, suggesting right atrial pressure of 3 mmHg. IAS/Shunts: No atrial level shunt detected by color flow Doppler. Agitated saline contrast was given intravenously to evaluate for intracardiac shunting. Agitated saline contrast bubble study was negative, with no evidence of any interatrial shunt. Additional Comments: 3D imaging was not performed. MR Peak grad:    125.4 mmHg MR Mean grad:    80.0 mmHg MR Vmax:  560.00 cm/s MR Vmean:        414.0 cm/s MR PISA:         2.26 cm MR PISA Eff ROA: 28 mm MR PISA Radius:  0.60 cm Donato Schultz MD Electronically signed by Donato Schultz MD Signature Date/Time: 12/11/2023/2:59:21 PM    Final    VAS [place] TRANSCRANIAL DOPPLER W BUBBLES Result Date:  12/11/2023  Transcranial Doppler with Bubble Patient Name:  Devin KNACK [place] JR.  Date of Exam:   12/10/2023 Medical Rec #: [id number]                Accession #:    [id number] Date of Birth: 30-Apr-1971                 Patient Gender: M Patient Age:   40 years Exam Location:  [facility] Procedure:      VAS [place] TRANSCRANIAL DOPPLER W BUBBLES Referring Phys: Scheryl Marten Ferris Tally --------------------------------------------------------------------------------  Indications: Stroke. Comparison Study: No prior study on file Performing Technologist: Sherren Kerns RVS  Examination Guidelines: A complete evaluation includes B-mode imaging, spectral Doppler, color Doppler, and power Doppler as needed of all accessible portions of each vessel. Bilateral testing is considered an integral part of a complete examination. Limited examinations for reoccurring indications may be performed as noted.  Summary: No HITS at rest or during Valsalva. Negative transcranial Doppler Bubble study with no evidence of right to left intracardiac communication.  A vascular evaluation was performed. The right middle cerebral artery was studied. An IV was inserted into the patient's left AC. Verbal informed consent was obtained.  *See table(s) above for TCD measurements and observations.    Preliminary    EP STUDY Result Date: 12/11/2023 See surgical note for result.  VAS [place] LOWER EXTREMITY VENOUS (DVT) Result Date: 12/10/2023  Lower Venous DVT Study Patient Name:  Devin Grenz [place] Jr.  Date of Exam:   12/10/2023 Medical Rec #: [id number]                Accession #:    [id number] Date of Birth: 02/02/1971                 Patient Gender: M Patient Age:   32 years Exam Location:  [facility] Procedure:      VAS [place] LOWER EXTREMITY VENOUS (DVT) Referring Phys: Scheryl Marten Vivian Okelley --------------------------------------------------------------------------------  Indications: Stroke.  Risk Factors: None identified. Comparison Study: No prior studies.  Performing Technologist: Chanda Busing RVT  Examination Guidelines: A complete evaluation includes B-mode imaging, spectral Doppler, color Doppler, and power Doppler as needed of all accessible portions of each vessel. Bilateral testing is considered an integral part of a complete examination. Limited examinations for reoccurring indications may be performed as noted. The reflux portion of the exam is performed with the patient in reverse Trendelenburg.  +---------+---------------+---------+-----------+----------+--------------+ RIGHT    CompressibilityPhasicitySpontaneityPropertiesThrombus Aging +---------+---------------+---------+-----------+----------+--------------+ CFV      Full           Yes      Yes                                 +---------+---------------+---------+-----------+----------+--------------+ SFJ      Full                                                        +---------+---------------+---------+-----------+----------+--------------+  FV Prox  Full                                                        +---------+---------------+---------+-----------+----------+--------------+ FV Mid   Full                                                        +---------+---------------+---------+-----------+----------+--------------+ FV DistalFull                                                        +---------+---------------+---------+-----------+----------+--------------+ PFV      Full                                                        +---------+---------------+---------+-----------+----------+--------------+ POP      Full           Yes      Yes                                 +---------+---------------+---------+-----------+----------+--------------+ PTV      Full                                                        +---------+---------------+---------+-----------+----------+--------------+ PERO     Full                                                         +---------+---------------+---------+-----------+----------+--------------+   +---------+---------------+---------+-----------+----------+--------------+ LEFT     CompressibilityPhasicitySpontaneityPropertiesThrombus Aging +---------+---------------+---------+-----------+----------+--------------+ CFV      Full           Yes      Yes                                 +---------+---------------+---------+-----------+----------+--------------+ SFJ      Full                                                        +---------+---------------+---------+-----------+----------+--------------+ FV Prox  Full                                                        +---------+---------------+---------+-----------+----------+--------------+  FV Mid   Full                                                        +---------+---------------+---------+-----------+----------+--------------+ FV DistalFull                                                        +---------+---------------+---------+-----------+----------+--------------+ PFV      Full                                                        +---------+---------------+---------+-----------+----------+--------------+ POP      Full           Yes      Yes                                 +---------+---------------+---------+-----------+----------+--------------+ PTV      Full                                                        +---------+---------------+---------+-----------+----------+--------------+ PERO     Full                                                        +---------+---------------+---------+-----------+----------+--------------+     Summary: RIGHT: - There is no evidence of deep vein thrombosis in the lower extremity.  - No cystic structure found in the popliteal fossa.  LEFT: - There is no evidence of deep vein thrombosis in the lower extremity.  - No cystic structure  found in the popliteal fossa.  *See table(s) above for measurements and observations. Electronically signed by Sherald Hess MD on 12/10/2023 at 4:10:14 PM.    Final    ECHOCARDIOGRAM COMPLETE Result Date: 12/10/2023    ECHOCARDIOGRAM REPORT   Patient Name:   Devin Robert Bur[place] Jr. Date of Exam: 12/10/2023 Medical Rec #:  [id number]               Height:       74.0 in Accession #:    [id number]              Weight:       265.4 lb Date of Birth:  [birth year]                BSA:          2.451 m Patient Age:    53 years                BP:           117/73 mmHg Patient Gender: M  HR:           64 bpm. Exam Location:  Inpatient Procedure: 2D Echo, Cardiac Doppler, Color Doppler and Intracardiac            Opacification Agent (Both Spectral and Color Flow Doppler were            utilized during procedure). Indications:    Stroke  History:        Patient has prior history of Echocardiogram examinations, most                 recent 09/15/2023. Cardiomyopathy and CHF, Previous Myocardial                 Infarction; Risk Factors:Hypertension.  Sonographer:    Amy Chionchio Referring Phys: [id number] MCNEILL P KIRKPATRICK IMPRESSIONS  1. Left ventricular ejection fraction, by estimation, is 45 to 50%. The left ventricle has mildly decreased function. The left ventricle demonstrates regional wall motion abnormalities (see scoring diagram/findings for description). There is mild left ventricular hypertrophy of the basal-septal segment. Left ventricular diastolic parameters are indeterminate. There is akinesis of the left ventricular, apical segment. There is hypokinesis of the left ventricular, apical septal wall, inferior wall and anterior wall.  2. Right ventricular systolic function is normal. The right ventricular size is mildly enlarged. Tricuspid regurgitation signal is inadequate for assessing PA pressure.  3. The mitral valve is normal in structure. Trivial mitral valve regurgitation. No evidence of  mitral stenosis. The mean mitral valve gradient is 1.0 mmHg.  4. The aortic valve is tricuspid. Aortic valve regurgitation is not visualized. Aortic valve sclerosis/calcification is present, without any evidence of aortic stenosis. Aortic valve area, by VTI measures 2.67 cm. Aortic valve mean gradient measures 3.0 mmHg. Aortic valve Vmax measures 1.12 m/s.  5. The inferior vena cava is normal in size with greater than 50% respiratory variability, suggesting right atrial pressure of 3 mmHg. Conclusion(s)/Recommendation(s): No intracardiac source of embolism detected on this transthoracic study. Consider a transesophageal echocardiogram to exclude cardiac source of embolism if clinically indicated. FINDINGS  Left Ventricle: Left ventricular ejection fraction, by estimation, is 45 to 50%. The left ventricle has mildly decreased function. The left ventricle demonstrates regional wall motion abnormalities. Strain imaging was not performed. The left ventricular  internal cavity size was normal in size. There is mild left ventricular hypertrophy of the basal-septal segment. Left ventricular diastolic parameters are indeterminate. Normal left ventricular filling pressure. Right Ventricle: The right ventricular size is mildly enlarged. No increase in right ventricular wall thickness. Right ventricular systolic function is normal. Tricuspid regurgitation signal is inadequate for assessing PA pressure. Left Atrium: Left atrial size was normal in size. Right Atrium: Right atrial size was normal in size. Pericardium: There is no evidence of pericardial effusion. Mitral Valve: The mitral valve is normal in structure. Trivial mitral valve regurgitation. No evidence of mitral valve stenosis. MV peak gradient, 1.8 mmHg. The mean mitral valve gradient is 1.0 mmHg. Tricuspid Valve: The tricuspid valve is normal in structure. Tricuspid valve regurgitation is not demonstrated. No evidence of tricuspid stenosis. Aortic Valve: The aortic  valve is tricuspid. Aortic valve regurgitation is not visualized. Aortic valve sclerosis/calcification is present, without any evidence of aortic stenosis. Aortic valve mean gradient measures 3.0 mmHg. Aortic valve peak gradient measures 5.0 mmHg. Aortic valve area, by VTI measures 2.67 cm. Pulmonic Valve: The pulmonic valve was normal in structure. Pulmonic valve regurgitation is not visualized. No evidence of pulmonic stenosis. Aorta: The aortic root is  normal in size and structure. Venous: The inferior vena cava is normal in size with greater than 50% respiratory variability, suggesting right atrial pressure of 3 mmHg. IAS/Shunts: No atrial level shunt detected by color flow Doppler. Additional Comments: 3D imaging was not performed.  LEFT VENTRICLE PLAX 2D LVIDd:         5.30 cm      Diastology LVIDs:         4.00 cm      LV e' medial:    7.51 cm/s LV PW:         1.10 cm      LV E/e' medial:  8.0 LV IVS:        1.20 cm      LV e' lateral:   13.50 cm/s LVOT diam:     2.20 cm      LV E/e' lateral: 4.4 LV SV:         66 LV SV Index:   27 LVOT Area:     3.80 cm  LV Volumes (MOD) LV vol d, MOD A2C: 203.0 ml LV vol d, MOD A4C: 159.0 ml LV vol s, MOD A2C: 84.5 ml LV vol s, MOD A4C: 83.7 ml LV SV MOD A2C:     118.5 ml LV SV MOD A4C:     159.0 ml LV SV MOD BP:      96.1 ml RIGHT VENTRICLE             IVC RV Basal diam:  4.30 cm     IVC diam: 1.80 cm RV Mid diam:    4.10 cm RV S prime:     12.70 cm/s TAPSE (M-mode): 2.4 cm LEFT ATRIUM             Index        RIGHT ATRIUM           Index LA Vol (A2C):   72.3 ml 29.49 ml/m  RA Area:     21.40 cm LA Vol (A4C):   42.7 ml 17.42 ml/m  RA Volume:   68.30 ml  27.86 ml/m LA Biplane Vol: 56.9 ml 23.21 ml/m  AORTIC VALVE                    PULMONIC VALVE AV Area (Vmax):    3.12 cm     PV Vmax:       1.01 m/s AV Area (Vmean):   2.62 cm     PV Peak grad:  4.1 mmHg AV Area (VTI):     2.67 cm AV Vmax:           112.00 cm/s AV Vmean:          84.900 cm/s AV VTI:             0.248 m AV Peak Grad:      5.0 mmHg AV Mean Grad:      3.0 mmHg LVOT Vmax:         91.90 cm/s LVOT Vmean:        58.600 cm/s LVOT VTI:          0.174 m LVOT/AV VTI ratio: 0.70  AORTA Ao Root diam: 3.50 cm Ao Asc diam:  3.50 cm MITRAL VALVE MV Area (PHT): 2.85 cm    SHUNTS MV Area VTI:   3.31 cm    Systemic VTI:  0.17 m MV Peak grad:  1.8 mmHg    Systemic Diam: 2.20 cm MV Mean grad:  1.0 mmHg MV Vmax:       0.68 m/s MV Vmean:      41.1 cm/s MV Decel Time: 266 msec MV E velocity: 59.90 cm/s MV A velocity: 43.10 cm/s MV E/A ratio:  1.39 Armanda Magic MD Electronically signed by Armanda Magic MD Signature Date/Time: 12/10/2023/11:37:03 AM    Final    MR BRAIN WO CONTRAST Result Date: 12/10/2023 CLINICAL DATA:  Follow-up examination for acute stroke. EXAM: MRI HEAD WITHOUT CONTRAST TECHNIQUE: Multiplanar, multiecho pulse sequences of the brain and surrounding structures were obtained without intravenous contrast. COMPARISON:  CTs from earlier the same day. FINDINGS: Brain: Cerebral volume within normal limits. No significant cerebral white matter disease. Abnormal restricted diffusion involving the anterior left insular cortex and overlying left frontal operculum, consistent with an acute left MCA distribution infarct (series 6, images 62-75). Mild petechial blood products at this location without hemorrhagic transformation or significant regional mass effect (series 12, image 42). Small underlying cystic focus within this region suspected to reflect a superimposed chronic right MCA distribution infarct (series 15, image 23). No other evidence for acute or subacute ischemia. No other acute or chronic intracranial blood products. No mass lesion, midline shift or mass effect. No hydrocephalus or extra-axial fluid collection. Pituitary gland and suprasellar region within normal limits. Vascular: Major intracranial vascular flow voids are maintained. Skull and upper cervical spine: Craniocervical junction within normal  limits. Bone marrow signal intensity normal. No scalp soft tissue abnormality. Sinuses/Orbits: Globes orbital soft tissues within normal limits. Mild scattered mucosal thickening noted about the frontoethmoidal sinuses. Trace right mastoid effusion noted, of doubtful significance. Other: None. IMPRESSION: 1. Acute left MCA distribution infarct involving the anterior left insular cortex and overlying left frontal operculum. Mild petechial blood products at this location without hemorrhagic transformation or significant regional mass effect. 2. Superimposed small chronic infarct involving the underlying left frontal operculum. 3. Otherwise normal brain MRI. Electronically Signed   By: Rise Mu M.D.   On: 12/10/2023 05:58   CT ANGIO HEAD NECK W WO CM (CODE STROKE) Result Date: 12/09/2023 CLINICAL DATA:  Initial evaluation for acute neuro deficit, stroke suspected. EXAM: CT ANGIOGRAPHY HEAD AND NECK WITH AND WITHOUT CONTRAST TECHNIQUE: Multidetector CT imaging of the head and neck was performed using the standard protocol during bolus administration of intravenous contrast. Multiplanar CT image reconstructions and MIPs were obtained to evaluate the vascular anatomy. Carotid stenosis measurements (when applicable) are obtained utilizing NASCET criteria, using the distal internal carotid diameter as the denominator. RADIATION DOSE REDUCTION: This exam was performed according to the departmental dose-optimization program which includes automated exposure control, adjustment of the mA and/or kV according to patient size and/or use of iterative reconstruction technique. CONTRAST:  75mL OMNIPAQUE IOHEXOL 350 MG/ML SOLN COMPARISON:  Head CT from earlier the same day. FINDINGS: CTA NECK FINDINGS Aortic arch: Visualized aortic arch within normal limits for caliber with standard branch pattern. No stenosis about the origin the great vessels. Mild aortic atherosclerosis. Right carotid system: No evidence of  dissection, stenosis (50% or greater), or occlusion. Left carotid system: No evidence of dissection, stenosis (50% or greater), or occlusion. Vertebral arteries: No evidence of dissection, stenosis (50% or greater), or occlusion. Skeleton: No discrete or worrisome osseous lesions. Other neck: No other acute finding. Upper chest: No other acute finding. Review of the MIP images confirms the above findings CTA HEAD FINDINGS Anterior circulation: Atheromatous change about the carotid siphons without hemodynamically significant stenosis. A1 segments, anterior communicating artery complex, and anterior cerebral  arteries patent without stenosis. No M1 stenosis or occlusion. Right MCA branches well perfused to their distal aspects. On the left, there is an acute occlusion of a distal M2/M3 branch (series 5, image 102). Remainder of the left MCA branches remain patent. Posterior circulation: Both vertebral arteries are patent to the vertebrobasilar junction without stenosis. Left vertebral artery dominant. Right PICA patent. Left PICA origin not well seen. Basilar patent without stenosis. Superior cerebral arteries patent bilaterally. Both PCAs primarily supplied via the basilar and are widely patent to their distal aspects. Venous sinuses: Patent allowing for timing the contrast bolus. Anatomic variants: None significant.  No aneurysm. Review of the MIP images confirms the above findings IMPRESSION: 1. Acute occlusion of a distal left M2/M3 branch, corresponding with the evolving left MCA distribution infarcts seen on prior CT. 2. Otherwise wide patency of the major arterial vasculature of the head and neck. No other hemodynamically significant or correctable stenosis. 3.  Aortic Atherosclerosis (ICD10-I70.0). These results were communicated to Dr. Amada Jupiter at 7:56 pm on 12/09/2023 by text page via the [facility] messaging system. Electronically Signed   By: Rise Mu M.D.   On: 12/09/2023 20:11   CT HEAD CODE  STROKE WO CONTRAST Result Date: 12/09/2023 CLINICAL DATA:  Code stroke. Initial evaluation for acute neuro deficit, stroke. EXAM: CT HEAD WITHOUT CONTRAST TECHNIQUE: Contiguous axial images were obtained from the base of the skull through the vertex without intravenous contrast. RADIATION DOSE REDUCTION: This exam was performed according to the departmental dose-optimization program which includes automated exposure control, adjustment of the mA and/or kV according to patient size and/or use of iterative reconstruction technique. COMPARISON:  None Available. FINDINGS: Brain: Cerebral volume within normal limits. Patchy hypodensity seen involving the anterior left insula and overlying left frontal lobe, concerning for evolving acute left MCA distribution infarct. Superimpose chronic infarct noted at this location as well. No acute intracranial hemorrhage. No mass lesion or midline shift. No hydrocephalus or extra-axial fluid collection. Vascular: No abnormal hyperdense vessel. Scattered vascular calcifications noted within the carotid siphons. Skull: Scalp soft tissues and calvarium within normal limits. Sinuses/Orbits: Globes and orbital soft tissues within normal limits. Paranasal sinuses and mastoid air cells are largely clear. Other: None. ASPECTS [facility] Stroke Program Early CT Score) - Ganglionic level infarction (caudate, lentiform nuclei, internal capsule, insula, M1-M3 cortex): 6 - Supraganglionic infarction (M4-M6 cortex): 2 Total score (0-10 with 10 being normal): 8 IMPRESSION: 1. Findings concerning for acute left MCA distribution infarct involving the anterior left insula and overlying left frontal lobe. No acute intracranial hemorrhage. 2. ASPECTS is 8. 3. Superimposed chronic left MCA territory infarct. These results were communicated to Dr. Amada Jupiter at 7:46 pm on 12/09/2023 by text page via the [facility] messaging system. Electronically Signed   By: Rise Mu M.D.   On: 12/09/2023 19:47        HISTORY OF PRESENT ILLNESS 53 y.o. patient with history of hypertension, hyperlipidemia, STEMI with PCI to his LAD now on Brilinta and aspirin  was admitted with abrupt onset of difficulty speaking this afternoon.  NIH on Admission 1   HOSPITAL COURSE Patient was admitted on 12/09/2023 for acute onset of difficulty speaking. His neurological exam had improved while in the emergency room and he was not given the acute treatment but admitted to the ICU for close neurological monitoring.  Code Stroke CT head  concerning for acute left MCA distribution infarct involving the anterior left insula and overlying left frontal lobe. CTA head & neck revealed acute  occlusion of a distal left M2/M3 branch. MRI brain revealed acute left MCA distribution infarct involving the anterior left insular cortex and overlying left frontal operculum with mild petechial blood products at this location.  Full stroke workup included: 2D Echo EF 45 to 50%.  LVH.  Right ventricle mildly enlarged TEE with EF 55%, Mild mitral valve regurgitation, Left Atrium: Normal, no left atrial appendage thrombus with no shunt TCD with bubble Hypercoagulable panel pending Loop recorder placed prior to discharge UDS positive for THC LDL 64, goal < 70, statin continue HgbA1c 5.0, for goal less than 7.0  He agreed to enroll in the [facility] stroke trial and will follow with [facility].   He was evaluated by therapy and required no needs.  He was deemed safe and appropriate for discharge.   Stroke:  left insular cortex and left frontal operculum patchy infarcts, etiology: Likely cardioembolic source Code Stroke  CT head  concerning for acute left MCA distribution infarct involving the anterior left insula and overlying left frontal lobe.  Aspects 8 CTA head & neck Acute occlusion of a distal left M2/M3 branch  MRI  Acute left MCA distribution infarct involving the anterior left insular cortex and overlying left  frontal operculum. Mild petechial blood products at this location LE Doppler no DVT 2D Echo EF 45 to 50%.  LVH.  Right ventricle mildly enlarged TCD with bubble no PFO TEE EF 55%, no PFO Loop recorder placed prior to discharge LDL 64 HgbA1c 5.0 UDS positive for THC Hypercoagulable lab pending (given stroke and STEMI in young, need to rule out APS) VTE prophylaxis -Lovenox aspirin 81 mg daily and Brilinta (ticagrelor) 90 mg bid prior to admission, now on aspirin 81 mg daily and Brilinta (ticagrelor) 90 mg bid.  Consented for OCEANIC trial, now on investigational meds daily Therapy recommendations:  No follow up needed  Disposition: Pending   History of STEMI x 2 CAD Ischemic cardiomyopathy STEMI with stent in 11/2020, was on aspirin and Brilinta for a year STEMI with stent in 08/2023, seen by Dr. Jacinto Halim, put on aspirin and Brilinta EF 60 to 65% in 08/2023 Current admission TTE EF 45 to 50% but TEE EF 55% Continue follow up with Dr. Jacinto Halim   Hypertension Home meds: Coreg 6.25 Mg, losartan 50 Mg, spironolactone 25 Mg Stable Long-term BP goal normotensive   Hyperlipidemia Home meds: Atorvastatin 40 mg and praluent, resumed in hospital LDL 64, goal < 70 Continue statin and praluent at discharge   Substance Abuse Patient used THC just once for fun recently. Not regular use at all UDS positive for  [facility]        Ready to quit? Yes TOC consult for cessation placed   Other Stroke Risk Factors Occasional ETOH use  Obesity, Body mass index is 34.08 kg/m., BMI >/= 30 associated with increased stroke risk, recommend weight loss, diet and exercise as appropriate    Other Active Problems AKI, creatinine 1.3--1.13   DISCHARGE EXAM  PHYSICAL EXAM General:  Alert, well-nourished, well-developed patient in no acute distress Psych:  Mood and affect appropriate for situation CV: Regular rate and rhythm on monitor Respiratory:  Regular, unlabored respirations on room air GI: Abdomen soft  and nontender  NEURO:  Mental Status: AA&Ox3  Speech/Language: speech is without dysarthria or aphasia.  Naming, repetition, fluency, and comprehension intact.  Cranial Nerves:  II: PERRL. Visual fields full.  III, IV, VI: EOMI. Eyelids elevate symmetrically.  V: Sensation is intact to light touch and symmetrical  to face.  VII: Smile is symmetrical.  VIII: hearing intact to voice. IX, X: Palate elevates symmetrically. Phonation is normal.  ZO:[id number] shrug 5/5. XII: tongue is midline without fasciculations. Motor: 5/5 strength to all muscle groups tested.  Tone: is normal and bulk is normal Sensation- Intact to light touch bilaterally. Extinction absent to light touch to DSS. Coordination: FTN intact bilaterally, HKS: no ataxia in BLE.No drift.  Gait- deferred  1a Level of Conscious.: 0 1b LOC Questions: 0 1c LOC Commands: 0 2 Best Gaze: 0 3 Visual: 0 4 Facial Palsy: 0 5a Motor Arm - left: 0 5b Motor Arm - Right: 0 6a Motor Leg - Left: 0 6b Motor Leg - Right: 0 7 Limb Ataxia: 0 8 Sensory: 0 9 Best Language: 0 10 Dysarthria: 0 11 Extinct. and Inatten.: 0 TOTAL: 0   Discharge Diet       Diet   Diet Heart Fluid consistency: Thin   liquids  DISCHARGE PLAN Disposition: Home aspirin 81 mg daily and Brilinta (ticagrelor) 90 mg bid for secondary stroke prevention  Ongoing stroke risk factor control by Primary Care Physician at time of discharge Follow-up PCP Merri Brunette, MD in 2 weeks. Follow-up in [facility] in 6 weeks, office to schedule an appointment. Able to see NP in clinic.  45 minutes were spent preparing discharge.   Gevena Mart DNP, ACNPC-AG  [facility]  ATTENDING NOTE: I reviewed above note and agree with the assessment and plan. Pt was seen and examined.   Wife at the bedside. Pt asymptomatic now. Had TEE today unremarkable, EF 55% no PFO. Had loop recorder placed. On ASA and brilinta with OCEANIC  investigational meds. Hypercoagulable labs pending given stroke and STEMI at young age.  Continue statin and Praluent as outpatient.  Continue follow-up with Dr. Jacinto Halim.  Will follow-up at [facility] in 6 weeks.  For detailed assessment and plan, please refer to above/below as I have made changes wherever appropriate.   Marvel Plan, MD PhD Stroke Neurology 12/11/2023 4:31 PM

## 2023-12-11 NOTE — Progress Notes (Signed)
 VASCULAR LAB    TCD bubble study has been performed.  See CV proc for preliminary results.   Exander Shaul, RVT 12/11/2023, 2:40 PM

## 2023-12-11 NOTE — Plan of Care (Signed)

## 2023-12-11 NOTE — Anesthesia Postprocedure Evaluation (Signed)
 Anesthesia Post Note  Patient: Devin Zylka Robert Burundi Jr.  Procedure(s) Performed: TRANSESOPHAGEAL ECHOCARDIOGRAM     Patient location during evaluation: Cath Lab Anesthesia Type: MAC Level of consciousness: awake and alert Pain management: pain level controlled Vital Signs Assessment: post-procedure vital signs reviewed and stable Respiratory status: spontaneous breathing, nonlabored ventilation and respiratory function stable Cardiovascular status: stable and blood pressure returned to baseline Postop Assessment: no apparent nausea or vomiting Anesthetic complications: no  No notable events documented.  Last Vitals:  Vitals:   12/11/23 0910 12/11/23 0920  BP: 107/79 115/72  Pulse: 72 67  Resp: 16 13  Temp:    SpO2: 95% 96%    Last Pain:  Vitals:   12/11/23 0904  TempSrc:   PainSc: 0-No pain                 Rashon Westrup,W. EDMOND

## 2023-12-11 NOTE — Transfer of Care (Signed)
 Immediate Anesthesia Transfer of Care Note  Patient: Devin Robert Burundi Jr.  Procedure(s) Performed: TRANSESOPHAGEAL ECHOCARDIOGRAM  Patient Location: Cath Lab  Anesthesia Type:MAC  Level of Consciousness: awake, drowsy, and patient cooperative  Airway & Oxygen Therapy: Patient Spontanous Breathing and Patient connected to nasal cannula oxygen  Post-op Assessment: Report given to RN, Post -op Vital signs reviewed and stable, and Patient moving all extremities X 4  Post vital signs: Reviewed and stable  Last Vitals:  Vitals Value Taken Time  BP 100/82   Temp 97.8   Pulse 74   Resp 22   SpO2 95%     Last Pain:  Vitals:   12/11/23 0742  TempSrc: Temporal  PainSc:          Complications: No notable events documented.

## 2023-12-11 NOTE — Research (Signed)
 PATIENT: Devin Robert Burundi Jr. DOB: [birth year]  Devin Hosteller Burundi Jr. is a 53 y.o. male who meets inclusion and exclusion criteria and may be a potential candidate for OCEANIC study.  A brief description of the study's purpose and duration was provided to see if the patient was interested in participating. The patient was told that study participation is voluntary, and if he choose not to participate, he will continue receiving the same treatment as any other patient.   Since the patient seemed interested in the trial, a copy of the informed consent was provided to him at  4pm yesterday and I said I would come back today to discuss the study in more detail after he had time to read the informed consent. I came back today at 11 am and brought him consent form to read and consider.   Detailed discussion  I returned at 12:30pm today to discuss the study in detail.  I requested he ask any questions he may have as we reviewed the informed consent so I could answer them as we went along. I discussed the standard-of-care treatments, appropriate alternative treatments, procedures or devices that might be helpful to the patients, the clinical trial, and each option's relative risks and benefits and alternatives. We discussed the research participant's bill of rights. Then, we also reviewed the ICF page by page and discussed their responsibilities if they choose to participate, any compensation, and what medical treatment is available if injury occurs. Devin Hosteller Burundi Jr. was encouraged to discuss study participation with family, significant others, and primary care attending or physician to help reduce the possibility of undue influence by talking to the investigator alone.  The patient was reminded that the study participation is voluntary, and if he choose not to participate, he will continue receiving the same care as any other patient. Additionally, if he choose to participate and later decide he wants to  withdraw, he will continue to be treated as any other patient. Devin Hosteller Burundi Jr. was told that he did not have to decide now but he has decided to proceed with the study  Devin Robert Burundi Jr. answered questions appropriately to verbalize understanding of the informed consent.   The subject agreed to participate in the [facility] stroke trial and signed the Research Participants Bill of Rights and the informed consent at 1pm today.  The informed consent was obtained prior to performance of any protocol-specific procedures for the subject.  A copy of the Research Participants Annette Stable of Rights and signed informed consent was given to the subject, and a copy placed in the subject's medical record.   Marvel Plan, MD PhD Stroke Neurology 12/11/2023 1:03 PM

## 2023-12-11 NOTE — TOC Transition Note (Signed)
 Transition of Care [facility]) - Discharge Note   Patient Details  Name: Devin Robert Burundi Jr. MRN: [id number] Date of Birth: 1971/06/11  Transition of [facility]) CM/SW Contact:  Kermit Balo, RN Phone Number: 12/11/2023, 1:41 PM   Clinical Narrative:     Pt is from home with his spouse. Sister is going to stay with him after dc for a few days.  Pt denies issues with home medications or transportation.  No follow up per PT.  Sister to transport home.  Final next level of care: Home/Self Care Barriers to Discharge: No Barriers Identified   Patient Goals and CMS Choice            Discharge Placement                       Discharge Plan and Services Additional resources added to the After Visit Summary for                                       Social Drivers of Health (SDOH) Interventions SDOH Screenings   Food Insecurity: No Food Insecurity (12/10/2023)  Housing: Low Risk  (12/10/2023)  Transportation Needs: No Transportation Needs (12/10/2023)  Utilities: Not At Risk (12/10/2023)  Depression (PHQ2-9): Low Risk  (10/02/2023)  Tobacco Use: Low Risk  (12/09/2023)     Readmission Risk Interventions     No data to display

## 2023-12-11 NOTE — Progress Notes (Addendum)
 AVS provided, discussed and reviewed with patient and sister at bedside.  Patient verbalized understanding wound care instructions.  Denies questions or concern.  PIV d/c'd with out complications.  Discharged home with sister via wheelchair.

## 2023-12-11 NOTE — Consult Note (Signed)
 ELECTROPHYSIOLOGY CONSULT NOTE  Patient ID: Devin Robert Burundi Jr. MRN: [id number], DOB/AGE: Mar 23, 1971   Admit date: 12/09/2023 Date of Consult: 12/11/2023  Primary Physician: Merri Brunette, MD Primary Cardiologist: Yates Decamp, MD  Primary Electrophysiologist: New to Dr. Nelly Laurence Reason for Consultation: Cryptogenic stroke; recommendations regarding Implantable Loop Recorder Insurance: [facility] ACO  History of Present Illness: 53 y/o M with PMH Of HTN, HLD, STEMI s/p PCI to LAD on Brilinta/ASA who presented to [facility] on 12/09/23 with abrupt onset difficulty speaking.  He was taken urgently for CT head which showed early CT changes consistent with LMCA distribution infarct.  Subsequent CTA Head/Neck showed Distal M2 occlusion. He was admitted for further stroke work up.   EP has been asked to evaluate Devin Robert Burundi Jr. for placement of an implantable loop recorder to monitor for atrial fibrillation by Dr Roda Shutters.     Imaging demonstrated: Code Stroke  CT head  concerning for acute left MCA distribution infarct involving the anterior left insula and overlying left frontal lobe.  Aspects 8 CTA head & neck Acute occlusion of a distal left M2/M3 branch  MRI  Acute left MCA distribution infarct involving the anterior left insular cortex and overlying left frontal operculum. Mild petechial blood products at this location.    He has undergone workup for stroke including:  Code Stroke  CT head  concerning for acute left MCA distribution infarct involving the anterior left insula and overlying left frontal lobe.  Aspects 8 CTA head & neck Acute occlusion of a distal left M2/M3 branch  MRI  Acute left MCA distribution infarct involving the anterior left insular cortex and overlying left frontal operculum. Mild petechial blood products at this location LE Doppler no DVT 2D Echo EF 45 to 50%.  LVH.  Right ventricle mildly enlarged LDL 64 HgbA1c 5.0 UDS positive for THC TCD Bubble study negative TEE >  LVEF 55%, no LAA thrombus, negative bubble study   The patient has been monitored on telemetry which has demonstrated sinus rhythm with no arrhythmias.  Inpatient stroke work-up will require a TEE per Neurology.   Echocardiogram as above. Lab work is reviewed.  Prior to admission, the patient denies chest pain, shortness of breath, dizziness, palpitations, or syncope.  He is recovering from his stroke with plans to return home  at discharge.  Allergies, Past Medical, Surgical, Social, and Family Histories have been reviewed and are referenced here-in when relevant for medical decision making.   Inpatient Medications:   aspirin EC  81 mg Oral Daily   atorvastatin  40 mg Oral Daily   Chlorhexidine Gluconate Cloth  6 each Topical Daily   OCEANIC-STROKE asundexian or placebo  1 tablet Oral Daily   ticagrelor  90 mg Oral BID    Physical Exam: Vitals:   12/11/23 0904 12/11/23 0910 12/11/23 0920 12/11/23 1152  BP: 114/77 107/79 115/72 118/74  Pulse: 72 72 67 65  Resp: 19 16 13 17   Temp:    98.3 F (36.8 C)  TempSrc:    Oral  SpO2: 95% 95% 96% 97%  Weight:      Height:        GEN- NAD. A&O x 3. Normal affect. HEENT: Normocephalic, atraumatic Lungs- CTAB, Normal effort.  Heart- Regular rate and rhythm rate and rhythm. No M/G/R.  Extremities- No peripheral edema. no clubbing or cyanosis Skin- warm and dry, no rash or lesion. Neuro: AAO x4, speech clear, MAE   12-lead ECG SR 70 bpm (personally reviewed) All  prior EKG's in EPIC reviewed with no documented atrial fibrillation  Telemetry NSR 70-80's predominantly, no AF > all tele reviewed since admit (personally reviewed)  Assessment and Plan:  1. Cryptogenic stroke The patient presents with cryptogenic stroke.  The patient underwent TEE this am which was negative for LAA thrombus and no shunt. I spoke at length with the patient about monitoring for afib with an implantable loop recorder.  Risks, benefits, and alteratives to  implantable loop recorder were discussed with the patient today.   At this time, the patient is very clear in their decision to proceed with implantable loop recorder.     Wound care was reviewed with the patient (keep incision clean and dry for 3 days). Please call with questions.     Canary Brim, NP-C, AGACNP-BC [facility] - Electrophysiology  12/11/2023, 2:48 PM

## 2023-12-11 NOTE — Progress Notes (Signed)
 SLP Cancellation Note  Patient Details Name: Devin Robert Burundi Jr. MRN: [id number] DOB: Sep 20, 1971   Cancelled treatment:       Reason Eval/Treat Not Completed: Patient at procedure or test/unavailable. Pt leaving the floor upon SLP arrival. Will f/u as able. If pt were to d/c before SLP can return, recommend considering OP SLP due to documentation of word-finding errors in chart.    Mahala Menghini., M.A. CCC-SLP Acute Rehabilitation Services Office [phone]  Secure chat preferred  12/11/2023, 2:01 PM

## 2023-12-11 NOTE — Progress Notes (Signed)
*  PRELIMINARY RESULTS* Echocardiogram Echocardiogram Transesophageal has been performed.  Devin Wright 12/11/2023, 9:06 AM

## 2023-12-11 NOTE — Plan of Care (Signed)
 Adequate for discharge.

## 2023-12-11 NOTE — CV Procedure (Signed)
   Transesophageal Echocardiogram  Indications: Stroke  Time out performed  During this procedure the patient was administered propofol under anesthesiology supervision to achieve and maintain moderate sedation.  The patient's heart rate, blood pressure, and oxygen saturation are monitored continuously during the procedure.   Findings:  Left Ventricle: Ejection fraction 55% normal  Mitral Valve: Mild mitral valve regurgitation, Pisa radius 0.3-0.4  Aortic Valve: Trileaflet, no regurgitation  Tricuspid Valve: Normal no regurgitation  Left Atrium: Normal, no left atrial appendage thrombus  Right Atrium: Normal  Intraatrial septum: Normal no shunting  Bubble Contrast Study: Normal, no shunting  Donato Schultz, MD

## 2023-12-12 ENCOUNTER — Encounter (HOSPITAL_COMMUNITY): Payer: BC Managed Care – PPO

## 2023-12-12 LAB — ECHO TEE
MV M vel: 5.6 m/s
MV Peak grad: 125.4 mm[Hg]
Radius: 0.6 cm

## 2023-12-12 LAB — CARDIOLIPIN ANTIBODIES, IGG, IGM, IGA
Anticardiolipin IgA: <9 [APL'U]/mL (ref 0–11)
Anticardiolipin IgG: <9 [GPL'U]/mL (ref 0–14)
Anticardiolipin IgM: <9 [MPL'U]/mL (ref 0–12)

## 2023-12-12 LAB — LUPUS ANTICOAGULANT PANEL
DRVVT: 35.3 s (ref 0.0–47.0)
PTT Lupus Anticoagulant: 31.5 s (ref 0.0–43.5)

## 2023-12-12 LAB — BETA-2-GLYCOPROTEIN I ABS, IGG/M/A
Beta-2 Glyco I IgG: <9 GPI IgG units (ref 0–20)
Beta-2-Glycoprotein I IgA: <9 GPI IgA units (ref 0–25)
Beta-2-Glycoprotein I IgM: <9 GPI IgM units (ref 0–32)

## 2023-12-12 LAB — HOMOCYSTEINE: Homocysteine: 31.5 umol/L — ABNORMAL HIGH (ref 0.0–14.5)

## 2023-12-13 ENCOUNTER — Telehealth: Payer: Self-pay

## 2023-12-13 ENCOUNTER — Telehealth (HOSPITAL_COMMUNITY): Payer: Self-pay | Admitting: *Deleted

## 2023-12-13 NOTE — Telephone Encounter (Signed)
 Devin Wright has decided to proceed with discharge from cardiac rehab.

## 2023-12-13 NOTE — Transitions of Care (Post Inpatient/ED Visit) (Signed)
 12/13/2023  Name: Devin Sedita Burundi Jr. MRN: [id number] DOB: 1971/01/24  Today's TOC FU Call Status:   Patient's Name and Date of Birth confirmed.  Transition Care Management Follow-up Telephone Call Date of Discharge: 12/11/23 Discharge Facility: [facility]) Type of Discharge: Inpatient Admission Primary Inpatient Discharge Diagnosis:: Stroke How have you been since you were released from the hospital?: Better ("I am doing really good"  Denies signs/symptoms acute stroke or residual functional impairments.) Any questions or concerns?: No  Items Reviewed: Did you receive and understand the discharge instructions provided?: Yes Medications obtained,verified, and reconciled?: Yes (Medications Reviewed) (Full medication reconciliation/ review completed; no concerns or discrepancies identified; confirmed patient has obtained and is taking all medications as instructed on discharge summary.) Any new allergies since your discharge?: No Dietary orders reviewed?: Yes Type of Diet Ordered:: low sodium, heart healthy Do you have support at home?: Yes People in Home: spouse, sibling(s)  Medications Reviewed Today: Medications Reviewed Today     Reviewed by Wyline Mood, RN (Case Manager) on 12/13/23 at 1525  Med List Status: <None>   Medication Order Taking? Sig Documenting Provider Last Dose Status Informant  aspirin 81 MG chewable tablet [id number] Yes Chew 1 tablet (81 mg total) by mouth daily. Arty Baumgartner, NP Taking Active Self, Child, Pharmacy Records  atorvastatin (LIPITOR) 40 MG tablet [id number] Yes Take 1 tablet (40 mg total) by mouth daily. Sharlene Dory, PA-C Taking Active Self, Child, Pharmacy Records  carvedilol (COREG) 6.25 MG tablet [id number] Yes Take 1 tablet (6.25 mg total) by mouth 2 (two) times daily with a meal. Sharlene Dory, PA-C Taking Active Self, Child, Pharmacy Records  losartan (COZAAR) 50 MG tablet [id number] Yes Take 1 tablet (50 mg total) by mouth  daily. Sharlene Dory, PA-C Taking Active Self, Child, Pharmacy Records  nitroGLYCERIN (NITROSTAT) 0.4 MG SL tablet [id number] Yes Place 1 tablet (0.4 mg total) under the tongue every 5 (five) minutes as needed for chest pain. Gaston Islam., NP Taking Active Self, Child, Pharmacy Records           Med Note Neil Crouch, SEBASTIAN   Sun Dec 09, 2023  8:50 PM) PRN, no recent need/use  PRALUENT 150 MG/ML SOAJ [id number] Yes Inject 1 pen under the skin every 14 days. Lyn Records, MD Taking Active Self, Child, Pharmacy Records  spironolactone (ALDACTONE) 25 MG tablet [id number] Yes Take 0.5 tablets (12.5 mg total) by mouth daily. Sharlene Dory, PA-C Taking Active Self, Child, Pharmacy Records  Study - OCEANIC-STROKE - asundexian 50 mg or placebo tablet (PI-Sethi) [id number] Yes Take 1 tablet (50 mg total) by mouth daily. For Investigational use only Mathews Argyle, NP Taking Active   ticagrelor (BRILINTA) 90 MG TABS tablet [id number] Yes Take 1 tablet (90 mg total) by mouth 2 (two) times daily. Sharlene Dory, PA-C Taking Active Self, Child, Pharmacy Records            Home Care and Equipment/Supplies: Were Home Health Services Ordered?: NA Any new equipment or medical supplies ordered?: NA  Functional Questionnaire: Do you need assistance with bathing/showering or dressing?: No Do you need assistance with meal preparation?: No Do you need assistance with eating?: No Do you have difficulty maintaining continence: No Do you need assistance with getting out of bed/getting out of a chair/moving?: No Do you have difficulty managing or taking your medications?: No  Follow up appointments reviewed: PCP Follow-up appointment confirmed?: Yes Date of PCP follow-up appointment?: 12/19/23 Follow-up  Provider: Dr. Carolin Guernsey Assistant [facility] Follow-up appointment confirmed?: No Reason Specialist Follow-Up Not Confirmed: Patient has Specialist Provider Number and will Call for  Appointment Do you need transportation to your follow-up appointment?: No Do you understand care options if your condition(s) worsen?: Yes-patient verbalized understanding  SDOH Interventions Today    Flowsheet Row Most Recent Value  SDOH Interventions   Food Insecurity Interventions Intervention Not Indicated  Housing Interventions Intervention Not Indicated  Transportation Interventions Intervention Not Indicated  Utilities Interventions Intervention Not Indicated      Interventions Today    Flowsheet Row Most Recent Value  Chronic Disease   Chronic disease during today's visit Other  [Stroke]  General Interventions   General Interventions Discussed/Reviewed General Interventions Discussed, General Interventions Reviewed, Health Screening, Doctor Visits  Doctor Visits Discussed/Reviewed Doctor Visits Discussed, Doctor Visits Reviewed, PCP, Specialist  [Patient has Neurologist provider phone number and will call for  follow up appointment.]  PCP/Specialist Visits Compliance with follow-up visit  Exercise Interventions   Exercise Discussed/Reviewed --  Jaquita Folds activity slowly,  avoid straining or any activity that causes chest pain, shortness of breath or dizziness.]  Physical Activity Discussed/Reviewed Physical Activity Discussed, Physical Activity Reviewed  Education Interventions   Education Provided Provided Education  Provided Verbal Education On Nutrition, Medication, When to see the doctor, Insurance Plans  [Loop Monitoring function, aftercare and when to call doctor.]  Nutrition Interventions   Nutrition Discussed/Reviewed Decreasing fats, Decreasing salt, Nutrition Reviewed  Pharmacy Interventions   Pharmacy Dicussed/Reviewed Medications and their functions, Medication Adherence       TOC Interventions Today    Flowsheet Row Most Recent Value  TOC Interventions   TOC Interventions Discussed/Reviewed TOC Interventions Discussed, TOC Interventions Reviewed, Post  discharge activity limitations per provider, S/S of infection      Reviewed/discussed TOC 30 Day Program.  Patient declines enrollment at this time.  No unmet or acute needs identified. RNCM contact information provided for any health-related questions or concerns that may arise after today's telephone call.  Wyline Mood BSN, RN Medical illustrator, Transition of Care [place] [facility], Population Health Direct Dial:  [phone] / Fax: [phone] Email:  [email or web address]

## 2023-12-14 ENCOUNTER — Encounter (HOSPITAL_COMMUNITY): Payer: BC Managed Care – PPO

## 2023-12-14 ENCOUNTER — Encounter (HOSPITAL_COMMUNITY): Payer: Self-pay | Admitting: Cardiology

## 2023-12-14 DIAGNOSIS — I2111 ST elevation (STEMI) myocardial infarction involving right coronary artery: Secondary | ICD-10-CM

## 2023-12-14 DIAGNOSIS — Z955 Presence of coronary angioplasty implant and graft: Secondary | ICD-10-CM

## 2023-12-14 NOTE — Progress Notes (Signed)
 Discharge Progress Report  Patient Details  Name: Devin Robert Burundi Jr. MRN: [id number] Date of Birth: 12-28-1970 Referring Provider:   Flowsheet Row INTENSIVE CARDIAC REHAB ORIENT from 10/02/2023 in [facility]  Referring Provider Yates Decamp, MD        Number of Visits: 9 exercise sessions, 4 education sessions  Reason for Discharge:  Early Exit:  Primary reason- a great deal of out of town and out of state travel for work . See note below.  Smoking History:  Social History   Tobacco Use  Smoking Status Never  Smokeless Tobacco Never    Diagnosis:  09/14/23 STEMI  09/14/23 DES d RCA  ADL UCSD:   Initial Exercise Prescription:   Discharge Exercise Prescription (Final Exercise Prescription Changes):  Exercise Prescription Changes - 10/26/23 0841       Response to Exercise   Blood Pressure (Admit) 102/70    Blood Pressure (Exercise) 126/64    Blood Pressure (Exit) 96/56    Heart Rate (Admit) 72 bpm    Heart Rate (Exercise) 110 bpm    Heart Rate (Exit) 84 bpm    Rating of Perceived Exertion (Exercise) 12.5    Perceived Dyspnea (Exercise) 0    Symptoms 0    Comments Reviewed MET's, goals and home ExRx    Duration Progress to 30 minutes of  aerobic without signs/symptoms of physical distress    Intensity THRR unchanged      Progression   Progression Continue to progress workloads to maintain intensity without signs/symptoms of physical distress.    Average METs 5.68      Resistance Training   Training Prescription Yes    Weight 5 lbs    Reps 10-15    Time 10 Minutes      Bike   Level 4    Watts 105    Minutes 15    METs 4.5      Rower   Level 3    Watts 126    Minutes 15    METs 6.86      Home Exercise Plan   Plans to continue exercise at Home (comment)    Frequency Add 2 additional days to program exercise sessions.    Initial Home Exercises Provided 10/26/23              Functional Capacity:   Psychological, QOL, Others - Outcomes: PHQ 2/9:    10/02/2023    9:12 AM  Depression screen PHQ 2/9  Decreased Interest 1  Down, Depressed, Hopeless 0  PHQ - 2 Score 1  Altered sleeping 0  Tired, decreased energy 0  Change in appetite 0  Feeling bad or failure about yourself  1  Trouble concentrating 0  Moving slowly or fidgety/restless 0  Suicidal thoughts 0  PHQ-9 Score 2  Difficult doing work/chores Not difficult at all    Quality of Life:   Personal Goals: Goals established at orientation with interventions provided to work toward goal.    Personal Goals Discharge:  Goals and Risk Factor Review     Row Name 10/25/23 1105 11/22/23 0813           Core Components/Risk Factors/Patient Goals Review   Personal Goals Review Weight Management/Obesity;Lipids;Hypertension Weight Management/Obesity;Lipids;Hypertension      Review Devin Wright is off to a good start to exercise at cardiac rehab. . Vital signs have been stable. Devin Wright is doing well with  exercise at cardiac rehab. . Vital  signs have been stable. Devin Wright has lost 2.1 kg since starting cardiac rehab. Devin Wright is currently out due to business travel      Expected Outcomes Devin Wright will continue to participate in cardiac rehab for exercise, nutrition and lifestyle modifications Devin Wright will continue to participate in cardiac rehab for exercise, nutrition and lifestyle modifications               Exercise Goals and Review:   Exercise Goals Re-Evaluation:  Exercise Goals Re-Evaluation     Row Name 10/26/23 0845             Exercise Goal Re-Evaluation   Exercise Goals Review Increase Physical Activity;Understanding of Exercise Prescription;Increase Strength and Stamina;Knowledge and understanding of Target Heart Rate Range (THRR);Able to understand and use rate of perceived exertion (RPE) scale       Comments Reviewed MET's, goals and home ExRx. Pt tolerated exercise well with an  average MET level of 5.68, Pt feels good about his goals and has a plan to get on track now that the holidays are over. He may also meet with the RD to come up with a plan. Pt Will continue to exercise in his home gym: Peloton, TM, WTS, rower and situp machine, for 30-45 mins 2 days a week. Also talked over gradual wt progression       Expected Outcomes Will continue to monitor pt and progress workloads as tolerated without sign or symptom                Nutrition & Weight - Outcomes:    Nutrition:   Nutrition Discharge:  Nutrition Assessments - 11/02/23 0748       Rate Your Plate Scores   Pre Score 60             Education Questionnaire Score:  NOTE:  Devin Wright has not been able to attend cardiac rehab since January 10th.  Majority of misses were due to out of town and out of state work. The week of February 10th he had planned to return to cardiac rehab but was out due to having the flu. On Sunday February 16th he was admitted to the hospital and discharged 12/11/23 with primary diagnosis stroke.  Spoke to [place] on February 20th regarding return to complete cardiac rehab. Due to upcoming work commitments, he has decided to discharge from cardiac rehab program. Devin Wright will continue with his home exercise plan and has a very good understanding of his exercise regimen as well as goals he still wants to achieve. He will reach out to Dr. Jacinto Halim if he has a need to be referred again to cardiac rehab.  Dr. Armanda Magic - Medical Director for Cardiac rehab

## 2023-12-17 ENCOUNTER — Encounter (HOSPITAL_COMMUNITY): Payer: BC Managed Care – PPO

## 2023-12-19 ENCOUNTER — Encounter (HOSPITAL_COMMUNITY): Payer: BC Managed Care – PPO

## 2023-12-21 ENCOUNTER — Encounter (HOSPITAL_COMMUNITY): Payer: BC Managed Care – PPO

## 2023-12-21 ENCOUNTER — Encounter: Payer: Self-pay | Admitting: Cardiology

## 2023-12-21 DIAGNOSIS — I252 Old myocardial infarction: Secondary | ICD-10-CM | POA: Diagnosis not present

## 2023-12-21 DIAGNOSIS — Z8673 Personal history of transient ischemic attack (TIA), and cerebral infarction without residual deficits: Secondary | ICD-10-CM | POA: Diagnosis not present

## 2023-12-21 DIAGNOSIS — E7211 Homocystinuria: Secondary | ICD-10-CM | POA: Diagnosis not present

## 2023-12-21 LAB — LAB REPORT - SCANNED: EGFR (Non-African Amer.): 75

## 2023-12-24 ENCOUNTER — Encounter (HOSPITAL_COMMUNITY): Payer: BC Managed Care – PPO

## 2023-12-26 ENCOUNTER — Encounter (HOSPITAL_COMMUNITY): Payer: BC Managed Care – PPO

## 2024-01-11 ENCOUNTER — Encounter: Payer: Self-pay | Admitting: Physician Assistant

## 2024-01-11 ENCOUNTER — Ambulatory Visit: Payer: BC Managed Care – PPO | Admitting: Cardiology

## 2024-01-15 ENCOUNTER — Ambulatory Visit: Attending: Cardiology | Admitting: Cardiology

## 2024-01-15 ENCOUNTER — Encounter: Payer: Self-pay | Admitting: Cardiology

## 2024-01-15 VITALS — BP 104/62 | HR 74 | Ht 74.0 in | Wt 271.8 lb

## 2024-01-15 DIAGNOSIS — R29818 Other symptoms and signs involving the nervous system: Secondary | ICD-10-CM | POA: Diagnosis not present

## 2024-01-15 DIAGNOSIS — I2510 Atherosclerotic heart disease of native coronary artery without angina pectoris: Secondary | ICD-10-CM

## 2024-01-15 DIAGNOSIS — E785 Hyperlipidemia, unspecified: Secondary | ICD-10-CM | POA: Diagnosis not present

## 2024-01-15 DIAGNOSIS — E7841 Elevated Lipoprotein(a): Secondary | ICD-10-CM | POA: Diagnosis not present

## 2024-01-15 DIAGNOSIS — I10 Essential (primary) hypertension: Secondary | ICD-10-CM | POA: Diagnosis not present

## 2024-01-15 DIAGNOSIS — I639 Cerebral infarction, unspecified: Secondary | ICD-10-CM | POA: Diagnosis not present

## 2024-01-15 NOTE — Progress Notes (Signed)
 Cardiology Office Note:  .   Date:  01/15/2024  ID:  Devin Robert Burundi Jr., DOB 06/03/1971, MRN [id number] PCP: Merri Brunette, MD  [facility] Providers Cardiologist:  Yates Decamp, MD   History of Present Illness: .   Devin Stetzer Burundi Jr. is a 53 y.o. ale with a past medical history of hypercholesteremia, hypertension, elevated LP(a), anterior wall STEMI on 12/21/2018 status post angioplasty and stenting of mid LAD, presenting with acute inferior STEMI on 09/15/2023 and underwent stenting to distal RCA.  He now presents for a 6-week visit, last seen in our office on 09/27/2023.  He presented to the emergency room on 12/09/2023 with left insular cortex and left frontal operculum embolic infarct, TEE was negative for any cardiac source of cerebral emboli, no intracardiac shunting and TCD bubble study was also negative for intracardiac shunting.  Underwent loop recorder implantation 12/11/2023 to evaluate for cardiac source of cerebral emboli and discharged home.  He now presents for follow-up.  He was also enrolled in [place] clinical trial with Asundexian (Factor XI a inhibitor anticoagulant 50 mg versus placebo for secondary stroke prevention. Discussed the use of AI scribe software for clinical note transcription with the patient, who gave verbal consent to proceed.  History of Present Illness   The patient, with a history of two heart attacks and a recent stroke, presents for follow-up. He reports a significant speech difficulty during the stroke event, being able to say only two or three words. He has been working on weight loss, currently weighing around 264 lbs, with a goal of reaching 210 lbs this year. He has tried [place] for weight loss in the past, but experienced severe leg cramping and discontinued its use. He is currently managing his weight through diet, focusing on protein, salad, and vegetables, and limiting red meat to once a month. He also reports occasional ocular migraines,  with a recent episode causing temporary loss of vision on the right side.      Labs   Lab Results  Component Value Date   CHOL 124 12/09/2023   HDL 28 (L) 12/09/2023   LDLCALC 64 12/09/2023   LDLDIRECT 72 07/25/2019   TRIG 159 (H) 12/09/2023   CHOLHDL 4.4 12/09/2023   Lab Results  Component Value Date   NA 140 12/11/2023   K 4.1 12/11/2023   CO2 24 12/11/2023   GLUCOSE 93 12/11/2023   BUN 12 12/11/2023   CREATININE 1.13 12/11/2023   CALCIUM 8.6 (L) 12/11/2023   EGFR 81 10/19/2022   GFRNONAA 75 12/21/2023      Latest Ref Rng & Units 12/11/2023    6:05 AM 12/09/2023   11:06 PM 12/09/2023    7:32 PM  BMP  Glucose 70 - 99 mg/dL 93   119    94   BUN 6 - 20 mg/dL 12   14    16    Creatinine 0.61 - 1.24 mg/dL 1.47  8.29  5.62    1.30   Sodium 135 - 145 mmol/L 140   138    141   Potassium 3.5 - 5.1 mmol/L 4.1   4.0    4.0   Chloride 98 - 111 mmol/L 105   109    106   CO2 22 - 32 mmol/L 24   24   Calcium 8.9 - 10.3 mg/dL 8.6   8.4       Latest Ref Rng & Units 12/11/2023    6:05 AM 12/09/2023   11:06 PM  12/09/2023    7:32 PM  CBC  WBC 4.0 - 10.5 K/uL 6.0  8.9  7.4   Hemoglobin 13.0 - 17.0 g/dL 40.1  02.7  25.3    66.4   Hematocrit 39.0 - 52.0 % 45.2  49.1  46.2    45.0   Platelets 150 - 400 K/uL 274  282  268    Lab Results  Component Value Date   HGBA1C 4.9 12/11/2023      No results found for: "TSH"   Review of Systems  Cardiovascular:  Negative for chest pain, dyspnea on exertion and leg swelling.   Physical Exam:   VS:  BP 104/62   Pulse 74   Ht 6\' 2"  (1.88 m)   Wt 271 lb 12.8 oz (123.3 kg)   SpO2 95%   BMI 34.90 kg/m    Wt Readings from Last 3 Encounters:  01/15/24 271 lb 12.8 oz (123.3 kg)  12/09/23 265 lb 6.9 oz (120.4 kg)  10/02/23 280 lb 13.9 oz (127.4 kg)    Physical Exam Neck:     Vascular: No carotid bruit or JVD.  Cardiovascular:     Rate and Rhythm: Normal rate and regular rhythm.     Pulses: Intact distal pulses.     Heart  sounds: Normal heart sounds. No murmur heard.    No gallop.  Pulmonary:     Effort: Pulmonary effort is normal.     Breath sounds: Normal breath sounds.  Abdominal:     General: Bowel sounds are normal.     Palpations: Abdomen is soft.  Musculoskeletal:     Right lower leg: No edema.     Left lower leg: No edema.    Studies Reviewed: Marland Kitchen    Left Heart Catheterization and angioplasty 09/14/23:  Distal RCA: 3.5 x 24 mm Synergy XD DES  3.0 x 32 mm Synergy DES placed in the mid LAD is widely patent from 12/21/2018.    ECHOCARDIOGRAM COMPLETE 12/10/2023  1. Left ventricular ejection fraction, by estimation, is 45 to 50%. The left ventricle has mildly decreased function. The left ventricle demonstrates regional wall motion abnormalities (see scoring diagram/findings for description). There is mild left ventricular hypertrophy of the basal-septal segment. Left ventricular diastolic parameters are indeterminate. There is akinesis of the left ventricular, apical segment. There is hypokinesis of the left ventricular, apical septal wall, inferior wall and anterior wall. 2. Right ventricular systolic function is normal. The right ventricular size is mildly enlarged. Tricuspid regurgitation signal is inadequate for assessing PA pressure. 3. The mitral valve is normal in structure. Trivial mitral valve regurgitation. No evidence of mitral stenosis. The mean mitral valve gradient is 1.0 mmHg. 4. The aortic valve is tricuspid. Aortic valve regurgitation is not visualized. Aortic valve sclerosis/calcification is present, without any evidence of aortic stenosis. Aortic valve area, by VTI measures 2.67 cm. Aortic valve mean gradient measures 3.0 mmHg. Aortic valve Vmax measures 1.12 m/s. 5. The inferior vena cava is normal in size with greater than 50% respiratory variability, suggesting right atrial pressure of 3 mmHg.   Conclusion(s)/Recommendation(s): No intracardiac source of embolism detected on this  transthoracic study. Consider a transesophageal echocardiogram to exclude cardiac source of embolism if clinically indicated.  TEE 12/11/2023: 1. Left ventricular ejection fraction, by estimation, is 55 to 60%. The left ventricle has normal function. The left ventricle has no regional wall motion abnormalities.  2. Right ventricular systolic function is normal. The right ventricular size is normal.  3. No left  atrial/left atrial appendage thrombus was detected.  4. The mitral valve is normal in structure. Mild mitral valve regurgitation. No evidence of mitral stenosis.  5. The aortic valve is normal in structure. Aortic valve regurgitation is not visualized. No aortic stenosis is present.  6. The inferior vena cava is normal in size with greater than 50% respiratory variability, suggesting right atrial pressure of 3 mmHg.  7. Agitated saline contrast bubble study was negative, with no evidence of any interatrial shunt.  8. 3D performed of the mitral valve and demonstrates Normal leaflets.   EKG:    EKG Interpretation Date/Time:  Tuesday January 15 2024 15:24:30 EDT Ventricular Rate:  74 PR Interval:  154 QRS Duration:  86 QT Interval:  394 QTC Calculation: 437 R Axis:   -74  Text Interpretation: EKG 01/15/2024: Normal sinus rhythm with rate of 74 bpm, inferior infarct old.  Anterolateral infarct old.  No evidence of ischemia.  Compared to 12/09/2023, Q waves inferior leads new. Confirmed by Delrae Rend [phone]) on 01/15/2024 9:54:34 PM    Medications and allergies    No Known Allergies   Current Outpatient Medications:    aspirin 81 MG chewable tablet, Chew 1 tablet (81 mg total) by mouth daily., Disp: 90 tablet, Rfl: 2   atorvastatin (LIPITOR) 40 MG tablet, Take 1 tablet (40 mg total) by mouth daily., Disp: 90 tablet, Rfl: 3   carvedilol (COREG) 6.25 MG tablet, Take 1 tablet (6.25 mg total) by mouth 2 (two) times daily with a meal., Disp: 180 tablet, Rfl: 3   losartan (COZAAR) 50 MG  tablet, Take 1 tablet (50 mg total) by mouth daily., Disp: 90 tablet, Rfl: 3   nitroGLYCERIN (NITROSTAT) 0.4 MG SL tablet, Place 1 tablet (0.4 mg total) under the tongue every 5 (five) minutes as needed for chest pain., Disp: 75 tablet, Rfl: 2   PRALUENT 150 MG/ML SOAJ, Inject 1 pen under the skin every 14 days., Disp: 6 mL, Rfl: 2   spironolactone (ALDACTONE) 25 MG tablet, Take 0.5 tablets (12.5 mg total) by mouth daily., Disp: 45 tablet, Rfl: 3   Study - OCEANIC-STROKE - asundexian 50 mg or placebo tablet (PI-Sethi), Take 1 tablet (50 mg total) by mouth daily. For Investigational use only, Disp: 98 tablet, Rfl: 0   ticagrelor (BRILINTA) 90 MG TABS tablet, Take 1 tablet (90 mg total) by mouth 2 (two) times daily., Disp: 180 tablet, Rfl: 3   ASSESSMENT AND PLAN: .      ICD-10-CM   1. Coronary artery disease involving native coronary artery of native heart without angina pectoris  I25.10 EKG 12-Lead    TSH    2. Essential hypertension  I10 TSH    3. Hyperlipidemia LDL goal <70  E78.5 Lipoprotein A (LPA)    4. Elevated Lp(a)  E78.41       No orders of the defined types were placed in this encounter.   There are no discontinued medications.   Assessment and Plan    Stroke   He experienced a stroke affecting the left frontal and left insular cortex, resulting in speech difficulty and inability to articulate thoughts. The etiology is unclear, but atrial fibrillation is suspected despite no history of it. He is enrolled in the [place] clinical trial for stroke prevention with Asundexian and placebo and is on a blood thinner prophylactically. Preventing future strokes is crucial. Continue participation in the clinical trial. Monitor with a loop recorder for potential atrial fibrillation. Order LPA and TSH tests.  Coronary  Artery Disease   He has had two myocardial infarctions, but heart function is normal to mildly depressed based on echocardiogram and TEE, with no intracardiac thrombi. He is  on appropriate medications and advised to focus on weight loss to reduce cardiovascular risk. Continue current cardiac medications and focus on weight loss.  Hyperlipidemia   Triglycerides are elevated at 159 mg/dL, while LDL is well-controlled at 64 mg/dL. Triglycerides are influenced by diet, and LDL is genetically determined. He is on Praluent for cholesterol management, which reduces LPA levels to some degree.  Will recheck Lp(a).  Consider switching from Praluent to Repatha or explore enrollment in a clinical trial for LPA management.    Hypertension   Blood pressure is well-controlled, even during cardiac rehab, with no symptoms of lightheadedness. Continue the current antihypertensive regimen.  Obesity   He is obese, currently weighing approximately 264 pounds, down from 259 pounds. Previous weight loss attempts with [facility] were unsuccessful due to severe leg cramps. Weight loss is important due to high cardiovascular risk, including two myocardial infarctions and one stroke. He is advised to aim for a weight of 175-180 pounds. Discussed potential use of GLP-1 agonists, including oral forms, and other appetite-suppressing medications. Currently focusing on dietary changes and exercise for weight loss. Emphasized the importance of mindset and gradual weight loss to prevent reverting to old habits. Encourage a diet of protein, salad, and vegetables with limited red meat intake. Aim for a 5-6 pound weight loss per month. Consider oral GLP-1 agonists or other appetite-suppressing medications if dietary changes are insufficient. Maintain a daily caloric intake of 1500 calories, with meals of approximately 500 calories each.   Total time spent direct face-to-face, evaluation of medical records from recent hospitalization, evaluation of recommendation from other consultants and labs was 40 minutes.    Signed,  Yates Decamp, MD, [facility] 01/15/2024, 9:55 PM [facility] [address] [place], [place] [address] Phone: [phone]. Fax:  [phone]

## 2024-01-15 NOTE — Patient Instructions (Signed)
 Medication Instructions:  Your physician recommends that you continue on your current medications as directed. Please refer to the Current Medication list given to you today.  *If you need a refill on your cardiac medications before your next appointment, please call your pharmacy*   Lab Work: Have Lp(a) and TSH checked today at [facility] on the first floor If you have labs (blood work) drawn today and your tests are completely normal, you will receive your results only by: MyChart Message (if you have MyChart) OR A paper copy in the mail If you have any lab test that is abnormal or we need to change your treatment, we will call you to review the results.   Testing/Procedures: none   Follow-Up: At [facility], you and your health needs are our priority.  As part of our continuing mission to provide you with exceptional heart care, we have created designated Provider Care Teams.  These Care Teams include your primary Cardiologist (physician) and Advanced Practice Providers (APPs -  Physician Assistants and Nurse Practitioners) who all work together to provide you with the care you need, when you need it.  We recommend signing up for the patient portal called "MyChart".  Sign up information is provided on this After Visit Summary.  MyChart is used to connect with patients for Virtual Visits (Telemedicine).  Patients are able to view lab/test results, encounter notes, upcoming appointments, etc.  Non-urgent messages can be sent to your provider as well.   To learn more about what you can do with MyChart, go to [email or web address].    Your next appointment:   12 month(s)  Provider:   Yates Decamp, MD     Other Instructions    1st Floor: - Lobby - Registration  - Pharmacy  - Lab - Cafe  2nd Floor: - PV Lab - Diagnostic Testing (echo, CT, nuclear med)  3rd Floor: - Vacant  4th Floor: - TCTS (cardiothoracic surgery) - AFib Clinic - Structural Heart Clinic -  Vascular Surgery  - Vascular Ultrasound  5th Floor: - [facility] (general and EP) - Clinical Pharmacy for coumadin, hypertension, lipid, weight-loss medications, and med management appointments    Valet parking services will be available as well.

## 2024-01-16 ENCOUNTER — Other Ambulatory Visit: Payer: Self-pay

## 2024-01-16 ENCOUNTER — Emergency Department (HOSPITAL_COMMUNITY)

## 2024-01-16 ENCOUNTER — Inpatient Hospital Stay (HOSPITAL_COMMUNITY)

## 2024-01-16 ENCOUNTER — Encounter (HOSPITAL_COMMUNITY): Payer: Self-pay | Admitting: Radiology

## 2024-01-16 ENCOUNTER — Ambulatory Visit

## 2024-01-16 ENCOUNTER — Encounter: Payer: Self-pay | Admitting: Cardiology

## 2024-01-16 ENCOUNTER — Observation Stay (HOSPITAL_COMMUNITY)
Admission: EM | Admit: 2024-01-16 | Discharge: 2024-01-17 | Disposition: A | Discharge status: Home / Self Care | Attending: Neurology | Admitting: Neurology

## 2024-01-16 DIAGNOSIS — I2510 Atherosclerotic heart disease of native coronary artery without angina pectoris: Secondary | ICD-10-CM

## 2024-01-16 DIAGNOSIS — E785 Hyperlipidemia, unspecified: Secondary | ICD-10-CM | POA: Diagnosis present

## 2024-01-16 DIAGNOSIS — F1010 Alcohol abuse, uncomplicated: Secondary | ICD-10-CM

## 2024-01-16 DIAGNOSIS — E669 Obesity, unspecified: Secondary | ICD-10-CM | POA: Diagnosis present

## 2024-01-16 DIAGNOSIS — R29818 Other symptoms and signs involving the nervous system: Secondary | ICD-10-CM | POA: Diagnosis not present

## 2024-01-16 DIAGNOSIS — I63311 Cerebral infarction due to thrombosis of right middle cerebral artery: Secondary | ICD-10-CM | POA: Diagnosis not present

## 2024-01-16 DIAGNOSIS — I700 Atherosclerosis of aorta: Secondary | ICD-10-CM | POA: Diagnosis not present

## 2024-01-16 DIAGNOSIS — I63511 Cerebral infarction due to unspecified occlusion or stenosis of right middle cerebral artery: Secondary | ICD-10-CM | POA: Diagnosis not present

## 2024-01-16 DIAGNOSIS — Z79899 Other long term (current) drug therapy: Secondary | ICD-10-CM | POA: Diagnosis not present

## 2024-01-16 DIAGNOSIS — Z7982 Long term (current) use of aspirin: Secondary | ICD-10-CM | POA: Insufficient documentation

## 2024-01-16 DIAGNOSIS — K4090 Unilateral inguinal hernia, without obstruction or gangrene, not specified as recurrent: Secondary | ICD-10-CM | POA: Diagnosis not present

## 2024-01-16 DIAGNOSIS — F1210 Cannabis abuse, uncomplicated: Secondary | ICD-10-CM

## 2024-01-16 DIAGNOSIS — R29701 NIHSS score 1: Secondary | ICD-10-CM | POA: Diagnosis not present

## 2024-01-16 DIAGNOSIS — I428 Other cardiomyopathies: Secondary | ICD-10-CM | POA: Diagnosis not present

## 2024-01-16 DIAGNOSIS — I10 Essential (primary) hypertension: Secondary | ICD-10-CM | POA: Diagnosis present

## 2024-01-16 DIAGNOSIS — I639 Cerebral infarction, unspecified: Principal | ICD-10-CM

## 2024-01-16 DIAGNOSIS — I63522 Cerebral infarction due to unspecified occlusion or stenosis of left anterior cerebral artery: Secondary | ICD-10-CM | POA: Diagnosis not present

## 2024-01-16 DIAGNOSIS — I69391 Dysphagia following cerebral infarction: Secondary | ICD-10-CM

## 2024-01-16 LAB — CBC
HCT: 47.1 % (ref 39.0–52.0)
HCT: 48.8 % (ref 39.0–52.0)
Hemoglobin: 16.2 g/dL (ref 13.0–17.0)
Hemoglobin: 16.7 g/dL (ref 13.0–17.0)
MCH: 28.4 pg (ref 26.0–34.0)
MCH: 28.8 pg (ref 26.0–34.0)
MCHC: 34.2 g/dL (ref 30.0–36.0)
MCHC: 34.4 g/dL (ref 30.0–36.0)
MCV: 83.1 fL (ref 80.0–100.0)
MCV: 83.8 fL (ref 80.0–100.0)
Platelets: 193 10*3/uL (ref 150–400)
Platelets: 202 10*3/uL (ref 150–400)
RBC: 5.62 MIL/uL (ref 4.22–5.81)
RBC: 5.87 MIL/uL — ABNORMAL HIGH (ref 4.22–5.81)
RDW: 12.4 % (ref 11.5–15.5)
RDW: 12.4 % (ref 11.5–15.5)
WBC: 7.1 10*3/uL (ref 4.0–10.5)
WBC: 8.2 10*3/uL (ref 4.0–10.5)
nRBC: 0 % (ref 0.0–0.2)
nRBC: 0 % (ref 0.0–0.2)

## 2024-01-16 LAB — RAPID URINE DRUG SCREEN, HOSP PERFORMED
Amphetamines: NOT DETECTED
Barbiturates: NOT DETECTED
Benzodiazepines: NOT DETECTED
Cocaine: NOT DETECTED
Opiates: NOT DETECTED
Tetrahydrocannabinol: NOT DETECTED

## 2024-01-16 LAB — DIFFERENTIAL
Abs Immature Granulocytes: 0.03 10*3/uL (ref 0.00–0.07)
Basophils Absolute: 0.1 10*3/uL (ref 0.0–0.1)
Basophils Relative: 1 %
Eosinophils Absolute: 0.2 10*3/uL (ref 0.0–0.5)
Eosinophils Relative: 3 %
Immature Granulocytes: 0 %
Lymphocytes Relative: 25 %
Lymphs Abs: 2.1 10*3/uL (ref 0.7–4.0)
Monocytes Absolute: 0.9 10*3/uL (ref 0.1–1.0)
Monocytes Relative: 11 %
Neutro Abs: 4.9 10*3/uL (ref 1.7–7.7)
Neutrophils Relative %: 60 %

## 2024-01-16 LAB — I-STAT CHEM 8, ED
BUN: 13 mg/dL (ref 6–20)
Calcium, Ion: 1.07 mmol/L — ABNORMAL LOW (ref 1.15–1.40)
Chloride: 104 mmol/L (ref 98–111)
Creatinine, Ser: 1.1 mg/dL (ref 0.61–1.24)
Glucose, Bld: 92 mg/dL (ref 70–99)
HCT: 47 % (ref 39.0–52.0)
Hemoglobin: 16 g/dL (ref 13.0–17.0)
Potassium: 3.5 mmol/L (ref 3.5–5.1)
Sodium: 141 mmol/L (ref 135–145)
TCO2: 25 mmol/L (ref 22–32)

## 2024-01-16 LAB — URINALYSIS, ROUTINE W REFLEX MICROSCOPIC
Bilirubin Urine: NEGATIVE
Glucose, UA: NEGATIVE mg/dL
Hgb urine dipstick: NEGATIVE
Ketones, ur: NEGATIVE mg/dL
Leukocytes,Ua: NEGATIVE
Nitrite: NEGATIVE
Protein, ur: NEGATIVE mg/dL
Specific Gravity, Urine: 1.028 (ref 1.005–1.030)
pH: 5 (ref 5.0–8.0)

## 2024-01-16 LAB — COMPREHENSIVE METABOLIC PANEL
ALT: 46 U/L — ABNORMAL HIGH (ref 0–44)
AST: 29 U/L (ref 15–41)
Albumin: 3.8 g/dL (ref 3.5–5.0)
Alkaline Phosphatase: 76 U/L (ref 38–126)
Anion gap: 9 (ref 5–15)
BUN: 13 mg/dL (ref 6–20)
CO2: 24 mmol/L (ref 22–32)
Calcium: 8.5 mg/dL — ABNORMAL LOW (ref 8.9–10.3)
Chloride: 106 mmol/L (ref 98–111)
Creatinine, Ser: 1.12 mg/dL (ref 0.61–1.24)
GFR, Estimated: >60 mL/min (ref >60)
Glucose, Bld: 98 mg/dL (ref 70–99)
Potassium: 3.5 mmol/L (ref 3.5–5.1)
Sodium: 139 mmol/L (ref 135–145)
Total Bilirubin: 0.7 mg/dL (ref 0.0–1.2)
Total Protein: 5.9 g/dL — ABNORMAL LOW (ref 6.5–8.1)

## 2024-01-16 LAB — ETHANOL: Alcohol, Ethyl (B): <10 mg/dL (ref <10)

## 2024-01-16 LAB — TSH: TSH: 1.33 u[IU]/mL (ref 0.450–4.500)

## 2024-01-16 LAB — LIPOPROTEIN A (LPA): Lipoprotein (a): 234.7 nmol/L — ABNORMAL HIGH (ref <75.0)

## 2024-01-16 LAB — CREATININE, SERUM
Creatinine, Ser: 1.07 mg/dL (ref 0.61–1.24)
GFR, Estimated: >60 mL/min (ref >60)

## 2024-01-16 LAB — CBG MONITORING, ED: Glucose-Capillary: 96 mg/dL (ref 70–99)

## 2024-01-16 LAB — GLUCOSE, CAPILLARY
Glucose-Capillary: 118 mg/dL — ABNORMAL HIGH (ref 70–99)
Glucose-Capillary: 114 mg/dL — ABNORMAL HIGH (ref 70–99)

## 2024-01-16 LAB — MRSA NEXT GEN BY PCR, NASAL: MRSA by PCR Next Gen: NOT DETECTED

## 2024-01-16 LAB — PROTIME-INR
INR: 1 (ref 0.8–1.2)
Prothrombin Time: 13.8 s (ref 11.4–15.2)

## 2024-01-16 LAB — APTT: aPTT: 37 s — ABNORMAL HIGH (ref 24–36)

## 2024-01-16 MED ORDER — CLOPIDOGREL BISULFATE 75 MG PO TABS
75.0000 mg | ORAL_TABLET | Freq: Every day | ORAL | Status: DC
  Administered 2024-01-16: 75 mg via ORAL
  Filled 2024-01-16: qty 1

## 2024-01-16 MED ORDER — IOHEXOL 350 MG/ML SOLN
75.0000 mL | Freq: Once | INTRAVENOUS | Status: AC | PRN
  Administered 2024-01-16: 75 mL via INTRAVENOUS

## 2024-01-16 MED ORDER — SODIUM CHLORIDE 0.9 % IV BOLUS
1000.0000 mL | Freq: Once | INTRAVENOUS | Status: AC
  Administered 2024-01-16: 1000 mL via INTRAVENOUS

## 2024-01-16 MED ORDER — STROKE: EARLY STAGES OF RECOVERY BOOK
Freq: Once | Status: AC
  Filled 2024-01-16: qty 1

## 2024-01-16 MED ORDER — ACETAMINOPHEN 650 MG RE SUPP: 650.0000 mg | RECTAL | Status: DC | PRN

## 2024-01-16 MED ORDER — ORAL CARE MOUTH RINSE: 15.0000 mL | OROMUCOSAL | Status: DC | PRN

## 2024-01-16 MED ORDER — TICAGRELOR 90 MG PO TABS
90.0000 mg | ORAL_TABLET | Freq: Two times a day (BID) | ORAL | Status: DC
  Administered 2024-01-17: 90 mg via ORAL
  Filled 2024-01-16: qty 1

## 2024-01-16 MED ORDER — IOHEXOL 350 MG/ML SOLN
60.0000 mL | Freq: Once | INTRAVENOUS | Status: AC | PRN
  Administered 2024-01-16: 60 mL via INTRAVENOUS

## 2024-01-16 MED ORDER — ACETAMINOPHEN 325 MG PO TABS
650.0000 mg | ORAL_TABLET | ORAL | Status: DC | PRN
  Administered 2024-01-17: 650 mg via ORAL
  Filled 2024-01-16: qty 2

## 2024-01-16 MED ORDER — ACETAMINOPHEN 160 MG/5ML PO SOLN: 650.0000 mg | ORAL | Status: DC | PRN

## 2024-01-16 MED ORDER — ATORVASTATIN CALCIUM 40 MG PO TABS
40.0000 mg | ORAL_TABLET | Freq: Every day | ORAL | Status: DC
  Administered 2024-01-16 – 2024-01-17 (×2): 40 mg via ORAL
  Filled 2024-01-16 (×2): qty 1

## 2024-01-16 MED ORDER — CHLORHEXIDINE GLUCONATE CLOTH 2 % EX PADS
6.0000 | MEDICATED_PAD | Freq: Every day | CUTANEOUS | Status: DC
  Administered 2024-01-16 (×2): 6 via TOPICAL

## 2024-01-16 MED ORDER — ENOXAPARIN SODIUM 40 MG/0.4ML IJ SOSY: 40.0000 mg | PREFILLED_SYRINGE | INTRAMUSCULAR | Status: DC

## 2024-01-16 MED ORDER — SENNOSIDES-DOCUSATE SODIUM 8.6-50 MG PO TABS: 1.0000 | ORAL_TABLET | Freq: Every evening | ORAL | Status: DC | PRN

## 2024-01-16 MED ORDER — HEPARIN SODIUM (PORCINE) 5000 UNIT/ML IJ SOLN
5000.0000 [IU] | Freq: Three times a day (TID) | INTRAMUSCULAR | Status: DC
  Administered 2024-01-16 – 2024-01-17 (×4): 5000 [IU] via SUBCUTANEOUS
  Filled 2024-01-16 (×4): qty 1

## 2024-01-16 MED ORDER — ASPIRIN 81 MG PO TBEC
81.0000 mg | DELAYED_RELEASE_TABLET | Freq: Every day | ORAL | Status: DC
  Administered 2024-01-16 – 2024-01-17 (×2): 81 mg via ORAL
  Filled 2024-01-16 (×2): qty 1

## 2024-01-16 MED ORDER — SODIUM CHLORIDE 0.9 % IV SOLN: INTRAVENOUS | Status: DC

## 2024-01-16 NOTE — Progress Notes (Signed)
 No change in Lpa levels. If patient willing we could switch from praluent to Repatha to see if this would make an impact. Will continue to look for clinical trials

## 2024-01-16 NOTE — Progress Notes (Signed)
 OT Cancellation Note  Patient Details Name: Devin Robert Burundi Jr. MRN: [id number] DOB: 14-Nov-1970   Cancelled Treatment:    Reason Eval/Treat Not Completed: Active bedrest order (strict bedrest. Will follow up for OT evaluation as approrpriate.)  Carver Fila, OTD, OTR/L [facility] [phone]   Dalphine Handing 01/16/2024, 7:34 AM

## 2024-01-16 NOTE — H&P (Signed)
 NEUROLOGY H&P NOTE   Date of service: January 16, 2024 Patient Name: Devin Slivinski Burundi Jr. MRN:  [id number] DOB:  Aug 23, 1971 Chief Complaint: "L sided weakness"  History of Present Illness  Devin Robert Burundi Jr. is a 53 y.o. male with hx of embolic strokes of unclear source, recent L MCA stroke, CAD, STEMI, HLD, HTN, who presents with a left facial droop and left sided weakness.  He was in the bathroom where he fell and his his head. Wife called EMS. He was noted to be weak on the left side and EMS called. He was brought in as a code stroke. His symptoms improved enroute with only a mild L facial droop on arrival to [facility].  CT Head negative for ICH but concerning for hyperdense R MCA in the sylvian fissure.  Of note, recently admitted to [facility] for cryptogenic but embolic appearing L MCA stroke and underwent extensive workup with TTE, TEE and recently had loop recorder placed.  Last known well: 1800 on 01/15/24 Modified rankin score: 0-Completely asymptomatic and back to baseline post- stroke IV Thrombolysis: not offered due to recent strokes. Thrombectomy: not offered, too mild to treat.  NIHSS components Score: Comment  1a Level of Conscious 0[x]  1[]  2[]  3[]      1b LOC Questions 0[x]  1[]  2[]       1c LOC Commands 0[x]  1[]  2[]       2 Best Gaze 0[x]  1[]  2[]       3 Visual 0[x]  1[]  2[]  3[]      4 Facial Palsy 0[]  1[x]  2[]  3[]      5a Motor Arm - left 0[x]  1[]  2[]  3[]  4[]  UN[]    5b Motor Arm - Right 0[x]  1[]  2[]  3[]  4[]  UN[]    6a Motor Leg - Left 0[x]  1[]  2[]  3[]  4[]  UN[]    6b Motor Leg - Right 0[x]  1[]  2[]  3[]  4[]  UN[]    7 Limb Ataxia 0[x]  1[]  2[]  3[]  UN[]     8 Sensory 0[x]  1[]  2[]  UN[]      9 Best Language 0[x]  1[]  2[]  3[]      10 Dysarthria 0[x]  1[]  2[]  UN[]      11 Extinct. and Inattention 0[x]  1[]  2[]       TOTAL: 1      ROS  Comprehensive ROS performed and pertinent positives documented in the HPI  Past History   Past Medical History:  Diagnosis Date   Coronary artery disease     Family history of heart disease    HTN (hypertension)    Hyperlipidemia    Palpitations    STEMI (ST elevation myocardial infarction) (HCC)    PCI/DES to p/mLAD, 60% lcx, 40% ostial LM, and 60% PDA, EF 45%   Past Surgical History:  Procedure Laterality Date   CORONARY/GRAFT ACUTE MI REVASCULARIZATION N/A 12/21/2018   Procedure: Coronary/Graft Acute MI Revascularization;  Surgeon: Lyn Records, MD;  Location: [facility] INVASIVE CV LAB;  Service: Cardiovascular;  Laterality: N/A;   CORONARY/GRAFT ACUTE MI REVASCULARIZATION N/A 09/14/2023   Procedure: Coronary/Graft Acute MI Revascularization;  Surgeon: Yates Decamp, MD;  Location: [facility] INVASIVE CV LAB;  Service: Cardiovascular;  Laterality: N/A;   LEFT HEART CATH AND CORONARY ANGIOGRAPHY N/A 12/21/2018   Procedure: LEFT HEART CATH AND CORONARY ANGIOGRAPHY;  Surgeon: Lyn Records, MD;  Location: [facility] INVASIVE CV LAB;  Service: Cardiovascular;  Laterality: N/A;   LOOP RECORDER INSERTION N/A 12/11/2023   Procedure: LOOP RECORDER INSERTION;  Surgeon: Maurice Small, MD;  Location: [facility] INVASIVE CV LAB;  Service: Cardiovascular;  Laterality:  N/A;   TRANSESOPHAGEAL ECHOCARDIOGRAM (CATH LAB) N/A 12/11/2023   Procedure: TRANSESOPHAGEAL ECHOCARDIOGRAM;  Surgeon: Jake Bathe, MD;  Location: [facility] INVASIVE CV LAB;  Service: Cardiovascular;  Laterality: N/A;   Family History  Problem Relation Age of Onset   Heart disease Mother    Heart disease Father 68       CABG   Hypertension Father    Aortic aneurysm Father 47   Skin cancer Maternal Grandmother    Heart failure Maternal Grandfather    COPD Maternal Grandfather    Diabetes Paternal Grandmother    Social History   Socioeconomic History   Marital status: Married    Spouse name: Not on file   Number of children: Not on file   Years of education: Not on file   Highest education level: Not on file  Occupational History   Not on file  Tobacco Use   Smoking status: Never   Smokeless tobacco:  Never  Substance and Sexual Activity   Alcohol use: Not on file   Drug use: Never   Sexual activity: Not on file  Other Topics Concern   Not on file  Social History Narrative   Not on file   Social Drivers of Health   Financial Resource Strain: Not on file  Food Insecurity: No Food Insecurity (12/13/2023)   Hunger Vital Sign    Worried About Running Out of Food in the Last Year: Never true    Ran Out of Food in the Last Year: Never true  Transportation Needs: No Transportation Needs (12/13/2023)   PRAPARE - Administrator, Civil Service (Medical): No    Lack of Transportation (Non-Medical): No  Physical Activity: Not on file  Stress: Not on file  Social Connections: Not on file   No Known Allergies  Medications   Current Facility-Administered Medications:    [START ON 01/17/2024]  stroke: early stages of recovery book, , Does not apply, Once, Erick Blinks, MD   0.9 %  sodium chloride infusion, , Intravenous, Continuous, Erick Blinks, MD, Last Rate: 150 mL/hr at 01/16/24 0136, New Bag at 01/16/24 0136   acetaminophen (TYLENOL) tablet 650 mg, 650 mg, Oral, Q4H PRN **OR** acetaminophen (TYLENOL) 160 MG/5ML solution 650 mg, 650 mg, Per Tube, Q4H PRN **OR** acetaminophen (TYLENOL) suppository 650 mg, 650 mg, Rectal, Q4H PRN, Erick Blinks, MD   aspirin EC tablet 81 mg, 81 mg, Oral, Daily, Erick Blinks, MD   Chlorhexidine Gluconate Cloth 2 % PADS 6 each, 6 each, Topical, Daily, Erick Blinks, MD   clopidogrel (PLAVIX) tablet 75 mg, 75 mg, Oral, Daily, Erick Blinks, MD   enoxaparin (LOVENOX) injection 40 mg, 40 mg, Subcutaneous, Q24H, Erick Blinks, MD   Oral care mouth rinse, 15 mL, Mouth Rinse, PRN, Erick Blinks, MD   senna-docusate (Senokot-S) tablet 1 tablet, 1 tablet, Oral, QHS PRN, Erick Blinks, MD   Vitals   Vitals:   01/16/24 0040 01/16/24 0231  BP: 134/76   Pulse: 77   Resp: 18   Temp: 97.7 F (36.5 C) 98.4  F (36.9 C)  TempSrc: Oral Oral  SpO2: 98%      There is no height or weight on file to calculate BMI.  Physical Exam   General: Laying comfortably in bed; in no acute distress.  HENT: Normal oropharynx and mucosa. Normal external appearance of ears and nose.  Neck: Supple, no pain or tenderness  CV: No JVD. No peripheral edema.  Pulmonary: Symmetric Chest rise. Normal respiratory effort.  Abdomen: Soft to touch, non-tender.  Ext: No cyanosis, edema, or deformity  Skin: No rash. Normal palpation of skin.   Musculoskeletal: Normal digits and nails by inspection. No clubbing.   Neurologic Examination  Mental status/Cognition: Alert, oriented to self, place, month and year, good attention.  Speech/language: Fluent, comprehension intact, object naming intact, repetition intact.  Cranial nerves:   CN II Pupils equal and reactive to light, no VF deficits    CN III,IV,VI EOM intact, no gaze preference or deviation, no nystagmus    CN V normal sensation in V1, V2, and V3 segments bilaterally    CN VII no asymmetry, no nasolabial fold flattening    CN VIII normal hearing to speech    CN IX & X normal palatal elevation, no uvular deviation    CN XI 5/5 head turn and 5/5 shoulder shrug bilaterally    CN XII midline tongue protrusion    Motor:  Muscle bulk: normal, tone normal, pronator drift none tremor none Mvmt Root Nerve  Muscle Right Left Comments  SA C5/6 Ax Deltoid 5 5   EF C5/6 Mc Biceps 5 5   EE C6/7/8 Rad Triceps 5 5   WF C6/7 Med FCR     WE C7/8 PIN ECU     F Ab C8/T1 U ADM/FDI 5 5   HF L1/2/3 Fem Illopsoas 5 5   KE L2/3/4 Fem Quad 5 5   DF L4/5 D Peron Tib Ant 5 5   PF S1/2 Tibial Grc/Sol 5 5    Sensation:  Light touch Intact throughout   Pin prick    Temperature    Vibration   Proprioception    Coordination/Complex Motor:  - Finger to Nose intact BL - Heel to shin intact BL - Rapid alternating movement are normal - Gait: deferred.   Labs   CBC:  Recent  Labs  Lab 01/16/24 0010 01/16/24 0013  WBC 8.2  --   NEUTROABS 4.9  --   HGB 16.2 16.0  HCT 47.1 47.0  MCV 83.8  --   PLT 193  --    Basic Metabolic Panel:  Lab Results  Component Value Date   NA 141 01/16/2024   K 3.5 01/16/2024   CO2 24 01/16/2024   GLUCOSE 92 01/16/2024   BUN 13 01/16/2024   CREATININE 1.10 01/16/2024   CALCIUM 8.5 (L) 01/16/2024   GFRNONAA >60 01/16/2024   GFRAA 98 06/24/2019   Lipid Panel:  Lab Results  Component Value Date   LDLCALC 64 12/09/2023   HgbA1c:  Lab Results  Component Value Date   HGBA1C 4.9 12/11/2023   Urine Drug Screen:     Component Value Date/Time   LABOPIA NONE DETECTED 12/10/2023 0829   COCAINSCRNUR NONE DETECTED 12/10/2023 0829   LABBENZ NONE DETECTED 12/10/2023 0829   AMPHETMU NONE DETECTED 12/10/2023 0829   THCU POSITIVE (A) 12/10/2023 0829   LABBARB NONE DETECTED 12/10/2023 0829    Alcohol Level     Component Value Date/Time   ETH <10 01/16/2024 0010   INR  Lab Results  Component Value Date   INR 1.0 01/16/2024   APTT  Lab Results  Component Value Date   APTT 37 (H) 01/16/2024     CT Head without contrast(Personally reviewed): CTH was negative for a large hypodensity concerning for a large territory infarct or hyperdensity concerning for an ICH. R MCA ?hyperdense  CT angio Head and Neck with contrast(Personally reviewed): R MCA M2 short segment occlusion with distal  reconstitution. This is concerning for a thrombus.  MRI Brain(Personally reviewed): Pending  Assessment   Devin Robert Burundi Jr. is a 53 y.o. male with hx of embolic strokes of unclear source, recent L MCA stroke, CAD, STEMI, HLD, HTN, who presents with a left facial droop and left sided weakness. He was found to have a R MCA M2 thrombus. However, his symptoms had resolved in the ED.  Plan is to monitor closely in the ICU. If he has fluctuating or develops significantly disabling deficits, close monitoring will allow [place] to identify  symptoms early and consider thrombectomy.  Primary Diagnosis:  Cerebral infarction due to embolism of  right middle cerebral artery.   Secondary Diagnosis: Essential (primary) hypertension  Recommendations   Acute R MCA stroke with R MCA M2 thrombus: - Frequent Neuro checks per stroke unit protocol - Recommend brain imaging with MRI Brain without contrast - no need for TTE as he recently had one. - Recommend obtaining Lipid panel with LDL - Please start statin if LDL > 70 - Recommend HbA1c to evaluate for diabetes and how well it is controlled. - Antithrombotic - Aspirin 81mg  daily along with plavix 75mg  daily. - Recommend DVT ppx - SBP goal - permissive hypertension first 24 h < 220/110. Held home meds.  - Recommend Telemetry monitoring for arrythmia - Recommend bedside swallow screen prior to PO intake. - Stroke education booklet - Recommend PT/OT/SLP consult - loop recorder interrogation in AM. - fluids at 150cc/hr, head of bed flat and strict bedrest today. - he and his wife are agreeable to thrombectomy if he develops disabling deficits. they understands the risks of ICH associated with thrombectomy.  Hypertension Home meds: hold home medications tonight. Stable Long-term BP goal normotensive   Hyperlipidemia Home meds: Atorvastatin 40 mg, resumed in hospital LDL 64, goal < 70 Continue statin at discharge ______________________________________________________________________  Code status discussed with patient and he is full code. Discussed allergies and patient has no known allergies.  This patient is critically ill and at significant risk of neurological worsening, death and care requires constant monitoring of vital signs, hemodynamics,respiratory and cardiac monitoring, neurological assessment, discussion with family, other specialists and medical decision making of high complexity. I spent 45 minutes of neurocritical care time  in the care of  this patient. This was  time spent independent of any time provided by nurse practitioner or PA.  Erick Blinks [facility] 01/16/2024  3:26 AM    Signed, Erick Blinks, MD [facility]  Risks, benefits and alternatives of IVT discussed with patient and/or family and they agreed.

## 2024-01-16 NOTE — Code Documentation (Signed)
 Stroke Response Nurse Documentation Code Documentation  Devin Robert Burundi Jr. is a 53 y.o. male arriving to [facility]  via [facility] on 3/26 with past medical hx of CVA, . On  [place] trial drug . Code stroke was activated by EMS.   Patient from home where he was LKW at 2300 and now complaining of left sided facial droop and left sided weakness.   Stroke team at the bedside on patient arrival. Labs drawn and patient cleared for CT by Dr. Preston Fleeting. Patient to CT with team. NIHSS 0, see documentation for details and code stroke times. Patient with resolved deficits on exam. The following imaging was completed:  CT Head and CTA. Patient is not a candidate for IV Thrombolytic due to resolved symptoms. Patient is not a candidate for IR due to resolved symptoms.   Care Plan: TIA alert.     Bedside handoff with ED RN.    Rose Fillers  Rapid Response RN

## 2024-01-16 NOTE — Progress Notes (Signed)
 eLink Physician-Brief Progress Note Patient Name: Devin Robert Burundi Jr. DOB: 1971/04/03 MRN: [id number]   Date of Service  01/16/2024  HPI/Events of Note  Patient admitted as a Code Stroke, work up in progress.  eICU Interventions  New Patient Evaluation.        Migdalia Dk 01/16/2024, 4:49 AM

## 2024-01-16 NOTE — Evaluation (Signed)
 Physical Therapy Brief Evaluation and Discharge Note Patient Details Name: Devin Robert Burundi Jr. MRN: [id number] DOB: 01/27/1971 Today's Date: 01/16/2024   History of Present Illness  Pt is 53 year old presented to [facility] on  01/16/24 for lt sided weakness and dysarthria. Symptoms resolved on arrival. Pt with acute rt MCA stroke. PMH - recent lt MCA CVA, CAD, STEMI 2020 s/p PCI, STEMI 2024 s/p PCI, HTN, HLD  Clinical Impression  Pt back to baseline with all mobility and activities. Cognition intact. No further PT needed.        PT Assessment Patient does not need any further PT services  Assistance Needed at Discharge  None    Equipment Recommendations None recommended by PT  Recommendations for Other Services       Precautions/Restrictions Precautions Precautions: None        Mobility  Bed Mobility Rolling: Independent Supine/Sidelying to sit: Independent      Transfers Overall transfer level: Independent                      Ambulation/Gait Ambulation/Gait assistance: Independent Gait Distance (Feet): 300 Feet Assistive device: None Gait Pattern/deviations: WFL(Within Functional Limits) Gait Speed: Pace WFL    Home Activity Instructions    Stairs            Modified Rankin (Stroke Patients Only)        Balance Overall balance assessment: Independent                        Pertinent Vitals/Pain   Pain Assessment Pain Assessment: No/denies pain     Home Living   Living Arrangements: Spouse/significant other           Additional Comments: pt works full time in [employer]. Wife is an eye doctor. One son in college, 57 yo still at home.    Prior Function Level of Independence: Independent      UE/LE Assessment   UE ROM/Strength/Tone/Coordination: WFL    LE ROM/Strength/Tone/Coordination: [facility]      Communication   Communication Communication: No apparent difficulties     Cognition Overall Cognitive Status:  Appears within functional limits for tasks assessed/performed       General Comments General comments (skin integrity, edema, etc.): Performed side step, 360 degree turn, backwards stepping, picking up object from floor    Exercises     Assessment/Plan    PT Problem List         PT Visit Diagnosis      No Skilled PT Patient is independent with all acitivity/mobility;Patient at baseline level of functioning   Co-evaluation                AMPAC 6 Clicks Help needed turning from your back to your side while in a flat bed without using bedrails?: None Help needed moving from lying on your back to sitting on the side of a flat bed without using bedrails?: None Help needed moving to and from a bed to a chair (including a wheelchair)?: None Help needed standing up from a chair using your arms (e.g., wheelchair or bedside chair)?: None Help needed to walk in hospital room?: None Help needed climbing 3-5 steps with a railing? : None 6 Click Score: 24      End of Session   Activity Tolerance: Patient tolerated treatment well Patient left: in chair;with call bell/phone within reach         Time: 1112-1129 PT Time  Calculation (min) (ACUTE ONLY): 17 min  Charges:   PT Evaluation $PT Eval Low Complexity: 1 Low      [facility] PT Acute Rehabilitation Services Office [phone]   Angelina Ok [facility]  01/16/2024, 11:51 AM

## 2024-01-16 NOTE — ED Notes (Signed)
 Neuro MD at bedside

## 2024-01-16 NOTE — TOC CM/SW Note (Signed)
 Transition of Care [facility]) - Inpatient Brief Assessment   Patient Details  Name: Devin Robert Burundi Jr. MRN: [id number] Date of Birth: [birth year]  Transition of [facility]) CM/SW Contact:    Tom-Johnson, Hershal Coria, RN Phone Number: 01/16/2024, 1:46 PM   Clinical Narrative:  Patient presented to the ED after a Fall at home with Lt Facial Droop, Lt Arm Weakness and Slurred Speech. Symptoms resolved upon arrival with no deficit. Neurology following.  Patient recently admitted with Stroke 12/06/23.   From home with wife, has two children. Mother and sister supportive. Independent with care and able to drive self. Does not have DME's at home.  PCP is Merri Brunette, MD and uses [facility] [employer] and also [employer].   No TOC needs or recommendations noted at this time.  Patient not Medically ready for discharge.  CM will continue to follow as patient progresses with care towards discharge.        Transition of Care Asessment: Insurance and Status: Insurance coverage has been reviewed Patient has primary care physician: Yes Home environment has been reviewed: Yes Prior level of function:: Independent Prior/Current Home Services: No current home services Social Drivers of Health Review: SDOH reviewed no interventions necessary Readmission risk has been reviewed: Yes Transition of care needs: no transition of care needs at this time

## 2024-01-16 NOTE — Plan of Care (Signed)

## 2024-01-16 NOTE — Progress Notes (Addendum)
 STROKE TEAM PROGRESS NOTE   INTERIM HISTORY/SUBJECTIVE  Patient presents to the hospital for evaluation of the left facial droop and left-sided weakness after experiencing a fall and hit his head CT head with questionable hyperdense right M1 M2 CTA occluded versus severely stenotic right M2 MRI brain moderate area of restricted diffusion in right frontal lobe  Devin Wright is at the bedside.  Patient is awake alert and oriented.  Devin Wright does endorse that patient had an ocular migraine on Monday  Loop recorder interrogated with no A-fib seen. Will check CT abdomen chest and pelvis to rule out malignancy.  Patient is participating in the [employer]  stroke prevention study and has made steady endpoint by having a stroke. Dr.Dia Donate spoke to him and his Devin Wright about the Sleep Smart Study Trial.  He and his Devin Wright would like more information to read up on it and will let us know their decision whether or not they would like to enroll in the study  His neurological exam is stable as well as labs and vital signs, will transfer patient out of the ICU  OBJECTIVE  CBC    Component Value Date/Time   WBC 8.2 01/16/2024 0010   RBC 5.62 01/16/2024 0010   HGB 16.0 01/16/2024 0013   HGB 17.7 01/06/2019 1313   HCT 47.0 01/16/2024 0013   HCT 51.7 (H) 01/06/2019 1313   PLT 193 01/16/2024 0010   PLT 317 01/06/2019 1313   MCV 83.8 01/16/2024 0010   MCV 86 01/06/2019 1313   MCH 28.8 01/16/2024 0010   MCHC 34.4 01/16/2024 0010   RDW 12.4 01/16/2024 0010   RDW 12.7 01/06/2019 1313   LYMPHSABS 2.1 01/16/2024 0010   MONOABS 0.9 01/16/2024 0010   EOSABS 0.2 01/16/2024 0010   BASOSABS 0.1 01/16/2024 0010    BMET    Component Value Date/Time   NA 141 01/16/2024 0013   NA 141 10/19/2022 0929   K 3.5 01/16/2024 0013   CL 104 01/16/2024 0013   CO2 24 01/16/2024 0010   GLUCOSE 92 01/16/2024 0013   BUN 13 01/16/2024 0013   BUN 16 10/19/2022 0929   CREATININE 1.10 01/16/2024 0013   CALCIUM 8.5 (L) 01/16/2024  0010   EGFR 81 10/19/2022 0929   GFRNONAA >60 01/16/2024 0010   GFRNONAA 75 12/21/2023 1116    IMAGING past 24 hours MR BRAIN WO CONTRAST Result Date: 01/16/2024 CLINICAL DATA:  Neuro deficit with stroke suspected EXAM: MRI HEAD WITHOUT CONTRAST TECHNIQUE: Multiplanar, multiecho pulse sequences of the brain and surrounding structures were obtained without intravenous contrast. COMPARISON:  CTA from earlier today and brain MRI 12/09/2023 FINDINGS: Brain: Moderate area of restricted diffusion in the anterior right frontal lobe without FLAIR signal abnormality. Expected evolution of recently seen contralateral left frontal infarct with T1 hyperintensity from cortical laminar necrosis. There is a small area of left frontal parietal cortical restricted diffusion. No acute hemorrhage, hydrocephalus, mass, or collection. Vascular: Major flow voids are preserved. Skull and upper cervical spine: Normal marrow signal Sinuses/Orbits: Negative These results were called by telephone at the time of interpretation on 01/16/2024 at 4:17 am to provider [facility] , who verbally acknowledged these results. IMPRESSION: Moderate area of restricted diffusion in the right frontal lobe matching the diseased right MCA branch vessel by CTA. Hyperintensity is not yet seen on FLAIR. Small acute infarct in the left frontal parietal cortex. Expected evolution of left frontal infarct seen last month. Electronically Signed   By: Audry Riles.D.  On: 01/16/2024 04:18   CT ANGIO HEAD NECK W WO CM (CODE STROKE) Result Date: 01/16/2024 CLINICAL DATA:  Neuro deficit, acute, stroke suspected EXAM: CT ANGIOGRAPHY HEAD AND NECK WITH AND WITHOUT CONTRAST TECHNIQUE: Multidetector CT imaging of the head and neck was performed using the standard protocol during bolus administration of intravenous contrast. Multiplanar CT image reconstructions and MIPs were obtained to evaluate the vascular anatomy. Carotid stenosis measurements (when  applicable) are obtained utilizing NASCET criteria, using the distal internal carotid diameter as the denominator. RADIATION DOSE REDUCTION: This exam was performed according to the departmental dose-optimization program which includes automated exposure control, adjustment of the mA and/or kV according to patient size and/or use of iterative reconstruction technique. CONTRAST:  60mL OMNIPAQUE IOHEXOL 350 MG/ML SOLN COMPARISON:  CTA head/neck February 16, 25. FINDINGS: CTA NECK FINDINGS Aortic arch: Great vessel origins are patent without significant stenosis. Right carotid system: No evidence of dissection, stenosis (50% or greater), or occlusion. Left carotid system: No evidence of dissection, stenosis (50% or greater), or occlusion. Vertebral arteries: Left dominant. No evidence of dissection, stenosis (50% or greater), or occlusion. Skeleton: No acute fracture on limited assessment. Other neck: No acute abnormality limited assessment. Upper chest: Visualized lung apices are clear. Review of the MIP images confirms the above findings CTA HEAD FINDINGS Anterior circulation: Bilateral intracranial ICAs are patent without significant stenosis. Right M1 MCA is patent. Occluded versus severely stenotic proximal right M2 MCA with distal reconstitution. Left MCA and bilateral ACAs are patent. Posterior circulation: Bilateral intradural vertebral arteries, basilar artery and bilateral posterior cerebral arteries are patent without proximal hemodynamically significant stenosis. Venous sinuses: As permitted by contrast timing, patent. Review of the MIP images confirms the above findings IMPRESSION: Occluded versus severely stenotic proximal right M2 MCA with distal reconstitution. Code stroke imaging results were communicated on 01/16/2024 at 12:20 am to provider Dr. Derry Lory via telephone, who verbally acknowledged these results. Electronically Signed   By: Feliberto Harts M.D.   On: 01/16/2024 00:33   CT HEAD CODE  STROKE WO CONTRAST Result Date: 01/16/2024 CLINICAL DATA:  Code stroke.  Neuro deficit, acute, stroke suspected EXAM: CT HEAD WITHOUT CONTRAST TECHNIQUE: Contiguous axial images were obtained from the base of the skull through the vertex without intravenous contrast. RADIATION DOSE REDUCTION: This exam was performed according to the departmental dose-optimization program which includes automated exposure control, adjustment of the mA and/or kV according to patient size and/or use of iterative reconstruction technique. COMPARISON:  None Available. FINDINGS: Brain: No evidence of acute large vascular territory infarction, hemorrhage, hydrocephalus, extra-axial collection or mass lesion/mass effect. Prior left anterior MCA territory infarcts. Vascular: Question hyperdense right M1/M2 MCA. Skull: No acute fracture. Sinuses/Orbits: Clear sinuses.  No acute orbital findings. ASPECTS [facility] Stroke Program Early CT Score) Total score (0-10 with 10 being normal): 10. IMPRESSION: 1. Question hyperdense right M1/M2 MCA.  Please see forthcoming CTA. 2. No evidence of acute large vascular territory infarct. ASPECTS is 10. 3. Prior left anterior MCA territory infarct. Code stroke imaging results were communicated on 01/16/2024 at 12:20 am to provider Dr. Derry Lory via telephone, who verbally acknowledged these results. Electronically Signed   By: Feliberto Harts M.D.   On: 01/16/2024 00:24    Vitals:   01/16/24 0400 01/16/24 0500 01/16/24 0600 01/16/24 0700  BP: 126/70 122/70 121/76 117/70  Pulse: 66 66 65 65  Resp: (!) 22 (!) 22 (!) 21 (!) 22  Temp:      TempSrc:  SpO2: 91% 92% 95% 95%     PHYSICAL EXAM General:  Alert, well-nourished, well-developed patient in no acute distress Psych:  Mood and affect appropriate for situation CV: Regular rate and rhythm on monitor Respiratory:  Regular, unlabored respirations on room air GI: Abdomen soft and nontender   NEURO:  Mental Status: AA&Ox3, patient is  able to give clear and coherent history Speech/Language: speech is without dysarthria or aphasia.  Naming, repetition, fluency, and comprehension intact.  Cranial Nerves:  II: PERRL. Visual fields full.  III, IV, VI: EOMI. Eyelids elevate symmetrically.  V: Sensation is intact to light touch and symmetrical to face.  VII: Slight left facial droop VIII: hearing intact to voice. IX, X: Palate elevates symmetrically. Phonation is normal.  BJ:YNWGNFAO shrug 5/5. XII: tongue is midline without fasciculations. Motor: 5/5 strength to all muscle groups tested.  Tone: is normal and bulk is normal Sensation- Intact to light touch bilaterally. Extinction absent to light touch to DSS.   Coordination: FTN intact bilaterally, HKS: no ataxia in BLE.No drift.  Gait- deferred  Most Recent NIH 1   ASSESSMENT/PLAN  Mr. Devin Robert Burundi Jr. is a 53 y.o. male with history of CAD s/p STEMI with stent placed, hypertension, hyperlipidemia, recent embolic cryptogenic stroke presented after a fall with left-sided weakness and left facial droop.  NIH on Admission 1  Acute Ischemic Infarct:  right MCA Etiology: Cryptogenic Code Stroke CT head questionable hyperdense right M1 M2   CTA head & neck occluded versus severely stenotic right M2 MRI  moderate area of restricted diffusion in right frontal lobe 2D Echo 2/25  EF 45 to 50%.  LVH.  Right ventricle mildly enlarged 11/2023 TEE with EF 55%, Mild mitral valve regurgitation, Left Atrium: Normal, no left atrial appendage thrombus with no shunt  Will check CT chest /abdomen/ pelvis to rule out malignancy Will check ANA ordered LDL 64 HgbA1c 4.9 VTE prophylaxis -heparin subcu aspirin 81 mg daily and Brilinta (ticagrelor) 90 mg bid prior to admission, now on aspirin 81 mg daily and Brilinta (ticagrelor) 90 mg bid   Therapy recommendations:  Pending Disposition: Pending  Hx of Stroke/TIA February 2025 left embolic cryptogenic stroke  Full workup at that  time including TCD with bubble negative, hypercoagulable panel negative Loop recorder placed and interrogated on this admission no A-fib  Hypertension CAD STEMI s/p stent Ischemic cardiomyopathy Home meds: Carvedilol 6.25 mg, losartan 50 mg Stable Resume aspirin and Brilinta Blood Pressure Goal: BP less than 180/105 or SBP 120-160 for first 24 hours then less than 180   Hyperlipidemia Home meds: Praluent, atorvastatin 40 mg,  resumed in hospital LDL 64, goal < 70 Continue statin at discharge  Substance Abuse February admission positive for THC UDS negative  Dysphagia Patient has post-stroke dysphagia, SLP consulted    Diet   Diet NPO time specified   Advance diet as tolerated  Other Stroke Risk Factors ETOH use, alcohol level <10, advised to drink no more than 2 drink(s) a day Obesity, There is no height or weight on file to calculate BMI., BMI >/= 30 associated with increased stroke risk, recommend weight loss, diet and exercise as appropriate  Coronary artery disease Congestive heart failure Likely obstructive sleep apnea, will need outpatient follow-up  Hospital day # 0  Gevena Mart DNP, ACNPC-AG  [facility]  I have personally obtained history,examined this patient, reviewed notes, independently viewed imaging studies, participated in medical decision making and plan of care.ROS completed by me personally and pertinent positives  fully documented  I have made any additions or clarifications directly to the above note. Agree with note above.  53 year old Caucasian male presents with recurrent cryptogenic stroke after recent stroke in the left hemisphere the current stroke is in the right hemisphere due to high-grade right MCA stenosis/occlusion.  Neurological exam is quite good except for mild left facial droop.  Patient is participating in [place] stroke study and hence has met the study) due to recurrent stroke and we will discontinue study medication.   Continue aspirin and Brilinta given his significant concurrent cardiac disease.  Check CT scan chest abdomen pelvis for malignancy.  Loop recorder interrogation has not yet shown paroxysmal A-fib.  Patient appears to be at risk for sleep apnea but will not qualify for the sleep smart study since he is presently enrolled in another stroke study.  We will arrange for outpatient sleep apnea evaluation Long discussion with patient and Devin Wright and answered questions.  Greater than 50% time during this 50-minute visit was spent on counseling and coordination of care about his recurrent cryptogenic stroke and discussion patient and family and answering questions.  Delia Heady, MD Medical Director [facility] St[facility] Pager: [phone] 01/16/2024 4:25 PM   To contact Stroke Continuity provider, please refer to [email or web address]. After hours, contact General Neurology

## 2024-01-16 NOTE — ED Provider Notes (Signed)
 [place] EMERGENCY DEPARTMENT AT [facility] Provider Note   CSN: [id number] Arrival date & time: 01/16/24  0005  An emergency department physician performed an initial assessment on this suspected stroke patient at 0006.  History  Chief Complaint  Patient presents with   Cerebrovascular Accident    Per EMS, pt was in the bathroom where pt fell trying to pick something up off the floor. Pt experienced left facial droop and arm weakness. Pt also experienced slurred speech. Upon arrival pt symptoms have resolved and pt is a/o x4. No deficits. CBG upon arrival 98  Pt has hx of TIA on February 13th.     Devin Stickels Burund[place] Jr. is a 53 y.o. male.  The history is provided by the patient and the EMS personnel.  He has history of hypertension, hyperlipidemia, coronary artery disease, stroke and was brought in by EMS as a code stroke.  He was reported to have had left-sided weakness at home and fell and did hit his head.  EMS reported initial left facial droop and dysarthric speech which have been improving.  Patient is denying headache.   Home Medications Prior to Admission medications   Medication Sig Start Date End Date Taking? Authorizing Provider  aspirin 81 MG chewable tablet Chew 1 tablet (81 mg total) by mouth daily. 09/15/23   Arty Baumgartner, NP  atorvastatin (LIPITOR) 40 MG tablet Take 1 tablet (40 mg total) by mouth daily. 09/27/23   Sharlene Dory, PA-C  carvedilol (COREG) 6.25 MG tablet Take 1 tablet (6.25 mg total) by mouth 2 (two) times daily with a meal. 09/27/23   Sharlene Dory, PA-C  losartan (COZAAR) 50 MG tablet Take 1 tablet (50 mg total) by mouth daily. 09/27/23   Sharlene Dory, PA-C  nitroGLYCERIN (NITROSTAT) 0.4 MG SL tablet Place 1 tablet (0.4 mg total) under the tongue every 5 (five) minutes as needed for chest pain. 12/20/22   Gaston Islam., NP  PRALUENT 150 MG/ML SOAJ Inject 1 pen under the skin every 14 days. 06/19/22   Lyn Records, MD   spironolactone (ALDACTONE) 25 MG tablet Take 0.5 tablets (12.5 mg total) by mouth daily. 09/27/23   Sharlene Dory, PA-C  Study - OCEANIC-STROKE - asundexian 50 mg or placebo tablet (PI-Sethi) Take 1 tablet (50 mg total) by mouth daily. For Investigational use only 12/11/23   Mathews Argyle, NP  ticagrelor (BRILINTA) 90 MG TABS tablet Take 1 tablet (90 mg total) by mouth 2 (two) times daily. 11/23/23   Sharlene Dory, PA-C      Allergies    Patient has no known allergies.    Review of Systems   Review of Systems  All other systems reviewed and are negative.   Physical Exam Updated Vital Signs BP 134/76 (BP Location: Left Arm)   Pulse 77   Temp 97.7 F (36.5 C) (Oral)   Resp 18   SpO2 98%  Physical Exam Vitals and nursing note reviewed.   53 year old male, resting comfortably and in no acute distress. Vital signs are normal. Oxygen saturation is 98%, which is normal. Head is normocephalic and atraumatic. PERRLA, EOMI. Oropharynx is clear. Neck is nontender and supple without adenopathy. Lungs are clear without rales, wheezes, or rhonchi. Chest is nontender. Heart has regular rate and rhythm without murmur. Abdomen is soft, flat, nontender. Extremities have no cyanosis or edema, full range of motion is present. Skin is warm and dry without rash. Neurologic:  Awake and alert and oriented x 3, speech is normal with normal fluency and no dysarthria, cranial nerves are intact except tongue protrudes slightly to the left suggesting left 12th cranial nerve deficit.  Strength is 5/5 in all 4 extremities, no pronator drift.  Sensation is intact throughout.  Finger-to-nose testing is normal.  ED Results / Procedures / Treatments   Labs (all labs ordered are listed, but only abnormal results are displayed) Labs Reviewed  APTT - Abnormal; Notable for the following components:      Result Value   aPTT 37 (*)    All other components within normal limits  COMPREHENSIVE METABOLIC PANEL -  Abnormal; Notable for the following components:   Calcium 8.5 (*)    Total Protein 5.9 (*)    ALT 46 (*)    All other components within normal limits  I-STAT CHEM 8, ED - Abnormal; Notable for the following components:   Calcium, Ion 1.07 (*)    All other components within normal limits  ETHANOL  PROTIME-INR  CBC  DIFFERENTIAL  RAPID URINE DRUG SCREEN, HOSP PERFORMED  URINALYSIS, ROUTINE W REFLEX MICROSCOPIC  CBG MONITORING, ED    EKG None  Radiology CT ANGIO HEAD NECK W WO CM (CODE STROKE) Result Date: 01/16/2024 CLINICAL DATA:  Neuro deficit, acute, stroke suspected EXAM: CT ANGIOGRAPHY HEAD AND NECK WITH AND WITHOUT CONTRAST TECHNIQUE: Multidetector CT imaging of the head and neck was performed using the standard protocol during bolus administration of intravenous contrast. Multiplanar CT image reconstructions and MIPs were obtained to evaluate the vascular anatomy. Carotid stenosis measurements (when applicable) are obtained utilizing NASCET criteria, using the distal internal carotid diameter as the denominator. RADIATION DOSE REDUCTION: This exam was performed according to the departmental dose-optimization program which includes automated exposure control, adjustment of the mA and/or kV according to patient size and/or use of iterative reconstruction technique. CONTRAST:  60mL OMNIPAQUE IOHEXOL 350 MG/ML SOLN COMPARISON:  CTA head/neck February 16, 25. FINDINGS: CTA NECK FINDINGS Aortic arch: Great vessel origins are patent without significant stenosis. Right carotid system: No evidence of dissection, stenosis (50% or greater), or occlusion. Left carotid system: No evidence of dissection, stenosis (50% or greater), or occlusion. Vertebral arteries: Left dominant. No evidence of dissection, stenosis (50% or greater), or occlusion. Skeleton: No acute fracture on limited assessment. Other neck: No acute abnormality limited assessment. Upper chest: Visualized lung apices are clear. Review of  the MIP images confirms the above findings CTA HEAD FINDINGS Anterior circulation: Bilateral intracranial ICAs are patent without significant stenosis. Right M1 MCA is patent. Occluded versus severely stenotic proximal right M2 MCA with distal reconstitution. Left MCA and bilateral ACAs are patent. Posterior circulation: Bilateral intradural vertebral arteries, basilar artery and bilateral posterior cerebral arteries are patent without proximal hemodynamically significant stenosis. Venous sinuses: As permitted by contrast timing, patent. Review of the MIP images confirms the above findings IMPRESSION: Occluded versus severely stenotic proximal right M2 MCA with distal reconstitution. Code stroke imaging results were communicated on 01/16/2024 at 12:20 am to provider Dr. Derry Lory via telephone, who verbally acknowledged these results. Electronically Signed   By: Feliberto Harts M.D.   On: 01/16/2024 00:33   CT HEAD CODE STROKE WO CONTRAST Result Date: 01/16/2024 CLINICAL DATA:  Code stroke.  Neuro deficit, acute, stroke suspected EXAM: CT HEAD WITHOUT CONTRAST TECHNIQUE: Contiguous axial images were obtained from the base of the skull through the vertex without intravenous contrast. RADIATION DOSE REDUCTION: This exam was performed according to the departmental dose-optimization  program which includes automated exposure control, adjustment of the mA and/or kV according to patient size and/or use of iterative reconstruction technique. COMPARISON:  None Available. FINDINGS: Brain: No evidence of acute large vascular territory infarction, hemorrhage, hydrocephalus, extra-axial collection or mass lesion/mass effect. Prior left anterior MCA territory infarcts. Vascular: Question hyperdense right M1/M2 MCA. Skull: No acute fracture. Sinuses/Orbits: Clear sinuses.  No acute orbital findings. ASPECTS [facility] Stroke Program Early CT Score) Total score (0-10 with 10 being normal): 10. IMPRESSION: 1. Question hyperdense  right M1/M2 MCA.  Please see forthcoming CTA. 2. No evidence of acute large vascular territory infarct. ASPECTS is 10. 3. Prior left anterior MCA territory infarct. Code stroke imaging results were communicated on 01/16/2024 at 12:20 am to provider Dr. Derry Lory via telephone, who verbally acknowledged these results. Electronically Signed   By: Feliberto Harts M.D.   On: 01/16/2024 00:24    Procedures Procedures  Cardiac monitor shows normal sinus rhythm, per my interpretation.  Medications Ordered in ED Medications  iohexol (OMNIPAQUE) 350 MG/ML injection 60 mL (60 mLs Intravenous Contrast Given 01/16/24 0022)    ED Course/ Medical Decision Making/ A&P                                 Medical Decision Making Amount and/or Complexity of Data Reviewed Labs: ordered. Radiology: ordered.   Stroke versus TIA in patient with recent stroke.  I have reviewed his past records, and he was admitted on 12/09/2023 with a stroke involving the left insular cortex and left frontal operculum with with concern for cardioembolic source.  He currently has minimal objective deficit.  I have ordered emergent CT of head.  CT scan shows prior left anterior MCA territory infarct, question hyperdense right M1/M2 MCA.  CT angiogram was obtained emergently and showed occluded versus severely stenotic proximal right M2 MCA with distal reconstitution.  I have independently viewed all of the images, and agree with the radiologist's interpretation.  I have reviewed his laboratory tests, and my interpretation is normal CBC, normal basic metabolic panel, undetectable ethanol level.  Patient was seen in conjunction with Dr. Derry Lory of neurology service who is admitting the patient to his service.  CRITICAL CARE Performed by: Dione Booze Total critical care time: 35 minutes Critical care time was exclusive of separately billable procedures and treating other patients. Critical care was necessary to treat or prevent  imminent or life-threatening deterioration. Critical care was time spent personally by me on the following activities: development of treatment plan with patient and/or surrogate as well as nursing, discussions with consultants, evaluation of patient's response to treatment, examination of patient, obtaining history from patient or surrogate, ordering and performing treatments and interventions, ordering and review of laboratory studies, ordering and review of radiographic studies, pulse oximetry and re-evaluation of patient's condition.  Final Clinical Impression(s) / ED Diagnoses Final diagnoses:  Acute CVA (cerebrovascular accident) [facility])    Rx / DC Orders ED Discharge Orders     None         Dione Booze, MD 01/16/24 [phone]

## 2024-01-16 NOTE — Progress Notes (Signed)
 No change in Lpa levels. If patient willing we could switch from praluent to Repatha to see if this would make an impact. Or Leqvio instead of Praluent and I would lean towards that.  I was thinking of placing him in Lp(a) trials if there was any open trials however he is already enrolled in [place] anticoagulation trial hence will not be a candidate for other trials.

## 2024-01-16 NOTE — Progress Notes (Signed)
 OT Cancellation Note  Patient Details Name: Devin Wright. MRN: [id number] DOB: 1971-08-14   Cancelled Treatment:    Reason Eval/Treat Not Completed: OT screened, no needs identified, will sign off (Per PT pt back to baseline with no acute OT needs. Will screen, please reconsult if there is change in pt status.)  Carver Fila, OTD, OTR/L [facility] Re[facility] [phone]   Dalphine Handing 01/16/2024, 11:31 AM

## 2024-01-16 NOTE — Progress Notes (Signed)
 SLP Cancellation Note  Patient Details Name: Devin Robert Burundi Jr. MRN: [id number] DOB: 04-05-1971   Cancelled treatment:       Reason Eval/Treat Not Completed: SLP screened, no needs identified, will sign off SLP spoke with patient and spouse in room and both report that patient's speech and language difficulties have resolved. SLP screened and did not detect any speech, language or cognitive deficits at this time.  Angela Nevin, MA, CCC-SLP Speech Therapy

## 2024-01-17 DIAGNOSIS — I428 Other cardiomyopathies: Secondary | ICD-10-CM | POA: Diagnosis not present

## 2024-01-17 DIAGNOSIS — I63511 Cerebral infarction due to unspecified occlusion or stenosis of right middle cerebral artery: Secondary | ICD-10-CM | POA: Diagnosis not present

## 2024-01-17 DIAGNOSIS — I10 Essential (primary) hypertension: Secondary | ICD-10-CM | POA: Diagnosis not present

## 2024-01-17 DIAGNOSIS — F1210 Cannabis abuse, uncomplicated: Secondary | ICD-10-CM | POA: Diagnosis not present

## 2024-01-17 LAB — CUP PACEART REMOTE DEVICE CHECK
Date Time Interrogation Session: 20250326110013
Implantable Pulse Generator Implant Date: 20250218

## 2024-01-17 LAB — ANA W/REFLEX IF POSITIVE: Anti Nuclear Antibody (ANA): NEGATIVE

## 2024-01-17 NOTE — Discharge Summary (Addendum)
 Stroke Discharge Summary  Patient ID: Devin Robert Burundi Jr.    l   MRN: [id number]      DOB: [birth year]  Date of Admission: 01/16/2024 Date of Discharge: 01/17/2024  Attending Physician:  Stroke, Md, MD Consultant(s):    None  Patient's PCP:  Merri Brunette, MD  DISCHARGE PRIMARY DIAGNOSIS:   Acute Ischemic Infarct:  right MCA branch Etiology: Cryptogenic  Patient Active Problem List   Diagnosis Date Noted   Acute right MCA stroke (HCC) 01/16/2024   Substance use 12/11/2023   Acute ischemic stroke (HCC) 12/09/2023   Acute ST elevation myocardial infarction (STEMI) of inferolateral wall (HCC) 09/14/2023   CAD (coronary artery disease), native coronary artery 01/23/2019   Obesity (BMI 35.0-39.9 without comorbidity) 01/23/2019   Chronic combined systolic and diastolic HF (heart failure) (HCC) 01/23/2019   Ischemic cardiomyopathy following anterior STEMI 12/23/2018   Acute combined systolic and diastolic heart failure (HCC) 12/23/2018   Acute ST elevation myocardial infarction (STEMI) involving left anterior descending (LAD) coronary artery (HCC) 12/21/2018   Hyperlipidemia LDL goal <70 12/21/2018   Palpitations 02/17/2014   Essential hypertension 02/17/2014     Allergies as of 01/17/2024   No Known Allergies      Medication List     STOP taking these medications    OCEANIC-STROKE asundexian or placebo 50 mg tablet       TAKE these medications    Aspirin Low Dose 81 MG chewable tablet Generic drug: aspirin Chew 1 tablet (81 mg total) by mouth daily.   atorvastatin 40 MG tablet Commonly known as: LIPITOR Take 1 tablet (40 mg total) by mouth daily. What changed: when to take this   carvedilol 6.25 MG tablet Commonly known as: COREG Take 1 tablet (6.25 mg total) by mouth 2 (two) times daily with a meal.   losartan 50 MG tablet Commonly known as: COZAAR Take 1 tablet (50 mg total) by mouth daily.   nitroGLYCERIN 0.4 MG SL tablet Commonly known as:  NITROSTAT Place 1 tablet (0.4 mg total) under the tongue every 5 (five) minutes as needed for chest pain.   Praluent 150 MG/ML Soaj Generic drug: Alirocumab Inject 1 pen under the skin every 14 days.   spironolactone 25 MG tablet Commonly known as: ALDACTONE Take 0.5 tablets (12.5 mg total) by mouth daily.   ticagrelor 90 MG Tabs tablet Commonly known as: BRILINTA Take 1 tablet (90 mg total) by mouth 2 (two) times daily.        LABORATORY STUDIES CBC    Component Value Date/Time   WBC 7.1 01/16/2024 1027   RBC 5.87 (H) 01/16/2024 1027   HGB 16.7 01/16/2024 1027   HGB 17.7 01/06/2019 1313   HCT 48.8 01/16/2024 1027   HCT 51.7 (H) 01/06/2019 1313   PLT 202 01/16/2024 1027   PLT 317 01/06/2019 1313   MCV 83.1 01/16/2024 1027   MCV 86 01/06/2019 1313   MCH 28.4 01/16/2024 1027   MCHC 34.2 01/16/2024 1027   RDW 12.4 01/16/2024 1027   RDW 12.7 01/06/2019 1313   LYMPHSABS 2.1 01/16/2024 0010   MONOABS 0.9 01/16/2024 0010   EOSABS 0.2 01/16/2024 0010   BASOSABS 0.1 01/16/2024 0010   CMP    Component Value Date/Time   NA 141 01/16/2024 0013   NA 141 10/19/2022 0929   K 3.5 01/16/2024 0013   CL 104 01/16/2024 0013   CO2 24 01/16/2024 0010   GLUCOSE 92 01/16/2024 0013   BUN 13 01/16/2024 0013  BUN 16 10/19/2022 0929   CREATININE 1.07 01/16/2024 1027   CALCIUM 8.5 (L) 01/16/2024 0010   PROT 5.9 (L) 01/16/2024 0010   PROT 5.9 (L) 10/19/2022 0929   ALBUMIN 3.8 01/16/2024 0010   ALBUMIN 4.4 10/19/2022 0929   AST 29 01/16/2024 0010   ALT 46 (H) 01/16/2024 0010   ALKPHOS 76 01/16/2024 0010   BILITOT 0.7 01/16/2024 0010   BILITOT 0.8 10/19/2022 0929   GFRNONAA >60 01/16/2024 1027   GFRNONAA 75 12/21/2023 1116   GFRAA 98 06/24/2019 0818   COAGS Lab Results  Component Value Date   INR 1.0 01/16/2024   INR 1.1 12/09/2023   INR 1.4 (H) 09/14/2023   Lipid Panel    Component Value Date/Time   CHOL 124 12/09/2023 2306   CHOL 114 10/19/2022 0929   TRIG 159 (H)  12/09/2023 2306   HDL 28 (L) 12/09/2023 2306   HDL 36 (L) 10/19/2022 0929   CHOLHDL 4.4 12/09/2023 2306   VLDL 32 12/09/2023 2306   LDLCALC 64 12/09/2023 2306   LDLCALC 54 10/19/2022 0929   HgbA1C  Lab Results  Component Value Date   HGBA1C 4.9 12/11/2023   Alcohol Level    Component Value Date/Time   ETH <10 01/16/2024 0010     SIGNIFICANT DIAGNOSTIC STUDIES CUP PACEART REMOTE DEVICE CHECK Result Date: 01/17/2024 ILR summary report received. Battery status OK. Normal device function. No new symptom, tachy, brady, or pause episodes. No new AF episodes. Monthly summary reports and ROV/PRN 1 false pause, undersensing LA CVRS  CT CHEST ABDOMEN PELVIS W CONTRAST Result Date: 01/16/2024 CLINICAL DATA:  Acute degenerate stroke.  Rule out malignancy. EXAM: CT CHEST, ABDOMEN, AND PELVIS WITH CONTRAST TECHNIQUE: Multidetector CT imaging of the chest, abdomen and pelvis was performed following the standard protocol during bolus administration of intravenous contrast. RADIATION DOSE REDUCTION: This exam was performed according to the departmental dose-optimization program which includes automated exposure control, adjustment of the mA and/or kV according to patient size and/or use of iterative reconstruction technique. CONTRAST:  75mL OMNIPAQUE IOHEXOL 350 MG/ML SOLN COMPARISON:  CT abdomen pelvis dated 12/24/2017. FINDINGS: CT CHEST FINDINGS Cardiovascular: There is no cardiomegaly or pericardial effusion. There is coronary vascular calcification of the LAD or stent. Mild atherosclerotic calcification of the aortic arch. No aneurysmal dilatation or dissection. The origins of the great vessels of the aortic arch and the central pulmonary arteries appear patent. Mediastinum/Nodes: No hilar or mediastinal adenopathy. The esophagus and the thyroid gland are grossly unremarkable. No mediastinal fluid collection. Lungs/Pleura: No focal consolidation, pleural effusion, pneumothorax. The central airways are  patent. Musculoskeletal: No acute osseous pathology. CT ABDOMEN PELVIS FINDINGS No intra-abdominal free air or free fluid. Hepatobiliary: The liver is unremarkable. No biliary dilatation. The gallbladder is unremarkable. Pancreas: Unremarkable. No pancreatic ductal dilatation or surrounding inflammatory changes. Spleen: Normal in size without focal abnormality. Adrenals/Urinary Tract: The adrenal glands are unremarkable. The kidneys, visualized ureters, and urinary bladder are unremarkable. Stomach/Bowel: There is no bowel obstruction or active inflammation. The appendix is normal. Vascular/Lymphatic: Mild aortoiliac atherosclerotic disease. The IVC is unremarkable. No portal venous gas. There is no adenopathy. Reproductive: The prostate and seminal vesicles are grossly unremarkable no pelvic mass. Other: Small fat containing left inguinal hernia. No bowel herniation. Musculoskeletal: No acute or significant osseous findings. IMPRESSION: No acute intrathoracic, abdominal, or pelvic pathology. No evidence of malignancy. Electronically Signed   By: Elgie Collard M.D.   On: 01/16/2024 16:07   MR BRAIN WO CONTRAST Result Date:  01/16/2024 CLINICAL DATA:  Neuro deficit with stroke suspected EXAM: MRI HEAD WITHOUT CONTRAST TECHNIQUE: Multiplanar, multiecho pulse sequences of the brain and surrounding structures were obtained without intravenous contrast. COMPARISON:  CTA from earlier today and brain MRI 12/09/2023 FINDINGS: Brain: Moderate area of restricted diffusion in the anterior right frontal lobe without FLAIR signal abnormality. Expected evolution of recently seen contralateral left frontal infarct with T1 hyperintensity from cortical laminar necrosis. There is a small area of left frontal parietal cortical restricted diffusion. No acute hemorrhage, hydrocephalus, mass, or collection. Vascular: Major flow voids are preserved. Skull and upper cervical spine: Normal marrow signal Sinuses/Orbits: Negative These  results were called by telephone at the time of interpretation on 01/16/2024 at 4:17 am to provider [facility] , who verbally acknowledged these results. IMPRESSION: Moderate area of restricted diffusion in the right frontal lobe matching the diseased right MCA branch vessel by CTA. Hyperintensity is not yet seen on FLAIR. Small acute infarct in the left frontal parietal cortex. Expected evolution of left frontal infarct seen last month. Electronically Signed   By: Tiburcio Pea M.D.   On: 01/16/2024 04:18   CT ANGIO HEAD NECK W WO CM (CODE STROKE) Result Date: 01/16/2024 CLINICAL DATA:  Neuro deficit, acute, stroke suspected EXAM: CT ANGIOGRAPHY HEAD AND NECK WITH AND WITHOUT CONTRAST TECHNIQUE: Multidetector CT imaging of the head and neck was performed using the standard protocol during bolus administration of intravenous contrast. Multiplanar CT image reconstructions and MIPs were obtained to evaluate the vascular anatomy. Carotid stenosis measurements (when applicable) are obtained utilizing NASCET criteria, using the distal internal carotid diameter as the denominator. RADIATION DOSE REDUCTION: This exam was performed according to the departmental dose-optimization program which includes automated exposure control, adjustment of the mA and/or kV according to patient size and/or use of iterative reconstruction technique. CONTRAST:  60mL OMNIPAQUE IOHEXOL 350 MG/ML SOLN COMPARISON:  CTA head/neck February 16, 25. FINDINGS: CTA NECK FINDINGS Aortic arch: Great vessel origins are patent without significant stenosis. Right carotid system: No evidence of dissection, stenosis (50% or greater), or occlusion. Left carotid system: No evidence of dissection, stenosis (50% or greater), or occlusion. Vertebral arteries: Left dominant. No evidence of dissection, stenosis (50% or greater), or occlusion. Skeleton: No acute fracture on limited assessment. Other neck: No acute abnormality limited assessment. Upper  chest: Visualized lung apices are clear. Review of the MIP images confirms the above findings CTA HEAD FINDINGS Anterior circulation: Bilateral intracranial ICAs are patent without significant stenosis. Right M1 MCA is patent. Occluded versus severely stenotic proximal right M2 MCA with distal reconstitution. Left MCA and bilateral ACAs are patent. Posterior circulation: Bilateral intradural vertebral arteries, basilar artery and bilateral posterior cerebral arteries are patent without proximal hemodynamically significant stenosis. Venous sinuses: As permitted by contrast timing, patent. Review of the MIP images confirms the above findings IMPRESSION: Occluded versus severely stenotic proximal right M2 MCA with distal reconstitution. Code stroke imaging results were communicated on 01/16/2024 at 12:20 am to provider Dr. Derry Lory via telephone, who verbally acknowledged these results. Electronically Signed   By: Feliberto Harts M.D.   On: 01/16/2024 00:33   CT HEAD CODE STROKE WO CONTRAST Result Date: 01/16/2024 CLINICAL DATA:  Code stroke.  Neuro deficit, acute, stroke suspected EXAM: CT HEAD WITHOUT CONTRAST TECHNIQUE: Contiguous axial images were obtained from the base of the skull through the vertex without intravenous contrast. RADIATION DOSE REDUCTION: This exam was performed according to the departmental dose-optimization program which includes automated exposure control, adjustment of the  mA and/or kV according to patient size and/or use of iterative reconstruction technique. COMPARISON:  None Available. FINDINGS: Brain: No evidence of acute large vascular territory infarction, hemorrhage, hydrocephalus, extra-axial collection or mass lesion/mass effect. Prior left anterior MCA territory infarcts. Vascular: Question hyperdense right M1/M2 MCA. Skull: No acute fracture. Sinuses/Orbits: Clear sinuses.  No acute orbital findings. ASPECTS [facility] Stroke Program Early CT Score) Total score (0-10 with 10  being normal): 10. IMPRESSION: 1. Question hyperdense right M1/M2 MCA.  Please see forthcoming CTA. 2. No evidence of acute large vascular territory infarct. ASPECTS is 10. 3. Prior left anterior MCA territory infarct. Code stroke imaging results were communicated on 01/16/2024 at 12:20 am to provider Dr. Derry Lory via telephone, who verbally acknowledged these results. Electronically Signed   By: Feliberto Harts M.D.   On: 01/16/2024 00:24       HISTORY OF PRESENT ILLNESS 53 y.o. patient with history of history of CAD s/p STEMI with stent placed, hypertension, hyperlipidemia, recent embolic cryptogenic stroke presented after a fall with left-sided weakness and left facial droop.  NIH on Admission 1   HOSPITAL COURSE Acute Ischemic Infarct:  right MCA Etiology: Cryptogenic Code Stroke CT head questionable hyperdense right M1 M2   CTA head & neck occluded versus severely stenotic right M2 MRI  moderate area of restricted diffusion in right frontal lobe 2D Echo 2/25  EF 45 to 50%.  LVH.  Right ventricle mildly enlarged 11/2023 TEE with EF 55%, Mild mitral valve regurgitation, Left Atrium: Normal, no left atrial appendage thrombus with no shunt  CT chest /abdomen/ pelvis negative for malignancy ANA negative LDL 64 HgbA1c 4.9 aspirin 81 mg daily and Brilinta (ticagrelor) 90 mg bid prior to admission, now on aspirin 81 mg daily and Brilinta (ticagrelor) 90 mg bid    Hx of Stroke/TIA February 2025 left embolic cryptogenic stroke  Full workup at that time including TCD with bubble negative, hypercoagulable panel negative Loop recorder placed and interrogated on this admission no A-fib   Hypertension CAD STEMI s/p stent Ischemic cardiomyopathy Home meds: Carvedilol 6.25 mg, losartan 50 mg Resume aspirin and Brilinta  Hyperlipidemia Home meds: Praluent, atorvastatin 40 mg,  resumed in hospital LDL 64, goal < 70 Continue statin at discharge   Other Stroke Risk Factors ETOH use, alcohol  level <10, advised to drink no more than 2 drink(s) a day Obesity, There is no height or weight on file to calculate BMI., BMI >/= 30 associated with increased stroke risk, recommend weight loss, diet and exercise as appropriate  Coronary artery disease Congestive heart failure Likely obstructive sleep apnea, will need outpatient follow-up   DISCHARGE EXAM  PHYSICAL EXAM General:  Alert, well-nourished, well-developed patient in no acute distress Psych:  Mood and affect appropriate for situation CV: Regular rate and rhythm on monitor Respiratory:  Regular, unlabored respirations on room air GI: Abdomen soft and nontender  NEURO:  Mental Status: AA&Ox3  Speech/Language: speech is without dysarthria or aphasia.  Naming, repetition, fluency, and comprehension intact.  Cranial Nerves:  II: PERRL. Visual fields full.  III, IV, VI: EOMI. Eyelids elevate symmetrically.  V: Sensation is intact to light touch and symmetrical to face.  VII: Smile is symmetrical.  VIII: hearing intact to voice. IX, X: Palate elevates symmetrically. Phonation is normal.  ZO:[id number] shrug 5/5. XII: tongue is midline without fasciculations. Motor: 5/5 strength to all muscle groups tested.  Tone: is normal and bulk is normal Sensation- Intact to light touch bilaterally. Extinction absent to light touch  to DSS. Coordination: FTN intact bilaterally, HKS: no ataxia in BLE.No drift.  Gait- deferred  Discharge Diet       Diet   Diet Heart Room service appropriate? Yes; Fluid consistency: Thin   liquids  DISCHARGE PLAN Disposition: Home aspirin 81 mg daily and Brilinta (ticagrelor) 90 mg bid for secondary stroke prevention  Ongoing stroke risk factor control by Primary Care Physician at time of discharge Follow-up PCP Merri Brunette, MD in 2 weeks. Follow up with Dr. Vickey Huger at [facility] in 2 to 4 weeks for evaluation of sleep apnea Keep already scheduled appointment with [facility]   50 minutes were spent preparing discharge.  Gevena Mart DNP, ACNPC-AG  [facility]  I have personally obtained history,examined this patient, reviewed notes, independently viewed imaging studies, participated in medical decision making and plan of care.ROS completed by me personally and pertinent positives fully documented  I have made any additions or clarifications directly to the above note. Agree with note above.  Patient reported prior cryptogenic stroke presented with sudden onset of left hemiparesis secondary to right MCA branch infarct of cryptogenic etiology.  He appears to be having extensive negative workup including loop recorder which was interrogated during this admission and was negative for paroxysmal A-fib.  Patient underwent CT scan of chest abdomen pelvis was negative for malignancy.  Recommend continue aspirin and Brilinta given significant coronary artery disease.  Patient appears to be at risk for obstructive sleep apnea and will refer to outpatient sleep physician for evaluation and treatment for the same.  Delia Heady, MD Medical Director [facility] Pager: [phone] 01/17/2024 5:20 PM

## 2024-01-17 NOTE — TOC Transition Note (Signed)
 Transition of Care [facility]) - Discharge Note   Patient Details  Name: Devin Robert Burundi Jr. MRN: [id number] Date of Birth: Dec 26, 1970  [facility]) CM/SW Contact:  Kermit Balo, RN Phone Number: 01/17/2024, 1:13 PM   Clinical Narrative:     Pt is discharging home with self care. No follow up per PT.  Pt has transportation home.  Final next level of care: Home/Self Care Barriers to Discharge: No Barriers Identified   Patient Goals and CMS Choice            Discharge Placement                       Discharge Plan and Services Additional resources added to the After Visit Summary for                                       Social Drivers of Health (SDOH) Interventions SDOH Screenings   Food Insecurity: No Food Insecurity (01/16/2024)  Housing: Low Risk  (01/16/2024)  Transportation Needs: No Transportation Needs (01/16/2024)  Utilities: Not At Risk (01/16/2024)  Depression (PHQ2-9): Low Risk  (10/02/2023)  Tobacco Use: Low Risk  (01/16/2024)     Readmission Risk Interventions    01/16/2024    1:45 PM  Readmission Risk Prevention Plan  Transportation Screening Complete  PCP or Specialist Appt within 5-7 Days Complete  Home Care Screening Complete  Medication Review (RN CM) Referral to Pharmacy

## 2024-01-17 NOTE — TOC CAGE-AID Note (Signed)
 Transition of Care [facility]) - CAGE-AID Screening   Patient Details  Name: Devin Robert Burundi Jr. MRN: [id number] Date of Birth: [birth year]  Transition of Care [facility]) CM/SW Contact:    Kermit Balo, RN Phone Number: 01/17/2024, 12:20 PM   Clinical Narrative:  Pt denies the need for inpatient/ outpatient counseling resources  CAGE-AID Screening:

## 2024-01-18 ENCOUNTER — Telehealth: Payer: Self-pay

## 2024-01-18 NOTE — Transitions of Care (Post Inpatient/ED Visit) (Signed)
   01/18/2024  Name: Devin Tardif Burundi Jr. MRN: [id number] DOB: Apr 09, 1971  Today's TOC FU Call Status: Today's TOC FU Call Status:: Unsuccessful Call (1st Attempt) Unsuccessful Call (1st Attempt) Date: 01/18/24  Attempted to reach the patient regarding the most recent Inpatient/ED visit.  Follow Up Plan: Additional outreach attempts will be made to reach the patient to complete the Transitions of Care (Post Inpatient/ED visit) call.   Deidre Ala, BSN, RN [place]  VBCI - [employer] [facility] RN Care Manager [phone]

## 2024-01-21 ENCOUNTER — Telehealth: Payer: Self-pay

## 2024-01-21 NOTE — Transitions of Care (Post Inpatient/ED Visit) (Signed)
   01/21/2024  Name: Devin Robert Burundi Jr. MRN: [id number] DOB: 12/30/1970  Today's TOC FU Call Status: Today's TOC FU Call Status:: Unsuccessful Call (2nd Attempt) Unsuccessful Call (1st Attempt) Date: 01/18/24 Unsuccessful Call (2nd Attempt) Date: 01/21/24  Attempted to reach the patient regarding the most recent Inpatient/ED visit.  Follow Up Plan: Additional outreach attempts will be made to reach the patient to complete the Transitions of Care (Post Inpatient/ED visit) call.   Deidre Ala, BSN, RN [place]  VBCI - [employer] [facility] RN Care Manager [phone]

## 2024-01-22 ENCOUNTER — Telehealth: Payer: Self-pay | Admitting: *Deleted

## 2024-01-22 NOTE — Transitions of Care (Post Inpatient/ED Visit) (Signed)
   01/22/2024  Name: Devin Robert Burundi Jr. MRN: [id number] DOB: 1970/12/11  Today's TOC FU Call Status: Today's TOC FU Call Status:: Unsuccessful Call (3rd Attempt) Unsuccessful Call (3rd Attempt) Date: 01/22/24  Attempted to reach the patient regarding the most recent Inpatient/ED visit.  Follow Up Plan: No further outreach attempts will be made at this time. We have been unable to contact the patient.  Gean Maidens BSN RN [facility] Care Management Coordinator [email or web address] Direct Dial: [phone]  Fax: [phone] Website: [email or web address]

## 2024-01-25 ENCOUNTER — Encounter: Payer: Self-pay | Admitting: Cardiology

## 2024-01-28 ENCOUNTER — Encounter: Payer: Self-pay | Admitting: Cardiovascular Disease

## 2024-01-31 DIAGNOSIS — Z13220 Encounter for screening for lipoid disorders: Secondary | ICD-10-CM | POA: Diagnosis not present

## 2024-01-31 DIAGNOSIS — E7211 Homocystinuria: Secondary | ICD-10-CM | POA: Diagnosis not present

## 2024-01-31 DIAGNOSIS — E559 Vitamin D deficiency, unspecified: Secondary | ICD-10-CM | POA: Diagnosis not present

## 2024-01-31 DIAGNOSIS — M810 Age-related osteoporosis without current pathological fracture: Secondary | ICD-10-CM | POA: Diagnosis not present

## 2024-01-31 DIAGNOSIS — E039 Hypothyroidism, unspecified: Secondary | ICD-10-CM | POA: Diagnosis not present

## 2024-01-31 DIAGNOSIS — R739 Hyperglycemia, unspecified: Secondary | ICD-10-CM | POA: Diagnosis not present

## 2024-02-04 ENCOUNTER — Telehealth: Payer: Self-pay

## 2024-02-04 ENCOUNTER — Encounter: Payer: Self-pay | Admitting: Neurology

## 2024-02-04 DIAGNOSIS — I639 Cerebral infarction, unspecified: Secondary | ICD-10-CM

## 2024-02-04 NOTE — Transitions of Care (Post Inpatient/ED Visit) (Signed)
  [facility]) Care Management Telephonic RN Care Manager Note   02/04/2024 Name:  Devin Lamadrid Burundi Jr. MRN:  [id number] DOB:  [birth year]  Summary: Patient with 2 recent strokes.  Red emmi alert for no follow up.  Placed call to patient who reports that he could not get neurology follow up. Recommended by Dr. Janett Medin to be seen in 1 month. Patient was scheduled for June 10 weeks out.   Recommendations/Changes made from today's visit: I placed call to [facility] and left a message trying to get a timely appointment. Message back from [facility] stating patients appointment was move up per MD for 02/06/2024   Subjective: Devin Beza Burundi Jr. is an 53 y.o. year old male who is a primary patient of Devin Man, MD. The care management team was consulted for assistance with care management and/or care coordination needs.    Emmi red flag RN  completed Telephone Visit today.  Objective: No follow up with neurology. Red flag on Emmi Call.  Patient reports that he was not able to get a timely follow up appointment. MD suggested  ( 4/27)  Patient continues to have headaches. Has follow up planned with Pcp and hematology in the next 2 weeks.  Patient reports that he has all his medications and is taking his medications as prescribed.       SDOH:  (Social Determinants of Health) assessments and interventions performed:  SDOH Interventions    Flowsheet Row ED to Hosp-Admission (Discharged) from 01/16/2024 in [place] [facility] Telephone from 12/13/2023 in [place] POPULATION HEALTH DEPARTMENT INTENSIVE CARDIAC REHAB ORIENT from 10/02/2023 in [facility] Telephone from 09/17/2023 in [place] POPULATION HEALTH DEPARTMENT  SDOH Interventions      Food Insecurity Interventions -- Intervention Not Indicated -- Intervention Not Indicated  Housing Interventions -- Intervention Not Indicated --  Intervention Not Indicated  Transportation Interventions Inpatient TOC, Intervention Not Indicated, Patient Resources (Friends/Family) Intervention Not Indicated -- Intervention Not Indicated  Utilities Interventions -- Intervention Not Indicated -- Intervention Not Indicated  Depression Interventions/Treatment  -- -- WUJ8-1 Score <4 Follow-up Not Indicated --         Plan:  Follow up with provider re: CVA on 02/06/2024 Orpha Blade, RN, BSN, CEN Population Health- Transition of Care Team.  [facility] [phone]

## 2024-02-04 NOTE — Transitions of Care (Post Inpatient/ED Visit) (Deleted)
   02/04/2024  Name: Devin Robert Burundi Jr. MRN: [id number] DOB: [birth year]  {[id number]}

## 2024-02-04 NOTE — Telephone Encounter (Signed)
 Error

## 2024-02-06 ENCOUNTER — Encounter: Payer: Self-pay | Admitting: Neurology

## 2024-02-06 ENCOUNTER — Ambulatory Visit: Admitting: Neurology

## 2024-02-06 VITALS — BP 114/76 | HR 69 | Ht 74.0 in | Wt 259.6 lb

## 2024-02-06 DIAGNOSIS — I639 Cerebral infarction, unspecified: Secondary | ICD-10-CM | POA: Diagnosis not present

## 2024-02-06 DIAGNOSIS — E785 Hyperlipidemia, unspecified: Secondary | ICD-10-CM | POA: Diagnosis not present

## 2024-02-06 DIAGNOSIS — G459 Transient cerebral ischemic attack, unspecified: Secondary | ICD-10-CM

## 2024-02-06 DIAGNOSIS — E669 Obesity, unspecified: Secondary | ICD-10-CM

## 2024-02-06 NOTE — Patient Instructions (Signed)
 I had a long d/w patient about his recent  recurrent cryptogenic strokes, risk for recurrent stroke/TIAs, personally independently reviewed imaging studies and stroke evaluation results and answered questions.Continue aspirin 81 mg daily and Brilinta (ticagrelor) 90 mg bid  for secondary stroke prevention given history of significant coronary artery disease.  And maintain strict control of hypertension with blood pressure goal below 130/90, diabetes with hemoglobin A1c goal below 6.5% and lipids with LDL cholesterol goal below 70 mg/dL. I also advised the patient to eat a healthy diet with plenty of whole grains, cereals, fruits and vegetables, exercise regularly and maintain ideal body weight.  I have advised the patient to keep his appointment for evaluation for polysomnogram for sleep apnea.  Continue participation in the [place] stroke trial even though he is not on the study medication since he met study outcome.  Followup in the future with me in 6 months or call earlier if necessary .Stroke Prevention Some medical conditions and behaviors can lead to a higher chance of having a stroke. You can help prevent a stroke by eating healthy, exercising, not smoking, and managing any medical conditions you have. Stroke is a leading cause of functional impairment. Primary prevention is particularly important because a majority of strokes are first-time events. Stroke changes the lives of not only those who experience a stroke but also their family and other caregivers. How can this condition affect me? A stroke is a medical emergency and should be treated right away. A stroke can lead to brain damage and can sometimes be life-threatening. If a person gets medical treatment right away, there is a better chance of surviving and recovering from a stroke. What can increase my risk? The following medical conditions may increase your risk of a stroke: Cardiovascular disease. High blood pressure  (hypertension). Diabetes. High cholesterol. Sickle cell disease. Blood clotting disorders (hypercoagulable state). Obesity. Sleep disorders (obstructive sleep apnea). Other risk factors include: Being older than age 67. Having a history of blood clots, stroke, or mini-stroke (transient ischemic attack, TIA). Genetic factors, such as race, ethnicity, or a family history of stroke. Smoking cigarettes or using other tobacco products. Taking birth control pills, especially if you also use tobacco. Heavy use of alcohol or drugs, especially cocaine and methamphetamine. Physical inactivity. What actions can I take to prevent this? Manage your health conditions High cholesterol levels. Eating a healthy diet is important for preventing high cholesterol. If cholesterol cannot be managed through diet alone, you may need to take medicines. Take any prescribed medicines to control your cholesterol as told by your health care provider. Hypertension. To reduce your risk of stroke, try to keep your blood pressure below 130/80. Eating a healthy diet and exercising regularly are important for controlling blood pressure. If these steps are not enough to manage your blood pressure, you may need to take medicines. Take any prescribed medicines to control hypertension as told by your health care provider. Ask your health care provider if you should monitor your blood pressure at home. Have your blood pressure checked every year, even if your blood pressure is normal. Blood pressure increases with age and some medical conditions. Diabetes. Eating a healthy diet and exercising regularly are important parts of managing your blood sugar (glucose). If your blood sugar cannot be managed through diet and exercise, you may need to take medicines. Take any prescribed medicines to control your diabetes as told by your health care provider. Get evaluated for obstructive sleep apnea. Talk to your health care provider  about getting a sleep evaluation if you snore a lot or have excessive sleepiness. Make sure that any other medical conditions you have, such as atrial fibrillation or atherosclerosis, are managed. Nutrition Follow instructions from your health care provider about what to eat or drink to help manage your health condition. These instructions may include: Reducing your daily calorie intake. Limiting how much salt (sodium) you use to 1,500 milligrams (mg) each day. Using only healthy fats for cooking, such as olive oil, canola oil, or sunflower oil. Eating healthy foods. You can do this by: Choosing foods that are high in fiber, such as whole grains, and fresh fruits and vegetables. Eating at least 5 servings of fruits and vegetables a day. Try to fill one-half of your plate with fruits and vegetables at each meal. Choosing lean protein foods, such as lean cuts of meat, poultry without skin, fish, tofu, beans, and nuts. Eating low-fat dairy products. Avoiding foods that are high in sodium. This can help lower blood pressure. Avoiding foods that have saturated fat, trans fat, and cholesterol. This can help prevent high cholesterol. Avoiding processed and prepared foods. Counting your daily carbohydrate intake.  Lifestyle If you drink alcohol: Limit how much you have to: 0-1 drink a day for women who are not pregnant. 0-2 drinks a day for men. Know how much alcohol is in your drink. In the [place] one drink equals one 12 oz bottle of beer ( ), one 5 oz glass of wine ( ), or one 1 oz glass of hard liquor (44mL). Do not use any products that contain nicotine or tobacco. These products include cigarettes, chewing tobacco, and vaping devices, such as e-cigarettes. If you need help quitting, ask your health care provider. Avoid secondhand smoke. Do not use drugs. Activity  Try to stay at a healthy weight. Get at least 30 minutes of exercise on most days, such as: Fast  walking. Biking. Swimming. Medicines Take over-the-counter and prescription medicines only as told by your health care provider. Aspirin or blood thinners (antiplatelets or anticoagulants) may be recommended to reduce your risk of forming blood clots that can lead to stroke. Avoid taking birth control pills. Talk to your health care provider about the risks of taking birth control pills if: You are over 42 years old. You smoke. You get very bad headaches. You have had a blood clot. Where to find more information American Stroke Association: www.strokeassociation.org Get help right away if: You or a loved one has any symptoms of a stroke. "BE FAST" is an easy way to remember the main warning signs of a stroke: B - Balance. Signs are dizziness, sudden trouble walking, or loss of balance. E - Eyes. Signs are trouble seeing or a sudden change in vision. F - Face. Signs are sudden weakness or numbness of the face, or the face or eyelid drooping on one side. A - Arms. Signs are weakness or numbness in an arm. This happens suddenly and usually on one side of the body. S - Speech. Signs are sudden trouble speaking, slurred speech, or trouble understanding what people say. T - Time. Time to call emergency services. Write down what time symptoms started. You or a loved one has other signs of a stroke, such as: A sudden, severe headache with no known cause. Nausea or vomiting. Seizure. These symptoms may represent a serious problem that is an emergency. Do not wait to see if the symptoms will go away. Get medical help right away. Call your local emergency services (  911 in the [place] Do not drive yourself to the hospital. Summary You can help to prevent a stroke by eating healthy, exercising, not smoking, limiting alcohol intake, and managing any medical conditions you may have. Do not use any products that contain nicotine or tobacco. These include cigarettes, chewing tobacco, and vaping devices,  such as e-cigarettes. If you need help quitting, ask your health care provider. Remember "BE FAST" for warning signs of a stroke. Get help right away if you or a loved one has any of these signs. This information is not intended to replace advice given to you by your health care provider. Make sure you discuss any questions you have with your health care provider. Document Revised: 09/11/2022 Document Reviewed: 09/11/2022 Elsevier Patient Education  2024 [employer].

## 2024-02-06 NOTE — Progress Notes (Signed)
 Devin Wright [address] [place]. [place] [address] [phone]       OFFICE FOLLOW-UP NOTE  Mr. Devin Robert Burundi Jr. Date of Birth:  [birth year] Medical Record Number:  [id number]   HPI: Mr. Burundi is a 53 year old Caucasian male seen today for initial office follow-up visit following hospital consultation for stroke.  History is obtained from the patient and his wife and personally reviewed electronic medical records and pertinent available imaging films in PACS. Devin Wecker Burundi Jr. is a 53 y.o. male with hx of hypertension, hyperlipidemia, STEMI with PCI to his LAD now on Brilinta and aspirin who presented on 12/09/2023 with abrupt onset of difficulty speaking that afternoon.  He stated that his last actual conversation and was with his wife before she left to go shopping around 3:30 PM.  When she returned around 6 PM, he was having difficulty speaking and his children and noticed it at least as early as 4:30 PM.  Due to the symptoms a code stroke was activated and he was evaluated emergently.  He was taken for CT which revealed concern for acute left MCA infarct involving the anterior left insula and overlying left frontal lobe.  Aspect score was 8 Dr. Amada Jupiter discussed the risks, benefits, and alternatives of IV TNK with the patient and his wife.  Given the relatively mild nature of his deficits, the patient chose to not proceed with IV TNK .  CT angiogram showed acute occlusion of distal left M2/M3 branch.  MRI scan confirmed left MCA distribution infarct involving anterior left insular cortex and overlying left frontal operculum with some petechial blood products.  Lower extremity venous Dopplers were negative for DVT.  2D echo showed ejection fraction to be slightly reduced at 45 to 50% with left ventricular hypertrophy.  TCD bubble study was negative for right-to-left shunt.  TEE showed ejection fraction of 55% with no clot or PFO.  LDL cholesterol 64 mg percent  hemoglobin A1c was 5.0.  Urine drug screen was positive for THC.  Patient had loop recorder inserted prior to discharge and so far paroxysmal A-fib has not yet been found.  Hypercoagulable labs were negative.  Patient was on aspirin and Brilinta prior to admission which was continued and patient signed consent and was enrolled in the [facility] stroke prevention trial( Asundexian-factor XI inhibitor versus placebo ) and was discharged home.  He unfortunately returned on 01/16/2024 with new onset left facial droop and left-sided weakness.  NIH stroke scale was 1.  CT head was not negative but CT angiogram showed severely stenotic right M2.  MRI showed moderate area of restricted diffusion in the right frontal lobe.  LDL cholesterol 64 mg percent.  Hemoglobin A1c was 4.9.  CT scan chest abdomen pelvis was negative for malignancy.  ANA was negative.  There were [place] stroke study medication was discontinued as patient met study endpoint.  Aspirin and Brilinta were continued.  Patient states he has done well since discharge.  He is back to work and can work full-time.  He still has subtle left facial droop which is improving.  He complains of mild pressure behind his head and his eyes but it is improving.  Patient now informs me that he had a transient episode of 30 minutes in which he had some left-sided visual field defects even prior to his second stroke but he did not seek medical help for that.  He denies any history of palpitations cardiac arrhythmia syncope.  Loop recorder has so far not  yet demonstrated paroxysmal A-fib.  Patient was referred to the sleep lab for polysomnogram but is yet to call them and schedule an appointment. ROS:   14 system review of systems is positive for facial droop, speech difficulties, bruising, vision difficulties all other systems negative  PMH:  Past Medical History:  Diagnosis Date   Coronary artery disease    Family history of heart disease    HTN (hypertension)     Hyperlipidemia    Palpitations    STEMI (ST elevation myocardial infarction) (HCC)    PCI/DES to p/mLAD, 60% lcx, 40% ostial LM, and 60% PDA, EF 45%    Social History:  Social History   Socioeconomic History   Marital status: Married    Spouse name: Not on file   Number of children: Not on file   Years of education: Not on file   Highest education level: Not on file  Occupational History   Not on file  Tobacco Use   Smoking status: Never   Smokeless tobacco: Never  Substance and Sexual Activity   Alcohol use: Not on file   Drug use: Never   Sexual activity: Not on file  Other Topics Concern   Not on file  Social History Narrative   Not on file   Social Drivers of Health   Financial Resource Strain: Not on file  Food Insecurity: No Food Insecurity (01/16/2024)   Hunger Vital Sign    Worried About Running Out of Food in the Last Year: Never true    Ran Out of Food in the Last Year: Never true  Transportation Needs: No Transportation Needs (01/16/2024)   PRAPARE - Administrator, Civil Service (Medical): No    Lack of Transportation (Non-Medical): No  Physical Activity: Not on file  Stress: Not on file  Social Connections: Not on file  Intimate Partner Violence: Not At Risk (01/16/2024)   Humiliation, Afraid, Rape, and Kick questionnaire    Fear of Current or Ex-Partner: No    Emotionally Abused: No    Physically Abused: No    Sexually Abused: No    Medications:   Current Outpatient Medications on File Prior to Visit  Medication Sig Dispense Refill   aspirin 81 MG chewable tablet Chew 1 tablet (81 mg total) by mouth daily. 90 tablet 2   atorvastatin (LIPITOR) 40 MG tablet Take 1 tablet (40 mg total) by mouth daily. (Patient taking differently: Take 40 mg by mouth at bedtime.) 90 tablet 3   carvedilol (COREG) 6.25 MG tablet Take 1 tablet (6.25 mg total) by mouth 2 (two) times daily with a meal. 180 tablet 3   losartan (COZAAR) 50 MG tablet Take 1 tablet  (50 mg total) by mouth daily. 90 tablet 3   nitroGLYCERIN (NITROSTAT) 0.4 MG SL tablet Place 1 tablet (0.4 mg total) under the tongue every 5 (five) minutes as needed for chest pain. 75 tablet 2   PRALUENT 150 MG/ML SOAJ Inject 1 pen under the skin every 14 days. 6 mL 2   spironolactone (ALDACTONE) 25 MG tablet Take 0.5 tablets (12.5 mg total) by mouth daily. 45 tablet 3   ticagrelor (BRILINTA) 90 MG TABS tablet Take 1 tablet (90 mg total) by mouth 2 (two) times daily. 180 tablet 3   No current facility-administered medications on file prior to visit.    Allergies:  No Known Allergies  Physical Exam General: Mildly obese middle-age Caucasian male., seated, in no evident distress Head: head normocephalic and atraumatic.  Neck: supple with no carotid or supraclavicular bruits Cardiovascular: regular rate and rhythm, no murmurs Musculoskeletal: no deformity Skin:  no rash/petichiae Vascular:  Normal pulses all extremities Vitals:   02/06/24 0802  BP: 114/76  Pulse: 69   Neurologic Exam Mental Status: Awake and fully alert. Oriented to place and time. Recent and remote memory intact. Attention span, concentration and fund of knowledge appropriate. Mood and affect appropriate.  Cranial Nerves: Fundoscopic exam reveals sharp disc margins. Pupils equal, briskly reactive to light. Extraocular movements full without nystagmus. Visual fields full to confrontation. Hearing intact. Facial sensation intact.  Mild left nasolabial fold asymmetry., tongue, palate moves normally and symmetrically.  Motor: Normal bulk and tone. Normal strength in all tested extremity muscles. Sensory.: intact to touch ,pinprick .position and vibratory sensation.  Coordination: Rapid alternating movements normal in all extremities. Finger-to-nose and heel-to-shin performed accurately bilaterally. Gait and Station: Arises from chair without difficulty. Stance is normal. Gait demonstrates normal stride length and balance .  Able to heel, toe and tandem walk without difficulty.  Reflexes: 1+ and symmetric. Toes downgoing.   NIHSS  1 Modified Rankin  2   ASSESSMENT: 53 year old Caucasian male with embolic left MCA branch infarct in February 2025 as well as right MCA branch infarct in March 2025 and also possibly right hemispheric TIA in March as well of cryptogenic etiology.  Vascular risk factors of coronary artery disease, hypertension, hyperlipidemia     PLAN:I had a long d/w patient about his recent  recurrent cryptogenic strokes, risk for recurrent stroke/TIAs, personally independently reviewed imaging studies and stroke evaluation results and answered questions.Continue aspirin 81 mg daily and Brilinta (ticagrelor) 90 mg bid  for secondary stroke prevention given history of significant coronary artery disease.  And maintain strict control of hypertension with blood pressure goal below 130/90, diabetes with hemoglobin A1c goal below 6.5% and lipids with LDL cholesterol goal below 70 mg/dL. I also advised the patient to eat a healthy diet with plenty of whole grains, cereals, fruits and vegetables, exercise regularly and maintain ideal body weight.  I have advised the patient to keep his appointment for evaluation for polysomnogram for sleep apnea.  Continue participation in the [place] stroke trial even though he is not on the study medication since he met study outcome.  Followup in the future with me in 6 months or call earlier if necessary  Greater than 50% of time during this 40 minute visit was spent on counseling,explanation of diagnosis, planning of further management, discussion with patient and family and coordination of care Ardella Beaver, MD Note: This document was prepared with digital dictation and possible smart phrase technology. Any transcriptional errors that result from this process are unintentional

## 2024-02-07 DIAGNOSIS — Z125 Encounter for screening for malignant neoplasm of prostate: Secondary | ICD-10-CM | POA: Diagnosis not present

## 2024-02-12 DIAGNOSIS — I708 Atherosclerosis of other arteries: Secondary | ICD-10-CM | POA: Diagnosis not present

## 2024-02-12 DIAGNOSIS — I2510 Atherosclerotic heart disease of native coronary artery without angina pectoris: Secondary | ICD-10-CM | POA: Diagnosis not present

## 2024-02-12 DIAGNOSIS — R7401 Elevation of levels of liver transaminase levels: Secondary | ICD-10-CM | POA: Diagnosis not present

## 2024-02-12 DIAGNOSIS — Z0000 Encounter for general adult medical examination without abnormal findings: Secondary | ICD-10-CM | POA: Diagnosis not present

## 2024-02-12 DIAGNOSIS — I10 Essential (primary) hypertension: Secondary | ICD-10-CM | POA: Diagnosis not present

## 2024-02-14 ENCOUNTER — Other Ambulatory Visit

## 2024-02-14 ENCOUNTER — Encounter: Admitting: Oncology

## 2024-02-18 DIAGNOSIS — I509 Heart failure, unspecified: Secondary | ICD-10-CM | POA: Diagnosis not present

## 2024-02-18 DIAGNOSIS — R7402 Elevation of levels of lactic acid dehydrogenase (LDH): Secondary | ICD-10-CM | POA: Diagnosis not present

## 2024-02-18 DIAGNOSIS — M311 Thrombotic microangiopathy, unspecified: Secondary | ICD-10-CM | POA: Diagnosis not present

## 2024-02-19 ENCOUNTER — Other Ambulatory Visit: Payer: Self-pay | Admitting: Oncology

## 2024-02-19 DIAGNOSIS — D6859 Other primary thrombophilia: Secondary | ICD-10-CM

## 2024-02-20 ENCOUNTER — Inpatient Hospital Stay

## 2024-02-20 ENCOUNTER — Inpatient Hospital Stay (HOSPITAL_BASED_OUTPATIENT_CLINIC_OR_DEPARTMENT_OTHER): Admitting: Oncology

## 2024-02-20 ENCOUNTER — Ambulatory Visit

## 2024-02-20 ENCOUNTER — Inpatient Hospital Stay: Admitting: Oncology

## 2024-02-20 ENCOUNTER — Other Ambulatory Visit

## 2024-02-20 ENCOUNTER — Inpatient Hospital Stay: Attending: Oncology

## 2024-02-20 VITALS — BP 115/75 | HR 68 | Temp 98.1°F | Resp 17 | Wt 258.6 lb

## 2024-02-20 DIAGNOSIS — Z7982 Long term (current) use of aspirin: Secondary | ICD-10-CM | POA: Insufficient documentation

## 2024-02-20 DIAGNOSIS — I10 Essential (primary) hypertension: Secondary | ICD-10-CM | POA: Insufficient documentation

## 2024-02-20 DIAGNOSIS — I2510 Atherosclerotic heart disease of native coronary artery without angina pectoris: Secondary | ICD-10-CM

## 2024-02-20 DIAGNOSIS — Z8673 Personal history of transient ischemic attack (TIA), and cerebral infarction without residual deficits: Secondary | ICD-10-CM | POA: Insufficient documentation

## 2024-02-20 DIAGNOSIS — Z79899 Other long term (current) drug therapy: Secondary | ICD-10-CM | POA: Diagnosis not present

## 2024-02-20 DIAGNOSIS — D6859 Other primary thrombophilia: Secondary | ICD-10-CM

## 2024-02-20 DIAGNOSIS — E785 Hyperlipidemia, unspecified: Secondary | ICD-10-CM | POA: Diagnosis not present

## 2024-02-20 DIAGNOSIS — E7841 Elevated Lipoprotein(a): Secondary | ICD-10-CM | POA: Insufficient documentation

## 2024-02-20 DIAGNOSIS — Z7902 Long term (current) use of antithrombotics/antiplatelets: Secondary | ICD-10-CM | POA: Diagnosis not present

## 2024-02-20 LAB — CMP (CANCER CENTER ONLY)
ALT: 40 U/L (ref 0–44)
AST: 27 U/L (ref 15–41)
Albumin: 4.5 g/dL (ref 3.5–5.0)
Alkaline Phosphatase: 84 U/L (ref 38–126)
Anion gap: 4 — ABNORMAL LOW (ref 5–15)
BUN: 12 mg/dL (ref 6–20)
CO2: 30 mmol/L (ref 22–32)
Calcium: 9.1 mg/dL (ref 8.9–10.3)
Chloride: 106 mmol/L (ref 98–111)
Creatinine: 1.07 mg/dL (ref 0.61–1.24)
GFR, Estimated: >60 mL/min (ref >60)
Glucose, Bld: 80 mg/dL (ref 70–99)
Potassium: 4.4 mmol/L (ref 3.5–5.1)
Sodium: 140 mmol/L (ref 135–145)
Total Bilirubin: 0.8 mg/dL (ref 0.0–1.2)
Total Protein: 6.5 g/dL (ref 6.5–8.1)

## 2024-02-20 LAB — CBC WITH DIFFERENTIAL (CANCER CENTER ONLY)
Abs Immature Granulocytes: 0.02 10*3/uL (ref 0.00–0.07)
Basophils Absolute: 0 10*3/uL (ref 0.0–0.1)
Basophils Relative: 1 %
Eosinophils Absolute: 0.1 10*3/uL (ref 0.0–0.5)
Eosinophils Relative: 2 %
HCT: 44 % (ref 39.0–52.0)
Hemoglobin: 15.4 g/dL (ref 13.0–17.0)
Immature Granulocytes: 0 %
Lymphocytes Relative: 14 %
Lymphs Abs: 0.8 10*3/uL (ref 0.7–4.0)
MCH: 28.8 pg (ref 26.0–34.0)
MCHC: 35 g/dL (ref 30.0–36.0)
MCV: 82.2 fL (ref 80.0–100.0)
Monocytes Absolute: 0.5 10*3/uL (ref 0.1–1.0)
Monocytes Relative: 8 %
Neutro Abs: 4.3 10*3/uL (ref 1.7–7.7)
Neutrophils Relative %: 75 %
Platelet Count: 211 10*3/uL (ref 150–400)
RBC: 5.35 MIL/uL (ref 4.22–5.81)
RDW: 12.6 % (ref 11.5–15.5)
WBC Count: 5.7 10*3/uL (ref 4.0–10.5)
nRBC: 0 % (ref 0.0–0.2)

## 2024-02-20 LAB — D-DIMER, QUANTITATIVE: D-Dimer, Quant: <0.27 ug{FEU}/mL (ref 0.00–0.50)

## 2024-02-20 NOTE — Progress Notes (Signed)
 [facility]  HEMATOLOGY CLINIC CONSULTATION NOTE   PATIENT NAME: Devin Robert Burundi Jr.   MR#: [id number] DOB: 1971/04/27  DATE OF SERVICE: 02/20/2024   REFERRING PROVIDER  Imelda Man, MD   Patient Care Team: Imelda Man, MD as PCP - General (Internal Medicine) Knox Perl, MD as PCP - Cardiology (Cardiology)   REASON FOR CONSULTATION/ CHIEF COMPLAINT:  Evaluation for possible hypercoagulable state, given history of multiple CVAs  ASSESSMENT & PLAN:  Devin Robert Burundi Jr. is a 53 y.o. gentleman with a past medical history of hypertension, dyslipidemia, CAD s/p PCI x 2, CVAs was referred to our service for evaluation of possible hypercoagulable state given his history of multiple CVAs.    History of multiple cerebrovascular accidents (CVAs) Strokes occurred in February and March 2025 with no residual deficits. Extensive workup, including echocardiogram and TEE, showed no cardiac defects. Current antiplatelet therapy with aspirin  and Brilinta  is appropriate to prevent further strokes. Additional blood tests for hypercoagulable states are pending. He is concerned about the risk of future strokes and is actively seeking preventive measures.  On 12/11/2023, lupus anticoagulant, beta-2  glycoprotein antibodies, anticardiolipin antibody testing was negative.  Homocystine was elevated at 31.5 mol/L.  - Order hypercoagulable workup including prothrombin gene, factor V Leiden, protein C and S activity  - Arrange follow-up phone call in two weeks to discuss test results.   CAD (coronary artery disease), native coronary artery Coronary artery disease with stents placed in 2020 and 2024 due to blockages. Currently on aspirin  and Brilinta  to prevent arterial clots, which are platelet-rich. Full anticoagulation therapy like Eliquis or Xarelto is unnecessary unless a venous clot is documented. Current medications are sufficient to prevent further arterial events. -  Continue aspirin  and Brilinta  - Continue follow-up with cardiologist  Elevated lipoprotein(a) Consistently elevated lipoprotein(a) levels over the past four to five years, increasing thrombotic risk. Current treatment options are limited, with new medications in trial phases. He is aware of potential future availability of these treatments.  Elevated homocysteine levels Elevated homocysteine levels with a recent level of 22, down from 31. He is on a supplement called homocysteine supreme, but the necessity and effect of this supplement are unclear. He was advised to discuss with his PCP.   I reviewed lab results and outside records for this visit and discussed relevant results with the patient. Diagnosis, plan of care and treatment options were also discussed in detail with the patient. Opportunity provided to ask questions and answers provided to his apparent satisfaction. Provided instructions to call our clinic with any problems, questions or concerns prior to return visit. I recommended to continue follow-up with PCP and sub-specialists. He verbalized understanding and agreed with the plan. No barriers to learning was detected.  Arlo Berber, MD  02/20/2024 4:25 PM  [facility] - A DEPT [facility] [address] [place] [address] Dept: [phone] Dept Fax: [phone]   HISTORY OF PRESENT ILLNESS:  Discussed the use of AI scribe software for clinical note transcription with the patient, who gave verbal consent to proceed.  History of Present Illness Devin Robert Burundi Jr. is a 53 year old male with a history of strokes and coronary artery disease who presents for hematology evaluation of unexplained clotting issues. He was referred by Dr. Schuyler Custard for evaluation of his history of strokes and clotting issues.  He has a past medical history of hypercholesteremia, hypertension, elevated LP(a),  anterior wall STEMI on  12/21/2018 status post angioplasty and stenting of mid LAD, presenting with acute inferior STEMI on 09/15/2023 and underwent stenting to distal RCA.     He presented to the emergency room on 12/09/2023 with left insular cortex and left frontal operculum embolic infarct, TEE was negative for any cardiac source of cerebral emboli, no intracardiac shunting and TCD bubble study was also negative for intracardiac shunting.  Underwent loop recorder implantation 12/11/2023 to evaluate for cardiac source of cerebral emboli and discharged home.  He was also enrolled in [place] clinical trial with Asundexian (Factor XI a inhibitor anticoagulant 50 mg versus placebo for secondary stroke prevention.  He has experienced strokes beginning in February 2025, with the first affecting the left side and a second in March 2025 affecting the right side. He was admitted to [facility] for these events and evaluated by neurology, but the cause remains unclear. Basic labs in February were negative for clotting disorders, except for elevated homocysteine levels.  He has a history of coronary artery disease, with a myocardial infarction in 2020 during a [employer] event, leading to the placement of two stents in the lower descending artery. Another cardiac event in November 2024 resulted in a stent in the right artery feeding into the lungs. His medications have remained unchanged since these events, and he is currently taking aspirin  and Brilinta . He takes a supplement called Homocysteine Supreme, two capsules with breakfast, which he started recently after elevated homocysteine levels were noted. Extensive testing, including echocardiograms and a TEE, did not reveal any heart defects. Recent blood work showed high red blood cell counts, but he does not smoke and drinks alcohol minimally. His hemoglobin and hematocrit levels are currently normal.  He has experienced persistent pain in the calf since noticing a bruise after one of the  strokes, but ultrasounds in February and March did not show any clots. He has not been set up to see a vascular surgeon yet.  He is intentionally losing weight to reduce belly fat and is currently exercising and eating smaller meals. He works in [employer] and has not experienced any residual deficits from the strokes.   MEDICAL HISTORY Past Medical History:  Diagnosis Date   Coronary artery disease    Family history of heart disease    HTN (hypertension)    Hyperlipidemia    Palpitations    STEMI (ST elevation myocardial infarction) (HCC)    PCI/DES to p/mLAD, 60% lcx, 40% ostial LM, and 60% PDA, EF 45%     SURGICAL HISTORY Past Surgical History:  Procedure Laterality Date   CORONARY/GRAFT ACUTE MI REVASCULARIZATION N/A 12/21/2018   Procedure: Coronary/Graft Acute MI Revascularization;  Surgeon: Arty Binning, MD;  Location: [facility] INVASIVE CV LAB;  Service: Cardiovascular;  Laterality: N/A;   CORONARY/GRAFT ACUTE MI REVASCULARIZATION N/A 09/14/2023   Procedure: Coronary/Graft Acute MI Revascularization;  Surgeon: Knox Perl, MD;  Location: [facility] INVASIVE CV LAB;  Service: Cardiovascular;  Laterality: N/A;   LEFT HEART CATH AND CORONARY ANGIOGRAPHY N/A 12/21/2018   Procedure: LEFT HEART CATH AND CORONARY ANGIOGRAPHY;  Surgeon: Arty Binning, MD;  Location: [facility] INVASIVE CV LAB;  Service: Cardiovascular;  Laterality: N/A;   LOOP RECORDER INSERTION N/A 12/11/2023   Procedure: LOOP RECORDER INSERTION;  Surgeon: Efraim Grange, MD;  Location: [facility] INVASIVE CV LAB;  Service: Cardiovascular;  Laterality: N/A;   TRANSESOPHAGEAL ECHOCARDIOGRAM (CATH LAB) N/A 12/11/2023   Procedure: TRANSESOPHAGEAL ECHOCARDIOGRAM;  Surgeon: Hugh Madura, MD;  Location: [facility] INVASIVE CV LAB;  Service: Cardiovascular;  Laterality: N/A;     SOCIAL HISTORY: He reports that he has never smoked. He has never used smokeless tobacco. He reports that he does not use drugs. No history on file for  alcohol use. Social History   Socioeconomic History   Marital status: Married    Spouse name: Not on file   Number of children: Not on file   Years of education: Not on file   Highest education level: Not on file  Occupational History   Not on file  Tobacco Use   Smoking status: Never   Smokeless tobacco: Never  Substance and Sexual Activity   Alcohol use: Not on file   Drug use: Never   Sexual activity: Not on file  Other Topics Concern   Not on file  Social History Narrative   Not on file   Social Drivers of Health   Financial Resource Strain: Not on file  Food Insecurity: No Food Insecurity (01/16/2024)   Hunger Vital Sign    Worried About Running Out of Food in the Last Year: Never true    Ran Out of Food in the Last Year: Never true  Transportation Needs: No Transportation Needs (01/16/2024)   PRAPARE - Administrator, Civil Service (Medical): No    Lack of Transportation (Non-Medical): No  Physical Activity: Not on file  Stress: Not on file  Social Connections: Not on file  Intimate Partner Violence: Not At Risk (01/16/2024)   Humiliation, Afraid, Rape, and Kick questionnaire    Fear of Current or Ex-Partner: No    Emotionally Abused: No    Physically Abused: No    Sexually Abused: No    FAMILY HISTORY: His family history includes Aortic aneurysm (age of onset: 67) in his father; COPD in his maternal grandfather; Diabetes in his paternal grandmother; Heart disease in his mother; Heart disease (age of onset: 60) in his father; Heart failure in his maternal grandfather; Hypertension in his father; Skin cancer in his maternal grandmother.  CURRENT MEDICATIONS   Current Outpatient Medications  Medication Instructions   Aspirin  Low Dose 81 mg, Oral, Daily   atorvastatin  (LIPITOR ) 40 mg, Oral, Daily   carvedilol  (COREG ) 6.25 mg, Oral, 2 times daily with meals   losartan  (COZAAR ) 50 mg, Oral, Daily   nitroGLYCERIN  (NITROSTAT ) 0.4 mg, Sublingual, Every 5  min PRN   spironolactone  (ALDACTONE ) 12.5 mg, Oral, Daily   ticagrelor  (BRILINTA ) 90 mg, Oral, 2 times daily     ALLERGIES  He has no known allergies.  REVIEW OF SYSTEMS:  Review of Systems - Oncology   Rest of the pertinent review of systems is unremarkable except as mentioned above in HPI.  PHYSICAL EXAMINATION:   Onc Performance Status - 02/20/24 1602       ECOG Perf Status   ECOG Perf Status Fully active, able to carry on all pre-disease performance without restriction      KPS SCALE   KPS % SCORE Normal, no compliants, no evidence of disease             Vitals:   02/20/24 1551  BP: 115/75  Pulse: 68  Resp: 17  Temp: 98.1 F (36.7 C)  SpO2: 97%   Filed Weights   02/20/24 1551  Weight: 258 lb 9.6 oz (117.3 kg)    Physical Exam Constitutional:      General: He is not in acute distress.    Appearance: Normal appearance.  HENT:  Head: Normocephalic and atraumatic.  Eyes:     General: No scleral icterus.    Conjunctiva/sclera: Conjunctivae normal.  Cardiovascular:     Rate and Rhythm: Normal rate and regular rhythm.     Heart sounds: Normal heart sounds.  Pulmonary:     Effort: Pulmonary effort is normal.     Breath sounds: Normal breath sounds.  Abdominal:     General: There is no distension.  Musculoskeletal:     Right lower leg: No edema.     Left lower leg: No edema.  Neurological:     General: No focal deficit present.     Mental Status: He is alert and oriented to person, place, and time.  Psychiatric:        Mood and Affect: Mood normal.        Behavior: Behavior normal.        Thought Content: Thought content normal.      LABORATORY DATA:   I have reviewed the data as listed.  Results for orders placed or performed in visit on 02/20/24  D-dimer, quantitative  Result Value Ref Range   D-Dimer, Quant <0.27 0.00 - 0.50 ug/mL-FEU  CMP (Cancer Center only)  Result Value Ref Range   Sodium 140 135 - 145 mmol/L   Potassium 4.4  3.5 - 5.1 mmol/L   Chloride 106 98 - 111 mmol/L   CO2 30 22 - 32 mmol/L   Glucose, Bld 80 70 - 99 mg/dL   BUN 12 6 - 20 mg/dL   Creatinine 7.84 6.96 - 1.24 mg/dL   Calcium  9.1 8.9 - 10.3 mg/dL   Total Protein 6.5 6.5 - 8.1 g/dL   Albumin 4.5 3.5 - 5.0 g/dL   AST 27 15 - 41 U/L   ALT 40 0 - 44 U/L   Alkaline Phosphatase 84 38 - 126 U/L   Total Bilirubin 0.8 0.0 - 1.2 mg/dL   GFR, Estimated >29 >52 mL/min   Anion gap 4 (L) 5 - 15  CBC with Differential (Cancer Center Only)  Result Value Ref Range   WBC Count 5.7 4.0 - 10.5 K/uL   RBC 5.35 4.22 - 5.81 MIL/uL   Hemoglobin 15.4 13.0 - 17.0 g/dL   HCT 84.1 32.4 - 40.1 %   MCV 82.2 80.0 - 100.0 fL   MCH 28.8 26.0 - 34.0 pg   MCHC 35.0 30.0 - 36.0 g/dL   RDW 02.7 25.3 - 66.4 %   Platelet Count 211 150 - 400 K/uL   nRBC 0.0 0.0 - 0.2 %   Neutrophils Relative % 75 %   Neutro Abs 4.3 1.7 - 7.7 K/uL   Lymphocytes Relative 14 %   Lymphs Abs 0.8 0.7 - 4.0 K/uL   Monocytes Relative 8 %   Monocytes Absolute 0.5 0.1 - 1.0 K/uL   Eosinophils Relative 2 %   Eosinophils Absolute 0.1 0.0 - 0.5 K/uL   Basophils Relative 1 %   Basophils Absolute 0.0 0.0 - 0.1 K/uL   Immature Granulocytes 0 %   Abs Immature Granulocytes 0.02 0.00 - 0.07 K/uL     RADIOGRAPHIC STUDIES:  No pertinent imaging studies available to review.   Future Appointments  Date Time Provider Department Center  03/06/2024  8:30 AM Lynita Groseclose, Gale Jude, MD [facility] None  03/26/2024  7:10 AM [facility] DEVICE REMOTES [facility]  04/30/2024  7:05 AM [facility] DEVICE REMOTES [facility]  06/04/2024  7:00 AM [facility] DEVICE REMOTES [facility]  07/09/2024  7:05 AM [facility] DEVICE REMOTES [facility]  08/13/2024  7:00 AM [facility] DEVICE REMOTES [facility]  09/17/2024  7:00 AM [facility] DEVICE REMOTES [facility]  11/11/2024  3:30 PM Lisabeth Rider, MD [facility] None    I spent a total of 55 minutes during this encounter with the patient including review of chart and various tests  results, discussions about plan of care and coordination of care plan.  This document was completed utilizing speech recognition software. Grammatical errors, random word insertions, pronoun errors, and incomplete sentences are an occasional consequence of this system due to software limitations, ambient noise, and hardware issues. Any formal questions or concerns about the content, text or information contained within the body of this dictation should be directly addressed to the provider for clarification.

## 2024-02-21 LAB — CUP PACEART REMOTE DEVICE CHECK
Date Time Interrogation Session: 20250430110012
Implantable Pulse Generator Implant Date: 20250218

## 2024-02-21 LAB — HOMOCYSTEINE: Homocysteine: 22.7 umol/L — ABNORMAL HIGH (ref 0.0–14.5)

## 2024-02-22 ENCOUNTER — Encounter: Payer: Self-pay | Admitting: Oncology

## 2024-02-22 DIAGNOSIS — Z8673 Personal history of transient ischemic attack (TIA), and cerebral infarction without residual deficits: Secondary | ICD-10-CM | POA: Insufficient documentation

## 2024-02-22 LAB — PROTEIN C ACTIVITY: Protein C Activity: 79 % (ref 73–180)

## 2024-02-22 LAB — PROTEIN S PANEL
Protein S Activity: 90 % (ref 63–140)
Protein S Ag, Free: 120 % (ref 61–136)
Protein S Ag, Total: 84 % (ref 60–150)

## 2024-02-22 LAB — ANTITHROMBIN PANEL
AT III AG PPP IMM-ACNC: 74 % (ref 72–124)
Antithrombin Activity: 109 % (ref 75–135)

## 2024-02-22 NOTE — Assessment & Plan Note (Signed)
 Strokes occurred in February and March 2025 with no residual deficits. Extensive workup, including echocardiogram and TEE, showed no cardiac defects. Current antiplatelet therapy with aspirin  and Brilinta  is appropriate to prevent further strokes. Additional blood tests for hypercoagulable states are pending. He is concerned about the risk of future strokes and is actively seeking preventive measures.  On 12/11/2023, lupus anticoagulant, beta-2  glycoprotein antibodies, anticardiolipin antibody testing was negative.  Homocystine was elevated at 31.5 mol/L.  - Order hypercoagulable workup including prothrombin gene, factor V Leiden, protein C and S activity  - Arrange follow-up phone call in two weeks to discuss test results.

## 2024-02-22 NOTE — Assessment & Plan Note (Signed)
 Coronary artery disease with stents placed in 2020 and 2024 due to blockages. Currently on aspirin  and Brilinta  to prevent arterial clots, which are platelet-rich. Full anticoagulation therapy like Eliquis or Xarelto is unnecessary unless a venous clot is documented. Current medications are sufficient to prevent further arterial events. - Continue aspirin  and Brilinta  - Continue follow-up with cardiologist

## 2024-02-25 LAB — FACTOR 5 LEIDEN

## 2024-02-26 DIAGNOSIS — I509 Heart failure, unspecified: Secondary | ICD-10-CM | POA: Diagnosis not present

## 2024-02-26 DIAGNOSIS — R748 Abnormal levels of other serum enzymes: Secondary | ICD-10-CM | POA: Diagnosis not present

## 2024-02-26 DIAGNOSIS — Z13220 Encounter for screening for lipoid disorders: Secondary | ICD-10-CM | POA: Diagnosis not present

## 2024-02-26 DIAGNOSIS — R7401 Elevation of levels of liver transaminase levels: Secondary | ICD-10-CM | POA: Diagnosis not present

## 2024-02-26 DIAGNOSIS — D696 Thrombocytopenia, unspecified: Secondary | ICD-10-CM | POA: Diagnosis not present

## 2024-02-26 DIAGNOSIS — M3119 Other thrombotic microangiopathy: Secondary | ICD-10-CM | POA: Diagnosis not present

## 2024-02-27 LAB — PROTHROMBIN GENE MUTATION

## 2024-02-28 NOTE — Addendum Note (Signed)
 Addended by: Lott Rouleau A on: 02/28/2024 01:19 PM   Modules accepted: Orders

## 2024-02-28 NOTE — Progress Notes (Signed)
 [facility] Summary Report / Loop Recorder

## 2024-03-03 ENCOUNTER — Encounter: Payer: Self-pay | Admitting: Cardiovascular Disease

## 2024-03-06 ENCOUNTER — Inpatient Hospital Stay: Admitting: Oncology

## 2024-03-06 ENCOUNTER — Encounter: Payer: Self-pay | Admitting: Oncology

## 2024-03-06 DIAGNOSIS — Z8673 Personal history of transient ischemic attack (TIA), and cerebral infarction without residual deficits: Secondary | ICD-10-CM

## 2024-03-06 NOTE — Progress Notes (Signed)
 Patient could not complete phone call visit as he was busy at work.  I tried calling him at a later time.  There was no answer.  Will try to rearrange it at a later date.

## 2024-03-06 NOTE — Progress Notes (Deleted)
 [facility]  HEMATOLOGY-ONCOLOGY ELECTRONIC VISIT PROGRESS NOTE  PATIENT NAME: Devin Robert Burundi Jr.   MR#: [id number] DOB: July 26, 1971  DATE OF SERVICE: 03/06/2024  Patient Care Team: Imelda Man, MD as PCP - General (Internal Medicine) Knox Perl, MD as PCP - Cardiology (Cardiology)  I connected with the patient via telephone conference and verified that I am speaking with the correct person using two identifiers. The patient's location is at home and I am providing care from the [facility].  I discussed the limitations, risks, security and privacy concerns of performing an evaluation and management service by e-visits and the availability of in person appointments. I also discussed with the patient that there may be a patient responsible charge related to this service. The patient expressed understanding and agreed to proceed.   ASSESSMENT & PLAN:   Devin Robert Burundi Jr. is a 53 y.o. gentleman with a past medical history of hypertension, dyslipidemia, CAD s/p PCI x 2, CVAs was referred to our service for evaluation of possible hypercoagulable state given his history of multiple CVAs.      No problem-specific Assessment & Plan notes found for this encounter.   Assessment and Plan Assessment & Plan       I discussed the assessment and treatment plan with the patient. The patient was provided an opportunity to ask questions and all were answered. The patient agreed with the plan and demonstrated an understanding of the instructions. The patient was advised to call back or seek an in-person evaluation if the symptoms worsen or if the condition fails to improve as anticipated.    I spent *** minutes over the phone with the patient reviewing test results, discuss management and coordination/planning of care.  Arlo Berber, MD 03/06/2024 8:39 AM [facility] - A DEPT OF [facility] [address] [place] [address] Dept: [phone] Dept Fax: [phone]   INTERVAL HISTORY:  Please see above for problem oriented charting.  The purpose of today's discussion is to explain recent lab results and to formulate plan of care.  Discussed the use of AI scribe software for clinical note transcription with the patient, who gave verbal consent to proceed.  History of Present Illness      ***  SUMMARY OF HEMATOLOGY HISTORY:  53 y.o. gentleman with a history of strokes and coronary artery disease who presents for hematology evaluation of unexplained clotting issues. He was referred by Dr. Schuyler Custard for evaluation of his history of strokes and clotting issues.   He has a past medical history of hypercholesteremia, hypertension, elevated LP(a), anterior wall STEMI on 12/21/2018 status post angioplasty and stenting of mid LAD, presenting with acute inferior STEMI on 09/15/2023 and underwent stenting to distal RCA.     He presented to the emergency room on 12/09/2023 with left insular cortex and left frontal operculum embolic infarct, TEE was negative for any cardiac source of cerebral emboli, no intracardiac shunting and TCD bubble study was also negative for intracardiac shunting.  Underwent loop recorder implantation 12/11/2023 to evaluate for cardiac source of cerebral emboli and discharged home.  He was also enrolled in [facility] clinical trial with Asundexian (Factor XI a inhibitor anticoagulant 50 mg versus placebo for secondary stroke prevention.   He has experienced strokes beginning in February 2025, with the first affecting the left side and a second in March 2025 affecting the right side. He was admitted to [facility]  for these events and evaluated by neurology, but the cause remains unclear. Basic labs in February were negative for clotting disorders, except for elevated homocysteine levels.   He has a history of coronary artery disease, with a myocardial infarction in 2020  during a [employer] event, leading to the placement of two stents in the lower descending artery. Another cardiac event in November 2024 resulted in a stent in the right artery feeding into the lungs. His medications have remained unchanged since these events, and he is currently taking aspirin  and Brilinta . He takes a supplement called Homocysteine Supreme, two capsules with breakfast, which he started recently after elevated homocysteine levels were noted. Extensive testing, including echocardiograms and a TEE, did not reveal any heart defects. Recent blood work showed high red blood cell counts, but he does not smoke and drinks alcohol minimally. His hemoglobin and hematocrit levels are currently normal.   He has experienced persistent pain in the calf since noticing a bruise after one of the strokes, but ultrasounds in February and March did not show any clots. He has not been set up to see a vascular surgeon yet.   He is intentionally losing weight to reduce belly fat and is currently exercising and eating smaller meals. He works in [employer] and has not experienced any residual deficits from the strokes.  Current antiplatelet therapy with aspirin  and Brilinta  is appropriate to prevent further strokes. Full anticoagulation therapy like Eliquis or Xarelto is unnecessary unless a venous clot is documented.   He is concerned about the risk of future strokes and is actively seeking preventive measures.   On 12/11/2023, lupus anticoagulant, beta-2  glycoprotein antibodies, anticardiolipin antibody testing was negative.  Homocystine was elevated at 31.5 mol/L.   - Order hypercoagulable workup including prothrombin gene, factor V Leiden, protein C and S activity  Consistently elevated lipoprotein(a) levels over the past four to five years, increasing thrombotic risk. Current treatment options are limited, with new medications in trial phases. He is aware of potential future  availability of these treatments.   Elevated homocysteine levels Elevated homocysteine levels with a recent level of 22, down from 31. He is on a supplement called homocysteine supreme, but the necessity and effect of this supplement are unclear. He was advised to discuss with his PCP.   REVIEW OF SYSTEMS:    Review of Systems - Oncology  All other pertinent systems were reviewed with the patient and are negative.  I have reviewed the past medical history, past surgical history, social history and family history with the patient and they are unchanged from previous note.  ALLERGIES:  He has no known allergies.  MEDICATIONS:  Current Outpatient Medications  Medication Sig Dispense Refill   aspirin  81 MG chewable tablet Chew 1 tablet (81 mg total) by mouth daily. 90 tablet 2   atorvastatin  (LIPITOR ) 40 MG tablet Take 1 tablet (40 mg total) by mouth daily. (Patient taking differently: Take 40 mg by mouth at bedtime.) 90 tablet 3   carvedilol  (COREG ) 6.25 MG tablet Take 1 tablet (6.25 mg total) by mouth 2 (two) times daily with a meal. 180 tablet 3   losartan  (COZAAR ) 50 MG tablet Take 1 tablet (50 mg total) by mouth daily. 90 tablet 3   nitroGLYCERIN  (NITROSTAT ) 0.4 MG SL tablet Place 1 tablet (0.4 mg total) under the tongue every 5 (five) minutes as needed for chest pain. (Patient not taking: Reported on 02/20/2024) 75 tablet 2   spironolactone  (ALDACTONE ) 25 MG  tablet Take 0.5 tablets (12.5 mg total) by mouth daily. 45 tablet 3   ticagrelor  (BRILINTA ) 90 MG TABS tablet Take 1 tablet (90 mg total) by mouth 2 (two) times daily. 180 tablet 3   No current facility-administered medications for this visit.    PHYSICAL EXAMINATION:  ***   LABORATORY DATA:   I have reviewed the data as listed.  Recent Results (from the past 2160 hours)  Protime-INR     Status: None   Collection Time: 12/09/23  7:32 PM  Result Value Ref Range   Prothrombin Time 13.9 11.4 - 15.2 seconds   INR 1.1 0.8 -  1.2    Comment: (NOTE) INR goal varies based on device and disease states. Performed at [facility] Lab, [address] [place], [place] [address]   APTT     Status: None   Collection Time: 12/09/23  7:32 PM  Result Value Ref Range   aPTT 27 24 - 36 seconds    Comment: Performed at [facility] Lab, [address] [place], [place] [address]  CBC     Status: None   Collection Time: 12/09/23  7:32 PM  Result Value Ref Range   WBC 7.4 4.0 - 10.5 K/uL   RBC 5.49 4.22 - 5.81 MIL/uL   Hemoglobin 15.9 13.0 - 17.0 g/dL   HCT 82.9 56.2 - 13.0 %   MCV 84.2 80.0 - 100.0 fL   MCH 29.0 26.0 - 34.0 pg   MCHC 34.4 30.0 - 36.0 g/dL   RDW 86.5 78.4 - 69.6 %   Platelets 268 150 - 400 K/uL   nRBC 0.0 0.0 - 0.2 %    Comment: Performed at [facility] Lab, [address] [place], [place] [address]  Differential     Status: None   Collection Time: 12/09/23  7:32 PM  Result Value Ref Range   Neutrophils Relative % 71 %   Neutro Abs 5.3 1.7 - 7.7 K/uL   Lymphocytes Relative 17 %   Lymphs Abs 1.3 0.7 - 4.0 K/uL   Monocytes Relative 7 %   Monocytes Absolute 0.5 0.1 - 1.0 K/uL   Eosinophils Relative 3 %   Eosinophils Absolute 0.2 0.0 - 0.5 K/uL   Basophils Relative 1 %   Basophils Absolute 0.1 0.0 - 0.1 K/uL   Immature Granulocytes 1 %   Abs Immature Granulocytes 0.04 0.00 - 0.07 K/uL    Comment: Performed at [facility] Lab, [address] [place], [place] [address]  Comprehensive metabolic panel     Status: Abnormal   Collection Time: 12/09/23  7:32 PM  Result Value Ref Range   Sodium 138 135 - 145 mmol/L   Potassium 4.0 3.5 - 5.1 mmol/L   Chloride 109 98 - 111 mmol/L   CO2 24 22 - 32 mmol/L   Glucose, Bld 100 (H) 70 - 99 mg/dL    Comment: Glucose reference range applies only to samples taken after fasting for at least 8 hours.   BUN 14 6 - 20 mg/dL   Creatinine, Ser 4.01 (H) 0.61 - 1.24 mg/dL   Calcium  8.4 (L) 8.9 - 10.3 mg/dL   Total Protein 5.9 (L) 6.5 - 8.1 g/dL    Albumin 3.6 3.5 - 5.0 g/dL   AST 35 15 - 41 U/L   ALT 57 (H) 0 - 44 U/L   Alkaline Phosphatase 68 38 - 126 U/L   Total Bilirubin 0.6 0.0 - 1.2 mg/dL  GFR, Estimated >60 >60 mL/min    Comment: (NOTE) Calculated using the CKD-EPI Creatinine Equation (2021)    Anion gap 5 5 - 15    Comment: Performed at [facility] Lab, [address], [place], [place] [address]  Ethanol     Status: None   Collection Time: 12/09/23  7:32 PM  Result Value Ref Range   Alcohol, Ethyl (B) <10 <10 mg/dL    Comment: (NOTE) Lowest detectable limit for serum alcohol is 10 mg/dL.  For medical purposes only. Performed at [facility] Lab, [address] [place], [place] [address]   I-stat chem 8, ED     Status: Abnormal   Collection Time: 12/09/23  7:32 PM  Result Value Ref Range   Sodium 141 135 - 145 mmol/L   Potassium 4.0 3.5 - 5.1 mmol/L   Chloride 106 98 - 111 mmol/L   BUN 16 6 - 20 mg/dL   Creatinine, Ser 0.98 (H) 0.61 - 1.24 mg/dL   Glucose, Bld 94 70 - 99 mg/dL    Comment: Glucose reference range applies only to samples taken after fasting for at least 8 hours.   Calcium , Ion 1.05 (L) 1.15 - 1.40 mmol/L   TCO2 24 22 - 32 mmol/L   Hemoglobin 15.3 13.0 - 17.0 g/dL   HCT 11.9 14.7 - 82.9 %  CBG monitoring, ED     Status: None   Collection Time: 12/09/23  7:34 PM  Result Value Ref Range   Glucose-Capillary 99 70 - 99 mg/dL    Comment: Glucose reference range applies only to samples taken after fasting for at least 8 hours.  MRSA Next Gen by PCR, Nasal     Status: None   Collection Time: 12/09/23  9:55 PM   Specimen: Nasal Mucosa; Nasal Swab  Result Value Ref Range   MRSA by PCR Next Gen NOT DETECTED NOT DETECTED    Comment: (NOTE) The GeneXpert MRSA Assay (FDA approved for NASAL specimens only), is one component of a comprehensive MRSA colonization surveillance program. It is not intended to diagnose MRSA infection nor to guide or monitor treatment for MRSA infections. Test  performance is not FDA approved in patients less than 27 years old. Performed at [facility] Lab, [address] [place], [place] [address]   HIV Antibody (routine testing w rflx)     Status: None   Collection Time: 12/09/23 11:06 PM  Result Value Ref Range   HIV Screen 4th Generation wRfx Non Reactive Non Reactive    Comment: Performed at [facility], [address], [place], [place] [address]  Lipid panel     Status: Abnormal   Collection Time: 12/09/23 11:06 PM  Result Value Ref Range   Cholesterol 124 0 - 200 mg/dL   Triglycerides 846 (H) <150 mg/dL   HDL 28 (L) >96 mg/dL   Total CHOL/HDL Ratio 4.4 RATIO   VLDL 32 0 - 40 mg/dL   LDL Cholesterol 64 0 - 99 mg/dL    Comment:        Total Cholesterol/HDL:CHD Risk Coronary Heart Disease Risk Table                     Men   Women  1/2 Average Risk   3.4   3.3  Average Risk       5.0   4.4  2 X Average Risk   9.6   7.1  3 X Average Risk  23.4   11.0        Use the calculated Patient Ratio above and the CHD Risk Table to determine the patient's CHD Risk.        ATP III CLASSIFICATION (LDL):  <100     mg/dL   Optimal  782-956  mg/dL   Near or Above                    Optimal  130-159  mg/dL   Borderline  213-086  mg/dL   High  >578     mg/dL   Very High Performed at [facility] Lab, [address] [place], [place] [address]   CBC     Status: Abnormal   Collection Time: 12/09/23 11:06 PM  Result Value Ref Range   WBC 8.9 4.0 - 10.5 K/uL   RBC 5.87 (H) 4.22 - 5.81 MIL/uL   Hemoglobin 16.7 13.0 - 17.0 g/dL   HCT 95.2 84.1 - 32.4 %   MCV 83.6 80.0 - 100.0 fL   MCH 28.4 26.0 - 34.0 pg   MCHC 34.0 30.0 - 36.0 g/dL   RDW 40.1 02.7 - 25.3 %   Platelets 282 150 - 400 K/uL   nRBC 0.0 0.0 - 0.2 %    Comment: Performed at [facility] Lab, [address] [place], [place] [address]  Creatinine, serum     Status: Abnormal   Collection Time: 12/09/23 11:06 PM  Result Value Ref Range   Creatinine, Ser 1.27 (H)  0.61 - 1.24 mg/dL   GFR, Estimated >34 >74 mL/min    Comment: (NOTE) Calculated using the CKD-EPI Creatinine Equation (2021) Performed at [facility] Lab, [address] [place], [place] [address]   Rapid urine drug screen (hospital performed)     Status: Abnormal   Collection Time: 12/10/23  8:29 AM  Result Value Ref Range   Opiates NONE DETECTED NONE DETECTED   Cocaine NONE DETECTED NONE DETECTED   Benzodiazepines NONE DETECTED NONE DETECTED   Amphetamines NONE DETECTED NONE DETECTED   Tetrahydrocannabinol POSITIVE (A) NONE DETECTED   Barbiturates NONE DETECTED NONE DETECTED    Comment: (NOTE) DRUG SCREEN FOR MEDICAL PURPOSES ONLY.  IF CONFIRMATION IS NEEDED FOR ANY PURPOSE, NOTIFY LAB WITHIN 5 DAYS.  LOWEST DETECTABLE LIMITS FOR URINE DRUG SCREEN Drug Class                     Cutoff (ng/mL) Amphetamine and metabolites    1000 Barbiturate and metabolites    200 Benzodiazepine                 200 Opiates and metabolites        300 Cocaine and metabolites        300 THC                            50 Performed at [facility] Lab, [address], [place], [place] [address]   ECHOCARDIOGRAM COMPLETE     Status: None   Collection Time: 12/10/23  9:21 AM  Result Value Ref Range   Weight 4,246.94 oz   Height 74 in   BP 128/80 mmHg   Single Plane A2C EF 58.4 %   Single Plane A4C EF 47.4 %   Calc EF 53.5 %   S' Lateral 4.00 cm   AR max vel 3.12 cm2   AV Area VTI 2.67 cm2  AV Mean grad 3.0 mmHg   AV Peak grad 5.0 mmHg   Ao pk vel 1.12 m/s   Area-P 1/2 2.85 cm2   AV Area mean vel 2.62 cm2   MV VTI 3.31 cm2   Est EF 45 - 50%   Hemoglobin A1c     Status: None   Collection Time: 12/11/23  6:05 AM  Result Value Ref Range   Hgb A1c MFr Bld 4.9 4.8 - 5.6 %    Comment: (NOTE) Pre diabetes:          5.7%-6.4%  Diabetes:              >6.4%  Glycemic control for   <7.0% adults with diabetes    Mean Plasma Glucose 93.93 mg/dL    Comment: Performed at [facility] Lab, [address], [place], [place] [address]  Basic metabolic panel     Status: Abnormal   Collection Time: 12/11/23  6:05 AM  Result Value Ref Range   Sodium 140 135 - 145 mmol/L   Potassium 4.1 3.5 - 5.1 mmol/L   Chloride 105 98 - 111 mmol/L   CO2 24 22 - 32 mmol/L   Glucose, Bld 93 70 - 99 mg/dL    Comment: Glucose reference range applies only to samples taken after fasting for at least 8 hours.   BUN 12 6 - 20 mg/dL   Creatinine, Ser 9.14 0.61 - 1.24 mg/dL   Calcium  8.6 (L) 8.9 - 10.3 mg/dL   GFR, Estimated >78 >29 mL/min    Comment: (NOTE) Calculated using the CKD-EPI Creatinine Equation (2021)    Anion gap 11 5 - 15    Comment: Performed at [facility] Lab, [address] [place], [place] [address]  CBC     Status: None   Collection Time: 12/11/23  6:05 AM  Result Value Ref Range   WBC 6.0 4.0 - 10.5 K/uL   RBC 5.42 4.22 - 5.81 MIL/uL   Hemoglobin 15.7 13.0 - 17.0 g/dL   HCT 08.6 57.8 - 46.9 %   MCV 83.4 80.0 - 100.0 fL   MCH 29.0 26.0 - 34.0 pg   MCHC 34.7 30.0 - 36.0 g/dL   RDW 62.9 52.8 - 41.3 %   Platelets 274 150 - 400 K/uL   nRBC 0.0 0.0 - 0.2 %    Comment: Performed at [facility] Lab, [address] [place], [place] [address]  ECHO TEE     Status: None   Collection Time: 12/11/23  8:55 AM  Result Value Ref Range   Radius 0.60 cm   MV M vel 5.60 m/s   MV Peak grad 125.4 mmHg   Est EF 55 - 60%   Lupus anticoagulant panel     Status: None   Collection Time: 12/11/23 12:12 PM  Result Value Ref Range   PTT Lupus Anticoagulant 31.5 0.0 - 43.5 sec   DRVVT 35.3 0.0 - 47.0 sec   Lupus Anticoag Interp Comment:     Comment: (NOTE) No lupus anticoagulant was detected. Performed At: [facility] [address] [place], [place] [id number] Pearlean Botts MD QI:[id number]   Beta-2 -glycoprotein i abs, IgG/M/A     Status: None   Collection Time: 12/11/23 12:12 PM  Result Value Ref Range   Beta-2  Glyco I IgG <9 0 - 20 GPI IgG units    Comment:  (NOTE) The reference interval reflects a 3SD or 99th percentile interval, which is thought to represent  a potentially clinically significant result in accordance with the International Consensus Statement on the classification criteria for definitive antiphospholipid syndrome (APS). J Thromb Haem 2006;4:295-306.    Beta-2 -Glycoprotein I IgM <9 0 - 32 GPI IgM units    Comment: (NOTE) The reference interval reflects a 3SD or 99th percentile interval, which is thought to represent a potentially clinically significant result in accordance with the International Consensus Statement on the classification criteria for definitive antiphospholipid syndrome (APS). J Thromb Haem 2006;4:295-306. Performed At: [facility] [address] [place], [place] [id number] Pearlean Botts MD UU:[id number]    Beta-2 -Glycoprotein I IgA <9 0 - 25 GPI IgA units    Comment: (NOTE) The reference interval reflects a 3SD or 99th percentile interval, which is thought to represent a potentially clinically significant result in accordance with the International Consensus Statement on the classification criteria for definitive antiphospholipid syndrome (APS). J Thromb Haem 2006;4:295-306.   Homocysteine, serum     Status: Abnormal   Collection Time: 12/11/23 12:12 PM  Result Value Ref Range   Homocysteine 31.5 (H) 0.0 - 14.5 umol/L    Comment: (NOTE) Performed At: [facility] [address] [place], [place] [id number] Pearlean Botts MD OV:[id number]   Cardiolipin antibodies, IgG, IgM, IgA     Status: None   Collection Time: 12/11/23 12:12 PM  Result Value Ref Range   Anticardiolipin IgG <9 0 - 14 GPL U/mL    Comment: (NOTE)                          Negative:              <15                          Indeterminate:     15 - 20                          Low-Med Positive: >20 - 80                          High Positive:         >80    Anticardiolipin IgM <9 0 - 12 MPL U/mL    Comment:  (NOTE)                          Negative:              <13                          Indeterminate:     13 - 20                          Low-Med Positive: >20 - 80                          High Positive:         >80    Anticardiolipin IgA <9 0 - 11 APL U/mL    Comment: (NOTE)                          Negative:              <12  Indeterminate:     12 - 20                          Low-Med Positive: >20 - 80                          High Positive:         >80 Performed At: [facility] [address] [place], [place] [id number] Pearlean Botts MD NW:[id number]   Lab report - scanned     Status: None   Collection Time: 12/21/23 11:16 AM  Result Value Ref Range   EGFR (Non-African Amer.) 75     Comment: ABSTRACTED BY HIM  TSH     Status: None   Collection Time: 01/15/24  4:41 PM  Result Value Ref Range   TSH 1.330 0.450 - 4.500 uIU/mL  Lipoprotein A (LPA)     Status: Abnormal   Collection Time: 01/15/24  4:41 PM  Result Value Ref Range   Lipoprotein (a) 234.7 (H) <75.0 nmol/L    Comment: Note:  Values greater than or equal to 75.0 nmol/L may        indicate an independent risk factor for CHD,        but must be evaluated with caution when applied        to non-Caucasian populations due to the        influence of genetic factors on Lp(a) across        ethnicities.   CBG monitoring, ED     Status: None   Collection Time: 01/16/24 12:08 AM  Result Value Ref Range   Glucose-Capillary 96 70 - 99 mg/dL    Comment: Glucose reference range applies only to samples taken after fasting for at least 8 hours.  Ethanol     Status: None   Collection Time: 01/16/24 12:10 AM  Result Value Ref Range   Alcohol, Ethyl (B) <10 <10 mg/dL    Comment: (NOTE) Lowest detectable limit for serum alcohol is 10 mg/dL.  For medical purposes only. Performed at [facility] Lab, [address] [place], [place] [address]   Protime-INR     Status: None   Collection Time:  01/16/24 12:10 AM  Result Value Ref Range   Prothrombin Time 13.8 11.4 - 15.2 seconds   INR 1.0 0.8 - 1.2    Comment: (NOTE) INR goal varies based on device and disease states. Performed at [facility], [address] [place], [place] [address]   APTT     Status: Abnormal   Collection Time: 01/16/24 12:10 AM  Result Value Ref Range   aPTT 37 (H) 24 - 36 seconds    Comment:        IF BASELINE aPTT IS ELEVATED, SUGGEST PATIENT RISK ASSESSMENT BE USED TO DETERMINE APPROPRIATE ANTICOAGULANT THERAPY. Performed at [facility] Lab, [address], [place], [place] [address]   CBC     Status: None   Collection Time: 01/16/24 12:10 AM  Result Value Ref Range   WBC 8.2 4.0 - 10.5 K/uL   RBC 5.62 4.22 - 5.81 MIL/uL   Hemoglobin 16.2 13.0 - 17.0 g/dL   HCT 24.4 01.0 - 27.2 %   MCV 83.8 80.0 - 100.0 fL   MCH 28.8 26.0 - 34.0 pg   MCHC 34.4 30.0 - 36.0 g/dL   RDW 53.6 64.4 - 03.4 %   Platelets 193  150 - 400 K/uL   nRBC 0.0 0.0 - 0.2 %    Comment: Performed at [facility] Lab, [address], [place], [place] [address]  Differential     Status: None   Collection Time: 01/16/24 12:10 AM  Result Value Ref Range   Neutrophils Relative % 60 %   Neutro Abs 4.9 1.7 - 7.7 K/uL   Lymphocytes Relative 25 %   Lymphs Abs 2.1 0.7 - 4.0 K/uL   Monocytes Relative 11 %   Monocytes Absolute 0.9 0.1 - 1.0 K/uL   Eosinophils Relative 3 %   Eosinophils Absolute 0.2 0.0 - 0.5 K/uL   Basophils Relative 1 %   Basophils Absolute 0.1 0.0 - 0.1 K/uL   Immature Granulocytes 0 %   Abs Immature Granulocytes 0.03 0.00 - 0.07 K/uL    Comment: Performed at [facility] Lab, [address] [place], [place] [address]  Comprehensive metabolic panel     Status: Abnormal   Collection Time: 01/16/24 12:10 AM  Result Value Ref Range   Sodium 139 135 - 145 mmol/L   Potassium 3.5 3.5 - 5.1 mmol/L   Chloride 106 98 - 111 mmol/L   CO2 24 22 - 32 mmol/L   Glucose, Bld 98 70 - 99 mg/dL    Comment:  Glucose reference range applies only to samples taken after fasting for at least 8 hours.   BUN 13 6 - 20 mg/dL   Creatinine, Ser 5.62 0.61 - 1.24 mg/dL   Calcium  8.5 (L) 8.9 - 10.3 mg/dL   Total Protein 5.9 (L) 6.5 - 8.1 g/dL   Albumin 3.8 3.5 - 5.0 g/dL   AST 29 15 - 41 U/L   ALT 46 (H) 0 - 44 U/L   Alkaline Phosphatase 76 38 - 126 U/L   Total Bilirubin 0.7 0.0 - 1.2 mg/dL   GFR, Estimated >13 >08 mL/min    Comment: (NOTE) Calculated using the CKD-EPI Creatinine Equation (2021)    Anion gap 9 5 - 15    Comment: Performed at [facility], [address] [place], [place] [address]  I-stat chem 8, ED     Status: Abnormal   Collection Time: 01/16/24 12:13 AM  Result Value Ref Range   Sodium 141 135 - 145 mmol/L   Potassium 3.5 3.5 - 5.1 mmol/L   Chloride 104 98 - 111 mmol/L   BUN 13 6 - 20 mg/dL   Creatinine, Ser 6.96 0.61 - 1.24 mg/dL   Glucose, Bld 92 70 - 99 mg/dL    Comment: Glucose reference range applies only to samples taken after fasting for at least 8 hours.   Calcium , Ion 1.07 (L) 1.15 - 1.40 mmol/L   TCO2 25 22 - 32 mmol/L   Hemoglobin 16.0 13.0 - 17.0 g/dL   HCT 29.5 28.4 - 13.2 %  MRSA Next Gen by PCR, Nasal     Status: None   Collection Time: 01/16/24  2:24 AM   Specimen: Nasal Mucosa; Nasal Swab  Result Value Ref Range   MRSA by PCR Next Gen NOT DETECTED NOT DETECTED    Comment: (NOTE) The GeneXpert MRSA Assay (FDA approved for NASAL specimens only), is one component of a comprehensive MRSA colonization surveillance program. It is not intended to diagnose MRSA infection nor to guide or monitor treatment for MRSA infections. Test performance is not FDA approved in patients less than 64 years old. Performed at [facility], [address],  [place], [place] [address]   Glucose, capillary     Status: Abnormal   Collection Time: 01/16/24  2:24 AM  Result Value Ref Range   Glucose-Capillary 118 (H) 70 - 99 mg/dL    Comment: Glucose reference range  applies only to samples taken after fasting for at least 8 hours.  Glucose, capillary     Status: Abnormal   Collection Time: 01/16/24  8:04 AM  Result Value Ref Range   Glucose-Capillary 114 (H) 70 - 99 mg/dL    Comment: Glucose reference range applies only to samples taken after fasting for at least 8 hours.  Urinalysis, Routine w reflex microscopic -Urine, Clean Catch     Status: None   Collection Time: 01/16/24 10:06 AM  Result Value Ref Range   Color, Urine YELLOW YELLOW   APPearance CLEAR CLEAR   Specific Gravity, Urine 1.028 1.005 - 1.030   pH 5.0 5.0 - 8.0   Glucose, UA NEGATIVE NEGATIVE mg/dL   Hgb urine dipstick NEGATIVE NEGATIVE   Bilirubin Urine NEGATIVE NEGATIVE   Ketones, ur NEGATIVE NEGATIVE mg/dL   Protein, ur NEGATIVE NEGATIVE mg/dL   Nitrite NEGATIVE NEGATIVE   Leukocytes,Ua NEGATIVE NEGATIVE    Comment: Performed at [facility], [address] [place], [place] [address]  Urine rapid drug screen (hosp performed)     Status: None   Collection Time: 01/16/24 10:06 AM  Result Value Ref Range   Opiates NONE DETECTED NONE DETECTED   Cocaine NONE DETECTED NONE DETECTED   Benzodiazepines NONE DETECTED NONE DETECTED   Amphetamines NONE DETECTED NONE DETECTED   Tetrahydrocannabinol NONE DETECTED NONE DETECTED   Barbiturates NONE DETECTED NONE DETECTED    Comment: (NOTE) DRUG SCREEN FOR MEDICAL PURPOSES ONLY.  IF CONFIRMATION IS NEEDED FOR ANY PURPOSE, NOTIFY LAB WITHIN 5 DAYS.  LOWEST DETECTABLE LIMITS FOR URINE DRUG SCREEN Drug Class                     Cutoff (ng/mL) Amphetamine and metabolites    1000 Barbiturate and metabolites    200 Benzodiazepine                 200 Opiates and metabolites        300 Cocaine and metabolites        300 THC                            50 Performed at [facility], [address], [place], [place] [address]   CBC     Status: Abnormal   Collection Time: 01/16/24 10:27 AM  Result Value Ref Range   WBC 7.1 4.0  - 10.5 K/uL   RBC 5.87 (H) 4.22 - 5.81 MIL/uL   Hemoglobin 16.7 13.0 - 17.0 g/dL   HCT 69.6 29.5 - 28.4 %   MCV 83.1 80.0 - 100.0 fL   MCH 28.4 26.0 - 34.0 pg   MCHC 34.2 30.0 - 36.0 g/dL   RDW 13.2 44.0 - 10.2 %   Platelets 202 150 - 400 K/uL   nRBC 0.0 0.0 - 0.2 %    Comment: Performed at [facility], [address] [place], [place] [address]  Creatinine, serum     Status: None   Collection Time: 01/16/24 10:27 AM  Result Value Ref Range   Creatinine, Ser 1.07 0.61 - 1.24 mg/dL   GFR, Estimated >64 >40 mL/min    Comment: (  NOTE) Calculated using the CKD-EPI Creatinine Equation (2021) Performed at [facility], [address], [place], [place] [address]   CUP PACEART REMOTE DEVICE CHECK     Status: None   Collection Time: 01/16/24 11:00 AM  Result Value Ref Range   Date Time Interrogation Session [id number]    Pulse Generator Manufacturer MERM    Pulse Gen Model LNQ22 LINQ II    Pulse Gen Serial Number [id number]    Clinic Name [facility]    Implantable Pulse Generator Type ICM/ILR    Implantable Pulse Generator Implant Date [id number]    Eval Rhythm SR, noisy baseline   ANA w/Reflex if Positive     Status: None   Collection Time: 01/16/24  2:43 PM  Result Value Ref Range   Anti Nuclear Antibody (ANA) Negative Negative    Comment: (NOTE) Performed At: [facility] [address] [place], [place] [id number] Pearlean Botts MD KG:[id number]   Homocysteine     Status: Abnormal   Collection Time: 02/20/24  8:21 AM  Result Value Ref Range   Homocysteine 22.7 (H) 0.0 - 14.5 umol/L    Comment: (NOTE) Performed At: [facility] [address] [place], [place] [id number] Pearlean Botts MD VZ:[id number]   PROTEIN S PANEL     Status: None   Collection Time: 02/20/24  8:22 AM  Result Value Ref Range   Protein S Ag, Total 84 60 - 150 %    Comment: (NOTE) This test was developed and its performance characteristics determined by [facility]. It  has not been cleared or approved by the Food and Drug Administration.    Protein S Ag, Free 120 61 - 136 %   Protein S Activity 90 63 - 140 %    Comment: (NOTE) Protein S activity may be falsely increased (masking an abnormal, low result) in patients receiving direct Xa inhibitor (e.g., rivaroxaban, apixaban, edoxaban) or a direct thrombin inhibitor (e.g., dabigatran) anticoagulant treatment due to assay interference by these drugs. Performed At: [facility] [address] [place], [place] [id number] Pearlean Botts MD [id number]   Antithrombin panel     Status: None   Collection Time: 02/20/24  8:22 AM  Result Value Ref Range   Antithrombin Activity 109 75 - 135 %    Comment: (NOTE) Direct Xa inhibitor anticoagulants such as rivaroxaban, apixaban and edoxaban will lead to spuriously elevated antithrombin activity levels possibly masking a deficiency.    AT III AG PPP IMM-ACNC 74 72 - 124 %    Comment: (NOTE) This test was developed and its performance characteristics determined by [facility]. It has not been cleared or approved by the Food and Drug Administration. Performed At: [facility] [address] [place], [place] [id number] Pearlean Botts MD UR:[id number]   Protein C activity     Status: None   Collection Time: 02/20/24  8:22 AM  Result Value Ref Range   Protein C Activity 79 73 - 180 %    Comment: (NOTE) A deficiency of protein C (PC), either congenital or acquired, increases the risk of thromboembolism. Acquired PC deficiency occurs more frequently than congenital deficiency. PC levels can be transiently diminished after a thrombotic event or surgery or in the presence of certain anticoagulants. Heparin , direct Xa inhibitor, or thrombotic inhibitor therapy does not alter PC levels physiologically and does not interfere with this assay because it is chromogenic and clot-based. Vitamin K antagonist therapy may decrease plasma levels of  functional protein C (PC) as PC is  a vitamin K- dependent protein. Vitamin K deficiency, due to dietary insufficiency or malabsorption will also lead to reduced PC levels. Acquired deficiency can be found in individuals with disseminated intravascular coagulation (DIC) and sepsis. Severe hepatic disorders (hepatitis, cirrhosis, etc.), nephrotic syndrome, malignancy and inflammatory bowel disease can lead to diminished PC levels. Drug  therapy with L-asparaginse or fluorouracil can also reduce PC levels. Levels may be decreased in patients with polycythemia vera, sickle cell disease and essential thrombocythemia. Repeat evaluation on a new plasma sample to confirm or refute this result should be considered, after ruling out acquired causes, depending on the clinical scenario. Performed At: [facility] [address] [place], [place] [id number] Pearlean Botts MD WU:[id number]   Factor 5 leiden     Status: None   Collection Time: 02/20/24  8:22 AM  Result Value Ref Range   Recommendations-F5LEID: Comment     Comment: (NOTE) Result: c.1601G>A (p.Arg534Gln) - Not Detected This result is not associated with an increased risk for venous thromboembolism. See Additional Clinical Information and Comments. Additional Clinical Information: Venous thromboembolism is a multifactorial disease influenced by genetic, environmental, and circumstantial risk factors. The c.1601G>A (p. Arg534Gln) variant in the F5 gene, commonly referred to as Factor V Leiden, is a genetic risk factor for venous thromboembolism. Heterozygous carriers of this variant have a 6- to 8- fold increased risk for venous thromboembolism. Individuals homozygous for this variant (ie, with a copy of the variant on each chromosome) have an approximately 80-fold increased risk for venous thromboembolism. Individuals who carry both a c.*97G>A variant in the F2 gene and Factor V Leiden have an approximately 20-fold  increased risk for venous thromboembolism. Risks are likely to be even higher in more complex genotype combinations in volving the F2 c.*97G>A variant and Factor V Leiden (PMID: 95621308). Additional risk factors include but are not limited to: deficiency of protein C, protein S, or antithrombin III , age, male sex, personal or family history of deep vein thromboembolism, smoking, surgery, prolonged immobilization, malignant neoplasm, tamoxifen treatment, raloxifene treatment, oral contraceptive use, hormone replacement therapy, and pregnancy. Management of thrombotic risk and thrombotic events should follow established guidelines and fit the clinical circumstance. This result cannot predict the occurrence or recurrence of a thrombotic event. Comment: Genetic counseling is recommended to discuss the potential clinical implications of positive results, as well as recommendations for testing family members. Genetic Coordinators are available for health care providers to discuss results at [phone]). Test Details: Variant Analyzed: c.1601G>A (p. Arg534Gln), referred to as Fact or V Leiden Methods/Limitations: DNA analysis of the F5 gene (NM_000130.5) was performed by PCR amplification followed by restriction enzyme analysis. The diagnostic sensitivity is >99%. Results must be combined with clinical information for the most accurate interpretation. Molecular- based testing is highly accurate, but as in any laboratory test, diagnostic errors may occur. False positive or false negative results may occur for reasons that include genetic variants, blood transfusions, bone marrow transplantation, somatic or tissue-specific mosaicism, mislabeled samples, or erroneous representation of family relationships. This test was developed and its performance characteristics determined by [facility]. It has not been cleared or approved by the Food and Drug Administration. References: Bhatt  S, Taylor AK, Lozano R, Grody [facility], Manfred Seed [facility]; ACMG Professional Practice and Guidelines Committee. Addendum: [employer] of Medical Genetics consensus statement on fac tor V Leiden mutation testing. Genet Med. 2021 Mar 5. doi: 46.9629/B28413-244- 01108-x. PMID: 01027253. Nanetta Baars. Factor V Leiden Thrombophilia. 1999 May 14 (Updated 2018 Jan 4). In: Jerri Morale,  Ardinger HH, Pagon RA, et al., editors. GeneReviews(R) (Internet). [address] [facility]): [employer] , [place]; 6962-9528. Available from: [email or web address] Lynda Sands, Huang X, Luo B, Spector EB, Nicolasa Barrett CS; ACMG Laboratory Quality Assurance Committee. Venous thromboembolism laboratory testing (factor V Leiden and factor II c.*97G>A), 2018 update: a technical standard of the [employer] of [employer] and Genomics (ACMG). Genet Med. 2018 Dec;20(12):1489-1498. doi: 10.1038/[id number]-z. Epub 2018 Oct 5. PMID: 41324401.    Reviewed By: Comment     Comment: (NOTE) Technical Component performed at [employer] [facility] Professional Component performed by: Gwyneth Lesch, PhD, [facility] [id number], [facility], [address] [place] [address] Performed At: [facility] [address] [place], [place] [id number] Adams Adams MDPhD QQ:[id number]   Prothrombin gene mutation     Status: None   Collection Time: 02/20/24  8:22 AM  Result Value Ref Range   Recommendations-PTGENE: Comment     Comment: (NOTE) Result: c.*97G>A - Not Detected This result is not associated with an increased risk for venous thromboembolism. See Additional Clinical Information and Comments. Additional Clinical Information: Venous thromboembolism is a multifactorial disease influenced by genetic, environmental, and circumstantial risk factors. The c.*97G>A variant in the F2 gene is a genetic risk factor for venous thromboembolism. Heterozygous carriers have a 2- to 4-fold increased risk for venous  thromboembolism. Homozygotes for the c.*97G>A variant are rare. The annual risk of VTE in homozygotes has been reported to be 1.1%/year. Individuals who carry both a c.*97G>A variant in the F2 gene and a c.1601G>A (p. Arg534Gln) variant in the F5 gene (commonly referred to as Factor V Leiden) have an approximately 20- fold increased risk for venous thromboembolism. Risks are likely to be even higher in more complex genotype combinations involving the F2 c.*97G>A variant and Factor V Leiden (PMID:  33295188). Additional risk factors include but are not limited to: deficiency of protein C, protein S, or antithrombin III , age, male sex, personal or family history of deep vein thromboembolism, smoking, surgery, prolonged immobilization, malignant neoplasm, tamoxifen treatment, raloxifene treatment, oral contraceptive use, hormone replacement therapy, and pregnancy. Management of thrombotic risk and thrombotic events should follow established guidelines and fit the clinical circumstance. This result cannot predict the occurrence or recurrence of a thrombotic event. Comments: Genetic counseling is recommended to discuss the potential clinical implications of positive results, as well as recommendations for testing family members. Genetic Coordinators are available for health care providers to discuss results at [phone]). Test Details: Variant analyzed: c.*97G>A, previously referred to as G20210A Methods/Limitations: DNA analysis of the F2 gene (NM_000 506.5) was performed by PCR amplification followed by restriction enzyme analysis. The diagnostic sensitivity is >99%. Results must be combined with clinical information for the most accurate interpretation. Molecular-based testing is highly accurate, but as in any laboratory test, diagnostic errors may occur. False positive or false negative results may occur for reasons that include genetic variants, blood transfusions,  bone marrow transplantation, somatic or tissue-specific mosaicism, mislabeled samples, or erroneous representation of family relationships. This test was developed and its performance characteristics determined by [facility]. It has not been cleared or approved by the Food and Drug Administration. References: Bhatt S, Taylor AK, Lozano R, Grody [facility], Manfred Seed [facility]; ACMG Professional Practice and Guidelines Committee. Addendum: [employer] of [employer] Genetics consensus statement on factor V Leiden mutation testing. Genet Med. 2021 Mar 5. doi: 06.3016/W109 [phone]-x. PMID: 32355732. Nanetta Baars. Prothrombin Thrombophilia. 2006 Jul 25 [Updated 2021 Feb 4]. In: Adam MP, Ardinger HH, Pagon RA, et  al., Visual merchandiser. GeneReviews(R) [Internet]. [address] [facility]): [employer] , [place]; 2595-6387. Available from: [email or web address].com/ Lynda Sands, Huang X, Luo B, Spector EB, Nicolasa Barrett CS; ACMG Laboratory Quality Assurance Committee. Venous thromboembolism laboratory testing (factor V Leiden and factor II c.*97G>A), 2018 update: a technical standard of the [employer] of [employer] and Genomics (ACMG). Genet Med. 2018 Dec;20(12):1489-1498. doi: 10.1038/[id number]-z. Epub 2018 Oct 5. PMID: 56433295.    Reviewed by: Comment     Comment: (NOTE) Technical Component performed at [facility] Professional Component performed by: Horald Lyme, PhD, [facility] [id number], [facility], [address] [place] [address] Performed At: [facility] [address] [place], [place] [id number] Adams Adams MDPhD FU:[id number]   D-dimer, quantitative     Status: None   Collection Time: 02/20/24  8:22 AM  Result Value Ref Range   D-Dimer, Quant <0.27 0.00 - 0.50 ug/mL-FEU    Comment: (NOTE) At the manufacturer cut-off value of 0.5 g/mL FEU, this assay has a negative predictive value of 95-100%.This assay is intended for use in conjunction with a clinical  pretest probability (PTP) assessment model to exclude pulmonary embolism (PE) and deep venous thrombosis (DVT) in outpatients suspected of PE or DVT. Results should be correlated with clinical presentation. Performed at [facility], [address], [place], [place] [address]   CMP (Cancer Center only)     Status: Abnormal   Collection Time: 02/20/24  8:22 AM  Result Value Ref Range   Sodium 140 135 - 145 mmol/L   Potassium 4.4 3.5 - 5.1 mmol/L   Chloride 106 98 - 111 mmol/L   CO2 30 22 - 32 mmol/L   Glucose, Bld 80 70 - 99 mg/dL    Comment: Glucose reference range applies only to samples taken after fasting for at least 8 hours.   BUN 12 6 - 20 mg/dL   Creatinine 7.06 2.37 - 1.24 mg/dL   Calcium  9.1 8.9 - 10.3 mg/dL   Total Protein 6.5 6.5 - 8.1 g/dL   Albumin 4.5 3.5 - 5.0 g/dL   AST 27 15 - 41 U/L   ALT 40 0 - 44 U/L   Alkaline Phosphatase 84 38 - 126 U/L   Total Bilirubin 0.8 0.0 - 1.2 mg/dL   GFR, Estimated >62 >83 mL/min    Comment: (NOTE) Calculated using the CKD-EPI Creatinine Equation (2021)    Anion gap 4 (L) 5 - 15    Comment: Performed at [facility], [address] [place], [place] [address]  CBC with Differential (Cancer Center Only)     Status: None   Collection Time: 02/20/24  8:22 AM  Result Value Ref Range   WBC Count 5.7 4.0 - 10.5 K/uL   RBC 5.35 4.22 - 5.81 MIL/uL   Hemoglobin 15.4 13.0 - 17.0 g/dL   HCT 16.0 73.7 - 10.6 %   MCV 82.2 80.0 - 100.0 fL   MCH 28.8 26.0 - 34.0 pg   MCHC 35.0 30.0 - 36.0 g/dL   RDW 26.9 48.5 - 46.2 %   Platelet Count 211 150 - 400 K/uL   nRBC 0.0 0.0 - 0.2 %   Neutrophils Relative % 75 %   Neutro Abs 4.3 1.7 - 7.7 K/uL   Lymphocytes Relative 14 %   Lymphs Abs 0.8 0.7 - 4.0 K/uL   Monocytes Relative 8 %   Monocytes Absolute 0.5 0.1 - 1.0 K/uL   Eosinophils Relative 2 %  Eosinophils Absolute 0.1 0.0 - 0.5 K/uL   Basophils Relative 1 %   Basophils Absolute 0.0 0.0 - 0.1  K/uL   Immature Granulocytes 0 %   Abs Immature Granulocytes 0.02 0.00 - 0.07 K/uL    Comment: Performed at [facility], [address], [place], [place] [address]  CUP PACEART REMOTE DEVICE CHECK     Status: None   Collection Time: 02/20/24 11:00 AM  Result Value Ref Range   Date Time Interrogation Session [id number]    Pulse Generator Manufacturer MERM    Pulse Gen Model LNQ22 LINQ II    Pulse Gen Serial Number [id number]    Clinic Name [facility]    Implantable Pulse Generator Type ICM/ILR    Implantable Pulse Generator Implant Date [id number]      RADIOGRAPHIC STUDIES:  I have personally reviewed the radiological images as listed and agree with the findings in the report.  CUP PACEART REMOTE DEVICE CHECK Result Date: 02/21/2024 ILR summary report received. Battery status OK. Normal device function. No new symptom, tachy, brady, or pause episodes. No new AF episodes. Monthly summary reports and ROV/PRN AB, CVRS   *** No recent pertinent imaging studies available to review.  No orders of the defined types were placed in this encounter.    Future Appointments  Date Time Provider Department Center  03/26/2024  7:10 AM CVD HVT DEVICE REMOTES [facility]  04/30/2024  7:05 AM CVD HVT DEVICE REMOTES [facility]  06/04/2024  7:00 AM CVD HVT DEVICE REMOTES [facility] H&V  06/09/2024 10:20 AM Hilty, Aviva Lemmings, MD [facility]  07/09/2024  7:05 AM CVD HVT DEVICE REMOTES [facility] H&V  08/13/2024  7:00 AM CVD HVT DEVICE REMOTES [facility] H&V  09/17/2024  7:00 AM CVD HVT DEVICE REMOTES [facility] H&V  11/11/2024  3:30 PM Lisabeth Rider, MD [facility] None    This document was completed utilizing speech recognition software. Grammatical errors, random word insertions, pronoun errors, and incomplete sentences are an occasional consequence of this system due to software limitations, ambient noise, and hardware issues. Any formal questions or concerns about the  content, text or information contained within the body of this dictation should be directly addressed to the provider for clarification.

## 2024-03-07 ENCOUNTER — Telehealth: Payer: Self-pay | Admitting: Oncology

## 2024-03-07 NOTE — Telephone Encounter (Signed)
 Devin Wright rescheduled his telephone appointment.

## 2024-03-12 ENCOUNTER — Encounter: Payer: Self-pay | Admitting: Oncology

## 2024-03-12 ENCOUNTER — Inpatient Hospital Stay: Attending: Oncology | Admitting: Oncology

## 2024-03-12 DIAGNOSIS — Z8673 Personal history of transient ischemic attack (TIA), and cerebral infarction without residual deficits: Secondary | ICD-10-CM | POA: Diagnosis not present

## 2024-03-12 DIAGNOSIS — I2510 Atherosclerotic heart disease of native coronary artery without angina pectoris: Secondary | ICD-10-CM

## 2024-03-12 NOTE — Progress Notes (Signed)
 "  [facility]  HEMATOLOGY-ONCOLOGY ELECTRONIC VISIT PROGRESS NOTE  PATIENT NAME: Devin Robert Egler Jr.   MR#: [id number] DOB: 1971-04-05  DATE OF SERVICE: 03/12/2024  Patient Care Team: Clarice Nottingham, MD as PCP - General (Internal Medicine) Ladona Heinz, MD as PCP - Cardiology (Cardiology)  I connected with the patient via telephone conference and verified that I am speaking with the correct person using two identifiers. The patient's location is at home and I am providing care from the [facility].  I discussed the limitations, risks, security and privacy concerns of performing an evaluation and management service by e-visits and the availability of in person appointments. I also discussed with the patient that there may be a patient responsible charge related to this service. The patient expressed understanding and agreed to proceed.   ASSESSMENT & PLAN:   Devin Robert Kackley Jr. is a 53 y.o. gentleman with a past medical history of hypertension, dyslipidemia, CAD s/p PCI x 2, CVAs was referred to our service in April 2025 for evaluation of possible hypercoagulable state given his history of multiple CVAs.    History of multiple cerebrovascular accidents (CVAs) Strokes occurred in February and March 2025 with no residual deficits. Extensive workup, including echocardiogram and TEE, showed no cardiac defects. Current antiplatelet therapy with aspirin  and Brilinta  is appropriate to prevent further strokes. Additional blood tests for hypercoagulable states are pending. He is concerned about the risk of future strokes and is actively seeking preventive measures.  On 12/11/2023, lupus anticoagulant, beta-2  glycoprotein antibodies, anticardiolipin antibody testing was negative.  Homocystine was elevated at 31.5 mol/L.  On his consultation with us  on 02/20/2024, hypercoagulable workup was grossly unremarkable including negative prothrombin gene mutation and factor V Leiden  mutation.  Antithrombin III  activity, protein C and S activity were all within normal limits.  D-dimer was undetectable.  No evidence of thrombophilia that would explain his history of strokes.  He was advised to continue dual antiplatelet therapy with aspirin  and Brilinta  to minimize risk of cardiovascular and cerebrovascular events.  He was advised to continue follow-up with his PCP, cardiology and neurology.  CAD (coronary artery disease), native coronary artery Coronary artery disease with stents placed in 2020 and 2024 due to blockages. Currently on aspirin  and Brilinta  to prevent arterial clots, which are platelet-rich. Full anticoagulation therapy like Eliquis or Xarelto is unnecessary unless a venous clot is documented. Current medications are sufficient to prevent further arterial events. - Continue aspirin  and Brilinta  - Continue follow-up with cardiologist  General health maintenance Screening colonoscopies and prostate checks are up to date. PSA checked at least once a year. No smoking or alcohol use since February. - Continue regular cancer screenings including colonoscopy and PSA - Maintain healthy lifestyle with focus on hydration and greens - Consider vitamin C supplementation for vascular health  Post-stroke muscle tension Muscle tension in the neck, particularly the sternocleidomastoid muscle, post-stroke. Symptoms include pressure in the back of the head, sometimes extending to the front. Neurologist suggested stress-related muscle tension. Massage therapy has been attempted with some relief. - Continue stretching exercises for neck muscle tension - Follow up with massage therapy as needed   I discussed the assessment and treatment plan with the patient. The patient was provided an opportunity to ask questions and all were answered. The patient agreed with the plan and demonstrated an understanding of the instructions. The patient was advised to call back or seek an  in-person evaluation if the symptoms worsen or if the  condition fails to improve as anticipated.    I spent 11 minutes over the phone with the patient reviewing test results, discuss management and coordination/planning of care.  Chinita Patten, MD 03/12/2024 3:45 PM [facility] - A DEPT OF [facility] [address] [place] [address] Dept: [phone] Dept Fax: [phone]   INTERVAL HISTORY:  Please see above for problem oriented charting.  The purpose of today's discussion is to explain recent lab results and to formulate plan of care.  Discussed the use of AI scribe software for clinical note transcription with the patient, who gave verbal consent to proceed.  History of Present Illness Devin Robert Stagner Jr. is a 53 year old male with a history of stroke and coronary artery disease who presents with persistent neck and head pressure.  He experiences persistent pressure in the neck, particularly in the sternocleidomastoid muscle, described as 'really aggravated'. This pressure sometimes extends to the back and front of his head, though it is not a headache. Stretching exercises have provided some relief. He attributes these symptoms to post-stroke changes, noting a loss of feeling on the left side which may contribute to muscle tension.  He has a history of stroke and coronary artery disease, with two stents placed in February 2020 and another in November 2024 following a heart attack. He is currently on Brilinta  and aspirin . Previously, he was on Brilinta  for a year after his initial stent placement and was transitioned to Plavix  before his subsequent heart attack.  Recent blood work shows improvement in several markers. His D-dimer is undetectable, blood counts and chemistries are normal, and homocysteine levels have decreased from 31.5 to 22.7. He is taking B vitamins to manage homocysteine levels. Genetic  testing for clotting disorders, including prothrombin gene mutation and factor V Leiden mutation, was performed. Protein C activity and antithrombin 3 activity were checked. Previous workup in February was performed.  He has undergone ultrasounds on his legs which did not show any clots. His kidney function is stable with a creatinine level of 1.07 and a filtration rate over 60. He has been following up with neurology and cardiology, though they have not identified a specific cause for his symptoms. He is scheduled to see a lipid specialist in August to explore potential vascular issues related to lipoprotein A.  In terms of social history, he does not smoke and has abstained from alcohol since February. He is focusing on a healthy lifestyle, including hydration and a diet rich in greens.     SUMMARY OF HEMATOLOGY HISTORY:  53 y.o. gentleman with a history of strokes and coronary artery disease who presents for hematology evaluation of unexplained clotting issues. He was referred by Dr. Clarice for evaluation of his history of strokes and clotting issues.   He has a past medical history of hypercholesteremia, hypertension, elevated LP(a), anterior wall STEMI on 12/21/2018 status post angioplasty and stenting of mid LAD, presenting with acute inferior STEMI on 09/15/2023 and underwent stenting to distal RCA.     He presented to the emergency room on 12/09/2023 with left insular cortex and left frontal operculum embolic infarct, TEE was negative for any cardiac source of cerebral emboli, no intracardiac shunting and TCD bubble study was also negative for intracardiac shunting.  Underwent loop recorder implantation 12/11/2023 to evaluate for cardiac source of cerebral emboli and discharged home.  He was also enrolled in [place] clinical trial with Asundexian (Factor XI  a inhibitor anticoagulant 50 mg versus placebo for secondary stroke prevention.   He has experienced strokes beginning in February 2025,  with the first affecting the left side and a second in March 2025 affecting the right side. He was admitted to [facility] for these events and evaluated by neurology, but the cause remains unclear. Basic labs in February were negative for clotting disorders, except for elevated homocysteine levels.   He has a history of coronary artery disease, with a myocardial infarction in 2020 during a [employer] event, leading to the placement of two stents in the lower descending artery. Another cardiac event in November 2024 resulted in a stent in the right artery feeding into the lungs. His medications have remained unchanged since these events, and he is currently taking aspirin  and Brilinta . He takes a supplement called Homocysteine Supreme, two capsules with breakfast, which he started recently after elevated homocysteine levels were noted. Extensive testing, including echocardiograms and a TEE, did not reveal any heart defects. Recent blood work showed high red blood cell counts, but he does not smoke and drinks alcohol minimally. His hemoglobin and hematocrit levels are currently normal.   He has experienced persistent pain in the calf since noticing a bruise after one of the strokes, but ultrasounds in February and March did not show any clots. He has not been set up to see a vascular surgeon yet.   He is intentionally losing weight to reduce belly fat and is currently exercising and eating smaller meals. He works in dealer for glaucoma devices and has not experienced any residual deficits from the strokes.  REVIEW OF SYSTEMS:    Review of Systems - Oncology  All other pertinent systems were reviewed with the patient and are negative.  I have reviewed the past medical history, past surgical history, social history and family history with the patient and they are unchanged from previous note.  ALLERGIES:  He has no known allergies.  MEDICATIONS:  Current Outpatient Medications  Medication  Sig Dispense Refill   aspirin  81 MG chewable tablet Chew 1 tablet (81 mg total) by mouth daily. 90 tablet 2   atorvastatin  (LIPITOR ) 40 MG tablet Take 1 tablet (40 mg total) by mouth daily. (Patient taking differently: Take 40 mg by mouth at bedtime.) 90 tablet 3   carvedilol  (COREG ) 6.25 MG tablet Take 1 tablet (6.25 mg total) by mouth 2 (two) times daily with a meal. 180 tablet 3   losartan  (COZAAR ) 50 MG tablet Take 1 tablet (50 mg total) by mouth daily. 90 tablet 3   nitroGLYCERIN  (NITROSTAT ) 0.4 MG SL tablet Place 1 tablet (0.4 mg total) under the tongue every 5 (five) minutes as needed for chest pain. (Patient not taking: Reported on 02/20/2024) 75 tablet 2   spironolactone  (ALDACTONE ) 25 MG tablet Take 0.5 tablets (12.5 mg total) by mouth daily. 45 tablet 3   ticagrelor  (BRILINTA ) 90 MG TABS tablet Take 1 tablet (90 mg total) by mouth 2 (two) times daily. 180 tablet 3   No current facility-administered medications for this visit.    PHYSICAL EXAMINATION:    Onc Performance Status - 03/12/24 1500       ECOG Perf Status   ECOG Perf Status Fully active, able to carry on all pre-disease performance without restriction      KPS SCALE   KPS % SCORE Normal, no compliants, no evidence of disease             LABORATORY DATA:   I  have reviewed the data as listed.  Recent Results (from the past 2160 hours)  Lab report - scanned     Status: None   Collection Time: 12/21/23 11:16 AM  Result Value Ref Range   EGFR (Non-African Amer.) 75     Comment: ABSTRACTED BY HIM  TSH     Status: None   Collection Time: 01/15/24  4:41 PM  Result Value Ref Range   TSH 1.330 0.450 - 4.500 uIU/mL  Lipoprotein A (LPA)     Status: Abnormal   Collection Time: 01/15/24  4:41 PM  Result Value Ref Range   Lipoprotein (a) 234.7 (H) <75.0 nmol/L    Comment: Note:  Values greater than or equal to 75.0 nmol/L may        indicate an independent risk factor for CHD,        but must be evaluated with  caution when applied        to non-Caucasian populations due to the        influence of genetic factors on Lp(a) across        ethnicities.   CBG monitoring, ED     Status: None   Collection Time: 01/16/24 12:08 AM  Result Value Ref Range   Glucose-Capillary 96 70 - 99 mg/dL    Comment: Glucose reference range applies only to samples taken after fasting for at least 8 hours.  Ethanol     Status: None   Collection Time: 01/16/24 12:10 AM  Result Value Ref Range   Alcohol, Ethyl (B) <10 <10 mg/dL    Comment: (NOTE) Lowest detectable limit for serum alcohol is 10 mg/dL.  For medical purposes only. Performed at [facility], [address] [place], [place] [address]   Protime-INR     Status: None   Collection Time: 01/16/24 12:10 AM  Result Value Ref Range   Prothrombin Time 13.8 11.4 - 15.2 seconds   INR 1.0 0.8 - 1.2    Comment: (NOTE) INR goal varies based on device and disease states. Performed at [facility], [address], [place], [place] [address]   APTT     Status: Abnormal   Collection Time: 01/16/24 12:10 AM  Result Value Ref Range   aPTT 37 (H) 24 - 36 seconds    Comment:        IF BASELINE aPTT IS ELEVATED, SUGGEST PATIENT RISK ASSESSMENT BE USED TO DETERMINE APPROPRIATE ANTICOAGULANT THERAPY. Performed at [facility], [address] [place], [place] [address]   CBC     Status: None   Collection Time: 01/16/24 12:10 AM  Result Value Ref Range   WBC 8.2 4.0 - 10.5 K/uL   RBC 5.62 4.22 - 5.81 MIL/uL   Hemoglobin 16.2 13.0 - 17.0 g/dL   HCT 52.8 60.9 - 47.9 %   MCV 83.8 80.0 - 100.0 fL   MCH 28.8 26.0 - 34.0 pg   MCHC 34.4 30.0 - 36.0 g/dL   RDW 87.5 88.4 - 84.4 %   Platelets 193 150 - 400 K/uL   nRBC 0.0 0.0 - 0.2 %    Comment: Performed at [facility], [address] [place], [place] [address]  Differential     Status: None   Collection Time: 01/16/24 12:10 AM  Result Value Ref Range   Neutrophils Relative % 60 %    Neutro Abs 4.9 1.7 - 7.7 K/uL   Lymphocytes Relative 25 %   Lymphs Abs  2.1 0.7 - 4.0 K/uL   Monocytes Relative 11 %   Monocytes Absolute 0.9 0.1 - 1.0 K/uL   Eosinophils Relative 3 %   Eosinophils Absolute 0.2 0.0 - 0.5 K/uL   Basophils Relative 1 %   Basophils Absolute 0.1 0.0 - 0.1 K/uL   Immature Granulocytes 0 %   Abs Immature Granulocytes 0.03 0.00 - 0.07 K/uL    Comment: Performed at [facility], [address] [place], [place] [address]  Comprehensive metabolic panel     Status: Abnormal   Collection Time: 01/16/24 12:10 AM  Result Value Ref Range   Sodium 139 135 - 145 mmol/L   Potassium 3.5 3.5 - 5.1 mmol/L   Chloride 106 98 - 111 mmol/L   CO2 24 22 - 32 mmol/L   Glucose, Bld 98 70 - 99 mg/dL    Comment: Glucose reference range applies only to samples taken after fasting for at least 8 hours.   BUN 13 6 - 20 mg/dL   Creatinine, Ser 8.87 0.61 - 1.24 mg/dL   Calcium  8.5 (L) 8.9 - 10.3 mg/dL   Total Protein 5.9 (L) 6.5 - 8.1 g/dL   Albumin 3.8 3.5 - 5.0 g/dL   AST 29 15 - 41 U/L   ALT 46 (H) 0 - 44 U/L   Alkaline Phosphatase 76 38 - 126 U/L   Total Bilirubin 0.7 0.0 - 1.2 mg/dL   GFR, Estimated >39 >39 mL/min    Comment: (NOTE) Calculated using the CKD-EPI Creatinine Equation (2021)    Anion gap 9 5 - 15    Comment: Performed at [facility], [address], [place], [place] [address]  I-stat chem 8, ED     Status: Abnormal   Collection Time: 01/16/24 12:13 AM  Result Value Ref Range   Sodium 141 135 - 145 mmol/L   Potassium 3.5 3.5 - 5.1 mmol/L   Chloride 104 98 - 111 mmol/L   BUN 13 6 - 20 mg/dL   Creatinine, Ser 8.89 0.61 - 1.24 mg/dL   Glucose, Bld 92 70 - 99 mg/dL    Comment: Glucose reference range applies only to samples taken after fasting for at least 8 hours.   Calcium , Ion 1.07 (L) 1.15 - 1.40 mmol/L   TCO2 25 22 - 32 mmol/L   Hemoglobin 16.0 13.0 - 17.0 g/dL   HCT 52.9 60.9 - 47.9 %  MRSA Next Gen by PCR, Nasal     Status: None    Collection Time: 01/16/24  2:24 AM   Specimen: Nasal Mucosa; Nasal Swab  Result Value Ref Range   MRSA by PCR Next Gen NOT DETECTED NOT DETECTED    Comment: (NOTE) The GeneXpert MRSA Assay (FDA approved for NASAL specimens only), is one component of a comprehensive MRSA colonization surveillance program. It is not intended to diagnose MRSA infection nor to guide or monitor treatment for MRSA infections. Test performance is not FDA approved in patients less than 61 years old. Performed at [facility], [address] [place], [place] [address]   Glucose, capillary     Status: Abnormal   Collection Time: 01/16/24  2:24 AM  Result Value Ref Range   Glucose-Capillary 118 (H) 70 - 99 mg/dL    Comment: Glucose reference range applies only to samples taken after fasting for at least 8 hours.  Glucose, capillary     Status: Abnormal   Collection Time: 01/16/24  8:04 AM  Result Value Ref  Range   Glucose-Capillary 114 (H) 70 - 99 mg/dL    Comment: Glucose reference range applies only to samples taken after fasting for at least 8 hours.  Urinalysis, Routine w reflex microscopic -Urine, Clean Catch     Status: None   Collection Time: 01/16/24 10:06 AM  Result Value Ref Range   Color, Urine YELLOW YELLOW   APPearance CLEAR CLEAR   Specific Gravity, Urine 1.028 1.005 - 1.030   pH 5.0 5.0 - 8.0   Glucose, UA NEGATIVE NEGATIVE mg/dL   Hgb urine dipstick NEGATIVE NEGATIVE   Bilirubin Urine NEGATIVE NEGATIVE   Ketones, ur NEGATIVE NEGATIVE mg/dL   Protein, ur NEGATIVE NEGATIVE mg/dL   Nitrite NEGATIVE NEGATIVE   Leukocytes,Ua NEGATIVE NEGATIVE    Comment: Performed at [facility], [address] [place], [place] [address]  Urine rapid drug screen (hosp performed)     Status: None   Collection Time: 01/16/24 10:06 AM  Result Value Ref Range   Opiates NONE DETECTED NONE DETECTED   Cocaine NONE DETECTED NONE DETECTED   Benzodiazepines NONE DETECTED NONE DETECTED   Amphetamines  NONE DETECTED NONE DETECTED   Tetrahydrocannabinol NONE DETECTED NONE DETECTED   Barbiturates NONE DETECTED NONE DETECTED    Comment: (NOTE) DRUG SCREEN FOR MEDICAL PURPOSES ONLY.  IF CONFIRMATION IS NEEDED FOR ANY PURPOSE, NOTIFY LAB WITHIN 5 DAYS.  LOWEST DETECTABLE LIMITS FOR URINE DRUG SCREEN Drug Class                     Cutoff (ng/mL) Amphetamine and metabolites    1000 Barbiturate and metabolites    200 Benzodiazepine                 200 Opiates and metabolites        300 Cocaine and metabolites        300 THC                            50 Performed at [facility], [address] [place], [place] [address]   CBC     Status: Abnormal   Collection Time: 01/16/24 10:27 AM  Result Value Ref Range   WBC 7.1 4.0 - 10.5 K/uL   RBC 5.87 (H) 4.22 - 5.81 MIL/uL   Hemoglobin 16.7 13.0 - 17.0 g/dL   HCT 51.1 60.9 - 47.9 %   MCV 83.1 80.0 - 100.0 fL   MCH 28.4 26.0 - 34.0 pg   MCHC 34.2 30.0 - 36.0 g/dL   RDW 87.5 88.4 - 84.4 %   Platelets 202 150 - 400 K/uL   nRBC 0.0 0.0 - 0.2 %    Comment: Performed at [facility], [address] [place], [place] [address]  Creatinine, serum     Status: None   Collection Time: 01/16/24 10:27 AM  Result Value Ref Range   Creatinine, Ser 1.07 0.61 - 1.24 mg/dL   GFR, Estimated >39 >39 mL/min    Comment: (NOTE) Calculated using the CKD-EPI Creatinine Equation (2021) Performed at [facility], [address] [place], [place] [address]   CUP PACEART REMOTE DEVICE CHECK     Status: None   Collection Time: 01/16/24 11:00 AM  Result Value Ref Range   Date Time Interrogation Session [id number]    Pulse Generator Manufacturer MERM    Pulse Gen Model LNQ22 LINQ II    Pulse Gen Serial Number  [id number]    Clinic Name [facility]    Implantable Pulse Generator Type ICM/ILR    Implantable Pulse Generator Implant Date [id number]    Eval Rhythm SR, noisy baseline   ANA w/Reflex if Positive     Status: None    Collection Time: 01/16/24  2:43 PM  Result Value Ref Range   Anti Nuclear Antibody (ANA) Negative Negative    Comment: (NOTE) Performed At: [facility] [address] [place], [place] [id number] Jennette Shorter MD Ey:[id number]   Homocysteine     Status: Abnormal   Collection Time: 02/20/24  8:21 AM  Result Value Ref Range   Homocysteine 22.7 (H) 0.0 - 14.5 umol/L    Comment: (NOTE) Performed At: [facility] [address] [place], [place] [id number] Jennette Shorter MD Ey:[id number]   PROTEIN S PANEL     Status: None   Collection Time: 02/20/24  8:22 AM  Result Value Ref Range   Protein S Ag, Total 84 60 - 150 %    Comment: (NOTE) This test was developed and its performance characteristics determined by [facility]. It has not been cleared or approved by the Food and Drug Administration.    Protein S Ag, Free 120 61 - 136 %   Protein S Activity 90 63 - 140 %    Comment: (NOTE) Protein S activity may be falsely increased (masking an abnormal, low result) in patients receiving direct Xa inhibitor (e.g., rivaroxaban, apixaban, edoxaban) or a direct thrombin inhibitor (e.g., dabigatran) anticoagulant treatment due to assay interference by these drugs. Performed At: [facility] [address] [place], [place] [id number] Jennette Shorter MD Ey:[id number]   Antithrombin panel     Status: None   Collection Time: 02/20/24  8:22 AM  Result Value Ref Range   Antithrombin Activity 109 75 - 135 %    Comment: (NOTE) Direct Xa inhibitor anticoagulants such as rivaroxaban, apixaban and edoxaban will lead to spuriously elevated antithrombin activity levels possibly masking a deficiency.    AT III AG PPP IMM-ACNC 74 72 - 124 %    Comment: (NOTE) This test was developed and its performance characteristics determined by [facility]. It has not been cleared or approved by the Food and Drug Administration. Performed At: [facility] [address] [place],  [place] [id number] Jennette Shorter MD Ey:[id number]   Protein C activity     Status: None   Collection Time: 02/20/24  8:22 AM  Result Value Ref Range   Protein C Activity 79 73 - 180 %    Comment: (NOTE) A deficiency of protein C (PC), either congenital or acquired, increases the risk of thromboembolism. Acquired PC deficiency occurs more frequently than congenital deficiency. PC levels can be transiently diminished after a thrombotic event or surgery or in the presence of certain anticoagulants. Heparin , direct Xa inhibitor, or thrombotic inhibitor therapy does not alter PC levels physiologically and does not interfere with this assay because it is chromogenic and clot-based. Vitamin K antagonist therapy may decrease plasma levels of functional protein C (PC) as PC is a vitamin K- dependent protein. Vitamin K deficiency, due to dietary insufficiency or malabsorption will also lead to reduced PC levels. Acquired deficiency can be found in individuals with disseminated intravascular coagulation (DIC) and sepsis. Severe hepatic disorders (hepatitis, cirrhosis, etc.), nephrotic syndrome, malignancy and inflammatory bowel disease can lead to diminished PC levels. Drug  therapy with L-asparaginse or fluorouracil can also reduce PC levels. Levels may be decreased in patients with polycythemia vera, sickle  cell disease and essential thrombocythemia. Repeat evaluation on a new plasma sample to confirm or refute this result should be considered, after ruling out acquired causes, depending on the clinical scenario. Performed At: [facility] [address] [place], [place] [id number] Jennette Shorter MD Ey:[id number]   Factor 5 leiden     Status: None   Collection Time: 02/20/24  8:22 AM  Result Value Ref Range   Recommendations-[id number]: Comment     Comment: (NOTE) Result: c.1601G>A (p.Arg534Gln) - Not Detected This result is not associated with an increased risk for  venous thromboembolism. See Additional Clinical Information and Comments. Additional Clinical Information: Venous thromboembolism is a multifactorial disease influenced by genetic, environmental, and circumstantial risk factors. The c.1601G>A (p. Arg534Gln) variant in the F5 gene, commonly referred to as Factor V Leiden, is a genetic risk factor for venous thromboembolism. Heterozygous carriers of this variant have a 6- to 8- fold increased risk for venous thromboembolism. Individuals homozygous for this variant (ie, with a copy of the variant on each chromosome) have an approximately 80-fold increased risk for venous thromboembolism. Individuals who carry both a c.*97G>A variant in the F2 gene and Factor V Leiden have an approximately 20-fold increased risk for venous thromboembolism. Risks are likely to be even higher in more complex genotype combinations in volving the F2 c.*97G>A variant and Factor V Leiden (PMID: 66325232). Additional risk factors include but are not limited to: deficiency of protein C, protein S, or antithrombin III , age, male sex, personal or family history of deep vein thromboembolism, smoking, surgery, prolonged immobilization, malignant neoplasm, tamoxifen treatment, raloxifene treatment, oral contraceptive use, hormone replacement therapy, and pregnancy. Management of thrombotic risk and thrombotic events should follow established guidelines and fit the clinical circumstance. This result cannot predict the occurrence or recurrence of a thrombotic event. Comment: Genetic counseling is recommended to discuss the potential clinical implications of positive results, as well as recommendations for testing family members. Genetic Coordinators are available for health care providers to discuss results at [phone]). Test Details: Variant Analyzed: c.1601G>A (p. Arg534Gln), referred to as Fact or V Leiden Methods/Limitations: DNA analysis of the F5  gene (NM_000130.5) was performed by PCR amplification followed by restriction enzyme analysis. The diagnostic sensitivity is >99%. Results must be combined with clinical information for the most accurate interpretation. Molecular- based testing is highly accurate, but as in any laboratory test, diagnostic errors may occur. False positive or false negative results may occur for reasons that include genetic variants, blood transfusions, bone marrow transplantation, somatic or tissue-specific mosaicism, mislabeled samples, or erroneous representation of family relationships. This test was developed and its performance characteristics determined by [facility]. It has not been cleared or approved by the Food and Drug Administration. References: Bhatt S, Taylor AK, Lozano R, Grody [facility], Signa [facility]; ACMG Professional Practice and Guidelines Committee. Addendum: [employer] of [employer] Gen[employer] consensus statement on fac tor V Leiden mutation testing. Genet Med. 2021 Mar 5. doi: 89.8961/d58563-978- 01108-x. PMID: 66325232. Hosey RUSH. Factor V Leiden Thrombophilia. 1999 May 14 (Updated 2018 Jan 4). In: Juliene POSNER, Ardinger HH, Pagon RA, et al., editors. GeneReviews(R) (Internet). [address] ([place]): [employer] , [place]; 8006-7978. Available from: [email or web address] Laurita GORMAN Waddell BRIDGETT, Huang X, Luo B, Spector EB, Ileana SHAUNNA Gal CS; ACMG Laboratory Quality Assurance Committee. Venous thromboembolism laboratory testing (factor V Leiden and factor II c.*97G>A), 2018 update: a technical standard of the [employer] of [employer] and [employer]omics (ACMG). Genet Med. 2018 Dec;20(12):1489-1498. doi: 10.1038/[id number]-z. Epub 2018 Oct 5. PMID: 69702301.  Reviewed By: Comment     Comment: (NOTE) Technical Component performed at [employer] [facility] Professional Component performed by: Fairy WENDI Aid, PhD, [facility] [id number], [facility], [address] [place]  [address] Performed At: [facility] [address] [place], [place] [id number] Loran Gales MDPhD Ey:[phone]   Prothrombin gene mutation     Status: None   Collection Time: 02/20/24  8:22 AM  Result Value Ref Range   Recommendations-PTGENE: Comment     Comment: (NOTE) Result: c.*97G>A - Not Detected This result is not associated with an increased risk for venous thromboembolism. See Additional Clinical Information and Comments. Additional Clinical Information: Venous thromboembolism is a multifactorial disease influenced by genetic, environmental, and circumstantial risk factors. The c.*97G>A variant in the F2 gene is a genetic risk factor for venous thromboembolism. Heterozygous carriers have a 2- to 4-fold increased risk for venous thromboembolism. Homozygotes for the c.*97G>A variant are rare. The annual risk of VTE in homozygotes has been reported to be 1.1%/year. Individuals who carry both a c.*97G>A variant in the F2 gene and a c.1601G>A (p. Arg534Gln) variant in the F5 gene (commonly referred to as Factor V Leiden) have an approximately 20- fold increased risk for venous thromboembolism. Risks are likely to be even higher in more complex genotype combinations involving the F2 c.*97G>A variant and Factor V Leiden (PMID:  66325232). Additional risk factors include but are not limited to: deficiency of protein C, protein S, or antithrombin III , age, male sex, personal or family history of deep vein thromboembolism, smoking, surgery, prolonged immobilization, malignant neoplasm, tamoxifen treatment, raloxifene treatment, oral contraceptive use, hormone replacement therapy, and pregnancy. Management of thrombotic risk and thrombotic events should follow established guidelines and fit the clinical circumstance. This result cannot predict the occurrence or recurrence of a thrombotic event. Comments: Genetic counseling is recommended to discuss the potential clinical implications  of positive results, as well as recommendations for testing family members. Genetic Coordinators are available for health care providers to discuss results at [phone]). Test Details: Variant analyzed: c.*97G>A, previously referred to as G20210A Methods/Limitations: DNA analysis of the F2 gene (NM_000 506.5) was performed by PCR amplification followed by restriction enzyme analysis. The diagnostic sensitivity is >99%. Results must be combined with clinical information for the most accurate interpretation. Molecular-based testing is highly accurate, but as in any laboratory test, diagnostic errors may occur. False positive or false negative results may occur for reasons that include genetic variants, blood transfusions, bone marrow transplantation, somatic or tissue-specific mosaicism, mislabeled samples, or erroneous representation of family relationships. This test was developed and its performance characteristics determined by [facility]. It has not been cleared or approved by the Food and Drug Administration. References: Bhatt S, Taylor AK, Lozano R, Grody [facility], Signa [facility]; ACMG Professional Practice and Guidelines Committee. Addendum: [employer] of [employer] consensus statement on factor V Leiden mutation testing. Genet Med. 2021 Mar 5. doi: 89.8961/d585 36-021-01108-x. PMID: 66325232. Hosey RUSH. Prothrombin Thrombophilia. 2006 Jul 25 [Updated 2021 Feb 4]. In: Juliene POSNER, Ardinger HH, Pagon RA, et al., editors. GeneReviews(R) [Internet]. [address] [facility]): [employer] , [place]; 8006-7978. Available from: [email or web address] Laurita GORMAN Waddell BRIDGETT, Huang X, Luo B, Spector EB, Ileana SHAUNNA Gal CS; ACMG Laboratory Quality Assurance Committee. Venous thromboembolism laboratory testing (factor V Leiden and factor II c.*97G>A), 2018 update: a technical standard of the [employer] of [employer] and Gen[employer] (ACMG). Genet Med.  2018 Dec;20(12):1489-1498. doi: 10.1038/s41436-(336) 144-4515-z. Epub 2018 Oct 5. PMID: 69702301.    Reviewed by: Comment  Comment: (NOTE) Technical Component performed at [employer] [facility] Professional Component performed by: Slater LELON Donate, PhD, [facility] [id number], [facility], [address] [place] [address] Performed At: [facility] RTP [address] [place], [place] [id number] Loran Gales MDPhD Ey:[phone]   D-dimer, quantitative     Status: None   Collection Time: 02/20/24  8:22 AM  Result Value Ref Range   D-Dimer, Quant <0.27 0.00 - 0.50 ug/mL-FEU    Comment: (NOTE) At the manufacturer cut-off value of 0.5 g/mL FEU, this assay has a negative predictive value of 95-100%.This assay is intended for use in conjunction with a clinical pretest probability (PTP) assessment model to exclude pulmonary embolism (PE) and deep venous thrombosis (DVT) in outpatients suspected of PE or DVT. Results should be correlated with clinical presentation. Performed at [facility], [address], [place], [place] [address]   CMP (Cancer Center only)     Status: Abnormal   Collection Time: 02/20/24  8:22 AM  Result Value Ref Range   Sodium 140 135 - 145 mmol/L   Potassium 4.4 3.5 - 5.1 mmol/L   Chloride 106 98 - 111 mmol/L   CO2 30 22 - 32 mmol/L   Glucose, Bld 80 70 - 99 mg/dL    Comment: Glucose reference range applies only to samples taken after fasting for at least 8 hours.   BUN 12 6 - 20 mg/dL   Creatinine 8.92 9.38 - 1.24 mg/dL   Calcium  9.1 8.9 - 10.3 mg/dL   Total Protein 6.5 6.5 - 8.1 g/dL   Albumin 4.5 3.5 - 5.0 g/dL   AST 27 15 - 41 U/L   ALT 40 0 - 44 U/L   Alkaline Phosphatase 84 38 - 126 U/L   Total Bilirubin 0.8 0.0 - 1.2 mg/dL   GFR, Estimated >39 >39 mL/min    Comment: (NOTE) Calculated using the CKD-EPI Creatinine Equation (2021)    Anion gap 4 (L) 5 - 15    Comment: Performed at [facility], [address], [place], [place]  [address]  CBC with Differential (Cancer Center Only)     Status: None   Collection Time: 02/20/24  8:22 AM  Result Value Ref Range   WBC Count 5.7 4.0 - 10.5 K/uL   RBC 5.35 4.22 - 5.81 MIL/uL   Hemoglobin 15.4 13.0 - 17.0 g/dL   HCT 55.9 60.9 - 47.9 %   MCV 82.2 80.0 - 100.0 fL   MCH 28.8 26.0 - 34.0 pg   MCHC 35.0 30.0 - 36.0 g/dL   RDW 87.3 88.4 - 84.4 %   Platelet Count 211 150 - 400 K/uL   nRBC 0.0 0.0 - 0.2 %   Neutrophils Relative % 75 %   Neutro Abs 4.3 1.7 - 7.7 K/uL   Lymphocytes Relative 14 %   Lymphs Abs 0.8 0.7 - 4.0 K/uL   Monocytes Relative 8 %   Monocytes Absolute 0.5 0.1 - 1.0 K/uL   Eosinophils Relative 2 %   Eosinophils Absolute 0.1 0.0 - 0.5 K/uL   Basophils Relative 1 %   Basophils Absolute 0.0 0.0 - 0.1 K/uL   Immature Granulocytes 0 %   Abs Immature Granulocytes 0.02 0.00 - 0.07 K/uL    Comment: Performed at [facility], [address], [place], [place] [address]  CUP PACEART REMOTE DEVICE CHECK     Status: None   Collection Time: 02/20/24 11:00 AM  Result Value Ref Range  Date Time Interrogation Session [phone]    Pulse Generator Manufacturer MERM    Pulse Gen Model [id number] GARR HEATH    Pulse Gen Serial Number [id number]    Clinic Name [facility]    Implantable Pulse Generator Type ICM/ILR    Implantable Pulse Generator Implant Date [id number]      RADIOGRAPHIC STUDIES:  No recent pertinent imaging studies available to review.  No orders of the defined types were placed in this encounter.    Future Appointments  Date Time Provider Department Center  03/26/2024  7:10 AM CVD HVT DEVICE REMOTES [facility] H[facility]V  04/30/2024  7:05 AM CVD HVT DEVICE REMOTES [facility] H&V  06/04/2024  7:00 AM CVD HVT DEVICE REMOTES [facility] H&V  06/09/2024 10:20 AM Hilty, Vinie BROCKS, MD [facility]  07/09/2024  7:05 AM CVD HVT DEVICE REMOTES [facility] H[facility]V  08/13/2024  7:00 AM CVD HVT DEVICE REMOTES [facility] H&V  09/17/2024  7:00 AM CVD HVT  DEVICE REMOTES [facility] H&V  11/11/2024  3:30 PM Rosemarie Eather RAMAN, MD [facility] None    This document was completed utilizing speech recognition software. Grammatical errors, random word insertions, pronoun errors, and incomplete sentences are an occasional consequence of this system due to software limitations, ambient noise, and hardware issues. Any formal questions or concerns about the content, text or information contained within the body of this dictation should be directly addressed to the provider for clarification.  "

## 2024-03-18 ENCOUNTER — Encounter: Payer: Self-pay | Admitting: Oncology

## 2024-03-18 NOTE — Assessment & Plan Note (Signed)
 Coronary artery disease with stents placed in 2020 and 2024 due to blockages. Currently on aspirin  and Brilinta  to prevent arterial clots, which are platelet-rich. Full anticoagulation therapy like Eliquis or Xarelto is unnecessary unless a venous clot is documented. Current medications are sufficient to prevent further arterial events. - Continue aspirin  and Brilinta  - Continue follow-up with cardiologist

## 2024-03-18 NOTE — Assessment & Plan Note (Signed)
 Strokes occurred in February and March 2025 with no residual deficits. Extensive workup, including echocardiogram and TEE, showed no cardiac defects. Current antiplatelet therapy with aspirin  and Brilinta  is appropriate to prevent further strokes. Additional blood tests for hypercoagulable states are pending. He is concerned about the risk of future strokes and is actively seeking preventive measures.  On 12/11/2023, lupus anticoagulant, beta-2  glycoprotein antibodies, anticardiolipin antibody testing was negative.  Homocystine was elevated at 31.5 mol/L.  On his consultation with us  on 02/20/2024, hypercoagulable workup was grossly unremarkable including negative prothrombin gene mutation and factor V Leiden mutation.  Antithrombin III  activity, protein C and S activity were all within normal limits.  D-dimer was undetectable.  No evidence of thrombophilia that would explain his history of strokes.  He was advised to continue dual antiplatelet therapy with aspirin  and Brilinta  to minimize risk of cardiovascular and cerebrovascular events.  He was advised to continue follow-up with his PCP, cardiology and neurology.

## 2024-03-24 LAB — CUP PACEART REMOTE DEVICE CHECK
Date Time Interrogation Session: 20250531110046
Implantable Pulse Generator Implant Date: 20250218

## 2024-03-26 ENCOUNTER — Ambulatory Visit (INDEPENDENT_AMBULATORY_CARE_PROVIDER_SITE_OTHER)

## 2024-03-26 DIAGNOSIS — I2119 ST elevation (STEMI) myocardial infarction involving other coronary artery of inferior wall: Secondary | ICD-10-CM | POA: Diagnosis not present

## 2024-03-27 ENCOUNTER — Ambulatory Visit: Payer: Self-pay | Admitting: Cardiovascular Disease

## 2024-04-01 ENCOUNTER — Inpatient Hospital Stay: Admitting: Neurology

## 2024-04-02 DIAGNOSIS — E610 Copper deficiency: Secondary | ICD-10-CM | POA: Diagnosis not present

## 2024-04-02 DIAGNOSIS — R7401 Elevation of levels of liver transaminase levels: Secondary | ICD-10-CM | POA: Diagnosis not present

## 2024-04-02 DIAGNOSIS — Z713 Dietary counseling and surveillance: Secondary | ICD-10-CM | POA: Diagnosis not present

## 2024-04-02 DIAGNOSIS — Z13220 Encounter for screening for lipoid disorders: Secondary | ICD-10-CM | POA: Diagnosis not present

## 2024-04-02 DIAGNOSIS — R739 Hyperglycemia, unspecified: Secondary | ICD-10-CM | POA: Diagnosis not present

## 2024-04-02 DIAGNOSIS — E744 Disorders of pyruvate metabolism and gluconeogenesis: Secondary | ICD-10-CM | POA: Diagnosis not present

## 2024-04-02 DIAGNOSIS — I21A9 Other myocardial infarction type: Secondary | ICD-10-CM | POA: Diagnosis not present

## 2024-04-02 DIAGNOSIS — R748 Abnormal levels of other serum enzymes: Secondary | ICD-10-CM | POA: Diagnosis not present

## 2024-04-03 NOTE — Addendum Note (Signed)
 Addended by: Lott Rouleau A on: 04/03/2024 02:54 PM   Modules accepted: Orders

## 2024-04-03 NOTE — Progress Notes (Signed)
 [facility] Summary Report / Loop Recorder

## 2024-04-22 ENCOUNTER — Ambulatory Visit

## 2024-04-22 DIAGNOSIS — I2119 ST elevation (STEMI) myocardial infarction involving other coronary artery of inferior wall: Secondary | ICD-10-CM

## 2024-04-23 LAB — CUP PACEART REMOTE DEVICE CHECK
Date Time Interrogation Session: 20250701110108
Implantable Pulse Generator Implant Date: 20250218

## 2024-04-28 ENCOUNTER — Ambulatory Visit: Payer: Self-pay | Admitting: Cardiovascular Disease

## 2024-04-30 ENCOUNTER — Encounter

## 2024-05-15 NOTE — Progress Notes (Signed)
 [facility] Summary Report / Loop Recorder

## 2024-05-23 ENCOUNTER — Ambulatory Visit

## 2024-05-23 DIAGNOSIS — I21A9 Other myocardial infarction type: Secondary | ICD-10-CM | POA: Diagnosis not present

## 2024-05-23 DIAGNOSIS — I2119 ST elevation (STEMI) myocardial infarction involving other coronary artery of inferior wall: Secondary | ICD-10-CM

## 2024-05-23 DIAGNOSIS — E744 Disorders of pyruvate metabolism and gluconeogenesis: Secondary | ICD-10-CM | POA: Diagnosis not present

## 2024-05-23 DIAGNOSIS — R7401 Elevation of levels of liver transaminase levels: Secondary | ICD-10-CM | POA: Diagnosis not present

## 2024-05-23 DIAGNOSIS — M311 Thrombotic microangiopathy, unspecified: Secondary | ICD-10-CM | POA: Diagnosis not present

## 2024-05-23 LAB — CUP PACEART REMOTE DEVICE CHECK
Date Time Interrogation Session: 20250801110908
Implantable Pulse Generator Implant Date: 20250218

## 2024-06-03 ENCOUNTER — Ambulatory Visit: Payer: Self-pay | Admitting: Cardiovascular Disease

## 2024-06-04 ENCOUNTER — Encounter

## 2024-06-09 ENCOUNTER — Ambulatory Visit (HOSPITAL_BASED_OUTPATIENT_CLINIC_OR_DEPARTMENT_OTHER): Admitting: Internal Medicine

## 2024-06-09 ENCOUNTER — Encounter (HOSPITAL_BASED_OUTPATIENT_CLINIC_OR_DEPARTMENT_OTHER): Payer: Self-pay | Admitting: Internal Medicine

## 2024-06-09 VITALS — BP 110/68 | HR 69 | Ht 74.0 in | Wt 260.6 lb

## 2024-06-09 DIAGNOSIS — E7841 Elevated Lipoprotein(a): Secondary | ICD-10-CM | POA: Diagnosis not present

## 2024-06-09 DIAGNOSIS — I2510 Atherosclerotic heart disease of native coronary artery without angina pectoris: Secondary | ICD-10-CM

## 2024-06-09 DIAGNOSIS — Z8673 Personal history of transient ischemic attack (TIA), and cerebral infarction without residual deficits: Secondary | ICD-10-CM | POA: Diagnosis not present

## 2024-06-09 DIAGNOSIS — E785 Hyperlipidemia, unspecified: Secondary | ICD-10-CM

## 2024-06-09 NOTE — Patient Instructions (Signed)
 Medication Instructions:   WE WILL TAKE OFF YOUR LIPITOR  AND YOUR SPIRONOLACTONE   *If you need a refill on your cardiac medications before your next appointment, please call your pharmacy*    Testing/Procedures:  Genetic test for E78.01 AND E78.41 ordered ([facility]) Cheek swab completed in office Specimen and necessary paperwork mailed. ID: [id number]    Follow-Up:  AS NEEDED

## 2024-06-09 NOTE — Progress Notes (Unsigned)
 LIPID CLINIC CONSULT NOTE  Chief Complaint:  Manage dyslipidemia  Primary Care Physician: Neysa Sensor, MD  Primary Cardiologist:  Gordy Bergamo, MD  HPI:  Devin Robert Burundi Jr. is a 53 y.o. male who is being seen today for the evaluation of lipidemia at the request of Clarice Nottingham, MD. this is a pleasant 53 year old male kindly referred for evaluation management of dyslipidemia.  He has a history of coronary artery disease followed by Dr. Bergamo and has had prior coronary revascularization.  He had previously had a marked dyslipidemia with LDL greater than 190 however recent labs show marked improvement in his lipids with total cholesterol 113, HDL 34, triglycerides 157 and LDL 48.  He also had testing of LP(a) which was elevated at 234 nmol/L.  Unfortunately he has had 2 admissions for stroke this year.  He has been maintained on dual antiplatelet therapy with aspirin  and Brilinta  although previously was on an investigational anticoagulant.  He is no longer in enrolled in that trial.  PMHx:  Past Medical History:  Diagnosis Date   Coronary artery disease    Family history of heart disease    HTN (hypertension)    Hyperlipidemia    Palpitations    STEMI (ST elevation myocardial infarction) (HCC)    PCI/DES to p/mLAD, 60% lcx, 40% ostial LM, and 60% PDA, EF 45%    Past Surgical History:  Procedure Laterality Date   CORONARY/GRAFT ACUTE MI REVASCULARIZATION N/A 12/21/2018   Procedure: Coronary/Graft Acute MI Revascularization;  Surgeon: Claudene Victory ORN, MD;  Location: [facility] INVASIVE CV LAB;  Service: Cardiovascular;  Laterality: N/A;   CORONARY/GRAFT ACUTE MI REVASCULARIZATION N/A 09/14/2023   Procedure: Coronary/Graft Acute MI Revascularization;  Surgeon: Bergamo Gordy, MD;  [facility] INVASIVE CV LAB;  Service: Cardiovascular;  Laterality: N/A;   LEFT HEART CATH AND CORONARY ANGIOGRAPHY N/A 12/21/2018   Procedure: LEFT HEART CATH AND CORONARY ANGIOGRAPHY;  Surgeon: Claudene Victory ORN, MD;   Location: [facility] INVASIVE CV LAB;  Service: Cardiovascular;  Laterality: N/A;   LOOP RECORDER INSERTION N/A 12/11/2023   Procedure: LOOP RECORDER INSERTION;  Surgeon: Nancey Eulas BRAVO, MD;  Location: [facility] INVASIVE CV LAB;  Service: Cardiovascular;  Laterality: N/A;   TRANSESOPHAGEAL ECHOCARDIOGRAM (CATH LAB) N/A 12/11/2023   Procedure: TRANSESOPHAGEAL ECHOCARDIOGRAM;  Surgeon: Jeffrie Oneil BROCKS, MD;  Location: [facility] INVASIVE CV LAB;  Service: Cardiovascular;  Laterality: N/A;    FAMHx:  Family History  Problem Relation Age of Onset   Heart disease Mother    Heart disease Father 65       CABG   Hypertension Father    Aortic aneurysm Father 15   Skin cancer Maternal Grandmother    Heart failure Maternal Grandfather    COPD Maternal Grandfather    Diabetes Paternal Grandmother     SOCHx:   reports that he has never smoked. He has never used smokeless tobacco. He reports that he does not currently use alcohol. He reports that he does not use drugs.  ALLERGIES:  No Known Allergies  ROS: Pertinent items noted in HPI and remainder of comprehensive ROS otherwise negative.  HOME MEDS: Current Outpatient Medications on File Prior to Visit  Medication Sig Dispense Refill   aspirin  81 MG chewable tablet Chew 1 tablet (81 mg total) by mouth daily. 90 tablet 2   carvedilol  (COREG ) 6.25 MG tablet Take 1 tablet (6.25 mg total) by mouth 2 (two) times daily with a meal. 180 tablet 3   losartan  (COZAAR ) 50 MG tablet  Take 1 tablet (50 mg total) by mouth daily. 90 tablet 3   nitroGLYCERIN  (NITROSTAT ) 0.4 MG SL tablet Place 1 tablet (0.4 mg total) under the tongue every 5 (five) minutes as needed for chest pain. 75 tablet 2   ticagrelor  (BRILINTA ) 90 MG TABS tablet Take 1 tablet (90 mg total) by mouth 2 (two) times daily. 180 tablet 3   atorvastatin  (LIPITOR ) 40 MG tablet Take 1 tablet (40 mg total) by mouth daily. (Patient not taking: Reported on 06/09/2024) 90 tablet 3   spironolactone  (ALDACTONE ) 25 MG  tablet Take 0.5 tablets (12.5 mg total) by mouth daily. (Patient not taking: Reported on 06/09/2024) 45 tablet 3   No current facility-administered medications on file prior to visit.    LABS/IMAGING: No results found for this or any previous visit (from the past 48 hours). No results found.  LIPID PANEL:    Component Value Date/Time   CHOL 124 12/09/2023 2306   CHOL 114 10/19/2022 0929   TRIG 159 (H) 12/09/2023 2306   HDL 28 (L) 12/09/2023 2306   HDL 36 (L) 10/19/2022 0929   CHOLHDL 4.4 12/09/2023 2306   VLDL 32 12/09/2023 2306   LDLCALC 64 12/09/2023 2306   LDLCALC 54 10/19/2022 0929   LDLDIRECT 72 07/25/2019 1412    Lipoprotein (a)  Date/Time Value Ref Range Status  01/15/2024 04:41 PM 234.7 (H) <75.0 nmol/L Final    Comment:    Note:  Values greater than or equal to 75.0 nmol/L may        indicate an independent risk factor for CHD,        but must be evaluated with caution when applied        to non-Caucasian populations due to the        influence of genetic factors on Lp(a) across        ethnicities.      WEIGHTS: Wt Readings from Last 3 Encounters:  06/09/24 260 lb 9.6 oz (118.2 kg)  02/20/24 258 lb 9.6 oz (117.3 kg)  02/06/24 259 lb 9.6 oz (117.8 kg)    VITALS: BP 110/68   Pulse 69   Ht 6' 2 (1.88 m)   Wt 260 lb 9.6 oz (118.2 kg)   SpO2 97%   BMI 33.46 kg/m   EXAM: Deferred  EKG: Deferred  ASSESSMENT: Dyslipidemia, goal LDL less than 55 Elevated OE(j)-765 nmol/L Coronary artery disease with prior revascularization History of multiple strokes  PLAN: 1.   Mr. Burundi was referred for recurrent strokes and is known to have an elevated LP(a).  His LDL is well-controlled, even below a very high risk target of 55.  He reports he has discontinued atorvastatin  and spironolactone .  With his high LP(a) he may benefit from a PCSK9 inhibitor.  We also discussed genetic testing today as an option to see if there is another potential treatable explanation for  his recurrent strokes.  Seen with genetic testing and I will contact him with those results once they are available likely in a few weeks..  Thanks for the kind referral.  Devin KYM Maxcy, MD, [facility], FNLA, FACP  [place]  [facility]  Medical Director of the Advanced Lipid Disorders &  Cardiovascular Risk Reduction Clinic Diplomate of the American Board of Clinical Lipidology Attending Cardiologist  Direct Dial: [phone]  Fax: [phone]  Website:  [email or web address] Devin Wright 06/09/2024, 10:53 AM

## 2024-06-23 ENCOUNTER — Ambulatory Visit

## 2024-06-23 DIAGNOSIS — Z8673 Personal history of transient ischemic attack (TIA), and cerebral infarction without residual deficits: Secondary | ICD-10-CM

## 2024-06-25 LAB — CUP PACEART REMOTE DEVICE CHECK
Date Time Interrogation Session: 20250901110317
Implantable Pulse Generator Implant Date: 20250218

## 2024-06-26 ENCOUNTER — Ambulatory Visit: Payer: Self-pay | Admitting: Cardiovascular Disease

## 2024-07-01 NOTE — Progress Notes (Signed)
 Remote Loop Recorder Transmission

## 2024-07-07 ENCOUNTER — Encounter: Payer: Self-pay | Admitting: Internal Medicine

## 2024-07-07 ENCOUNTER — Encounter (HOSPITAL_BASED_OUTPATIENT_CLINIC_OR_DEPARTMENT_OTHER): Payer: Self-pay | Admitting: Internal Medicine

## 2024-07-07 DIAGNOSIS — E88819 Insulin resistance, unspecified: Secondary | ICD-10-CM | POA: Diagnosis not present

## 2024-07-07 DIAGNOSIS — E7211 Homocystinuria: Secondary | ICD-10-CM | POA: Diagnosis not present

## 2024-07-07 DIAGNOSIS — I679 Cerebrovascular disease, unspecified: Secondary | ICD-10-CM | POA: Diagnosis not present

## 2024-07-07 DIAGNOSIS — E669 Obesity, unspecified: Secondary | ICD-10-CM | POA: Diagnosis not present

## 2024-07-07 DIAGNOSIS — R5382 Chronic fatigue, unspecified: Secondary | ICD-10-CM | POA: Diagnosis not present

## 2024-07-07 NOTE — Telephone Encounter (Signed)
 Thank you for you  excellent recommendations and follow up

## 2024-07-09 ENCOUNTER — Encounter

## 2024-07-10 DIAGNOSIS — G4733 Obstructive sleep apnea (adult) (pediatric): Secondary | ICD-10-CM | POA: Diagnosis not present

## 2024-07-21 NOTE — Progress Notes (Signed)
 Remote Loop Recorder Transmission

## 2024-07-24 ENCOUNTER — Ambulatory Visit

## 2024-07-24 DIAGNOSIS — I2119 ST elevation (STEMI) myocardial infarction involving other coronary artery of inferior wall: Secondary | ICD-10-CM | POA: Diagnosis not present

## 2024-07-24 LAB — CUP PACEART REMOTE DEVICE CHECK
Date Time Interrogation Session: 20251002110015
Implantable Pulse Generator Implant Date: 20250218

## 2024-07-24 NOTE — Progress Notes (Signed)
 Remote Loop Recorder Transmission

## 2024-07-25 NOTE — Progress Notes (Signed)
 Remote Loop Recorder Transmission

## 2024-08-04 ENCOUNTER — Ambulatory Visit: Payer: Self-pay | Admitting: Cardiovascular Disease

## 2024-08-13 ENCOUNTER — Encounter

## 2024-08-13 DIAGNOSIS — R7401 Elevation of levels of liver transaminase levels: Secondary | ICD-10-CM | POA: Diagnosis not present

## 2024-08-14 DIAGNOSIS — E785 Hyperlipidemia, unspecified: Secondary | ICD-10-CM | POA: Diagnosis not present

## 2024-08-14 DIAGNOSIS — Z23 Encounter for immunization: Secondary | ICD-10-CM | POA: Diagnosis not present

## 2024-08-14 DIAGNOSIS — R7401 Elevation of levels of liver transaminase levels: Secondary | ICD-10-CM | POA: Diagnosis not present

## 2024-08-25 ENCOUNTER — Ambulatory Visit (INDEPENDENT_AMBULATORY_CARE_PROVIDER_SITE_OTHER)

## 2024-08-25 DIAGNOSIS — I2119 ST elevation (STEMI) myocardial infarction involving other coronary artery of inferior wall: Secondary | ICD-10-CM

## 2024-08-25 LAB — CUP PACEART REMOTE DEVICE CHECK
Date Time Interrogation Session: 20251102235147
Implantable Pulse Generator Implant Date: 20250218

## 2024-08-27 NOTE — Progress Notes (Signed)
 Remote Loop Recorder Transmission

## 2024-08-28 ENCOUNTER — Ambulatory Visit: Payer: Self-pay | Admitting: Cardiovascular Disease

## 2024-08-31 ENCOUNTER — Other Ambulatory Visit: Payer: Self-pay | Admitting: Physician Assistant

## 2024-08-31 DIAGNOSIS — I5042 Chronic combined systolic (congestive) and diastolic (congestive) heart failure: Secondary | ICD-10-CM

## 2024-09-03 NOTE — Progress Notes (Unsigned)
 Cardiology Office Note   Date:  09/05/2024  ID:  Devin Robert Negash Jr., DOB Aug 21, 1971, MRN [id number] PCP: Neysa Sensor, MD  [facility] Providers Cardiologist:  Gordy Bergamo, MD     History of Present Illness Devin Robert Bergemann Jr. is a 53 y.o. male with a past medical history of heart failure, CAD with interventions as outlined below, stroke, hypertension, dyslipidemia, elevated LP(a), palpitations  12/11/2023 ILR implantation for history of cryptogenic stroke 12/11/2023 negative transcranial doppler bubble study 09/14/2023 cardiac cath DES of the distal RCA, previously placed DES to the mid LAD widely patent 12/21/2018 cardiac cath DES to the mid LAD with 3 x 32 mm 03/07/2019 carotid duplex no evidence of stenosis  He initially established care with Dr. Court in 2015 for the evaluation of palpitations.    He was not seen by our practice again until 2020, he had presented to the hospital with chest discomfort and arm pain, he ruled in for STEMI, found to have 100% occluded LAD treated with overlapping stents, echo at that time revealed an EF of 40%.    In November 2024 he presented to the ED with chest pain and diaphoresis, EKG revealed ST elevation in the inferior leads and he was taken to the Cath Lab, notes indicate he had resolution of his STEMI during the procedure and subsequently underwent DES x 1 to the distal RCA, previously placed LAD stents were patent, was recommended to continue DAPT x 1 year.  On 12/09/2023 he presented to the emergency room with abrupt onset of difficulty speaking, code stroke was activated, was taken for CT scan revealing concern for acute left MCA >> symptoms were mild so he did not receive TNK >> MRI confirmed left MCA >> workup was overall unrevealing including bubble study, hypercoagulable lab work.  01/16/2024 he presented to the emergency room again with left facial droop and left-sided weakness, CT angiogram showed severely stenotic right M2. ILR  was implanted on 12/11/2023 for cryptogenic stroke.   He presents today for follow-up of his CAD.  He has been doing okay, bothered by some fatigue, concerned of the implications of this, also has some left sided chest symptoms that are hard for him to decipher but not reminiscent of prior episodes of angina.  He is able to exercise some however in the past he has historically been doing high intensity workouts and would have some type of event whether MI or stroke afterwards.  He is following with an integrative medicine practice, provides recent testing from their office indicating an LP(a) greater than 200, and LDL of 143.  He has been evaluated by Dr. Neysa and started on CPAP within the last month, having some issues adjusting to that.  Continues to work full-time. We spent some time discussing lifestyle modifications, he previously tried Wegovy  to help with MACE however, had very unpleasant side effects. Mentions he is waiting to see if he is chosen for a clinical trial for elevated LP(a). He has discontinued his statin some time ago, concerned with potential adverse effects. He denies chest pain, palpitations, dyspnea, pnd, orthopnea, n, v, dizziness, syncope, edema, weight gain, or early satiety.   ROS: Review of Systems  Constitutional:  Positive for malaise/fatigue.  All other systems reviewed and are negative.    Studies Reviewed      Cardiac Studies & Procedures   ______________________________________________________________________________________________ CARDIAC CATHETERIZATION  CARDIAC CATHETERIZATION 09/14/2023  Conclusion Images from the original result were not included.    Ost RCA lesion  is 30% stenosed.  Left Heart Catheterization and angioplasty 09/14/23: Hemodynamic data: LV 1 and 26/26, EDP 29 mmHg.  Ao 137/88, mean 109 mmHg.  No pressure gradient across the aortic valve.  Angiographic data: LV: Normal LV systolic function.  LV function not adequately evaluated for  wall motion abnormality due to  PVCs. LM: Large-caliber vessel, distal left main has a 20% stenosis.  Long vessel. LAD: Large-caliber vessel, gives origin to very small diagonal branches.  3.0 x 32 mm Synergy DES placed in the mid LAD is widely patent from 12/21/2018. RI: It is a moderate caliber vessel but a very large vessel and has a secondary branch.  Proximal segment has diffuse disease about 20 to 30%. LCx: Small vessel, has a 70% ostial and proximal stenosis, gives origin to high OM1 and continues in the AV groove distally. RCA: Very large caliber vessel, has a ostial 30% stenosis.  It gives origin to a small PDA and continues as a large PL branch with secondary branches.  There is ulcerated 80% stenosis after the origin of PDA and secondary PL branch is occluded with thrombus, small vessel.  Intervention data: Successful stenting of the distal RCA with implantation of a 3.5 x 24 mm Synergy XD DES deployed at 13 atmospheric pressure for 60 seconds and proximal optimization with 3.5 x 12 mm Idabel balloon at 20 atmospheric pressure for 60 seconds.  Stenosis reduced from 80% to 0% with TIMI-3 to TIMI-3 flow.  There was also improvement in PL branch and secondary PL branch postangioplasty.  Impression and recommendations: Patient's STEMI resolved in the middle of the cardiac catheterization with improvement in symptoms of chest pain and ST segment elevations.  However the distal RCA stenosis which was the culprit appeared hazy and high-grade and also had thrombotic embolization distally into secondary small PL branch hence I felt the lesion was significant and have to proceed with PCI.  Patient will potentially be discharged home in 24 to 48 hours depending upon his recovery.  He will need aggressive risk factor modification.  Will also look at evaluation for Lp(a) clinical trials.  Findings Coronary Findings Diagnostic  Dominance: Right  Left Main Ost LM to Mid LM lesion is 20% stenosed.  Left  Anterior Descending Non-stenotic Prox LAD lesion was previously treated.  Ramus Intermedius Vessel is small. Ost Ramus to Ramus lesion is 30% stenosed.  Left Circumflex Ost Cx to Prox Cx lesion is 70% stenosed.  Right Coronary Artery Ost RCA lesion is 30% stenosed.  Right Posterior Atrioventricular Artery RPAV lesion is 80% stenosed.  Third Right Posterolateral Branch 3rd RPL lesion is 100% stenosed.  Intervention  RPAV lesion Stent Lesion length:  20 mm. CATH LAUNCHER [id number] guide catheter was inserted. Lesion crossed with guidewire using a WIRE RUNTHROUGH .[id number]. Pre-stent angioplasty was performed using a BALLN [place] EMERGE MR 3.5X12. A drug-eluting stent was successfully placed using a SYNERGY XD 3.50X24. Maximum pressure: 16 atm. Inflation time: 60 sec. Stent strut is well apposed. Post-stent angioplasty was not performed. Post-Intervention Lesion Assessment The intervention was successful. Pre-interventional TIMI flow is 3. Post-intervention TIMI flow is 3. There is a 0% residual stenosis post intervention.   CARDIAC CATHETERIZATION  CARDIAC CATHETERIZATION 12/21/2018  Conclusion  A stent was successfully placed.   Anterior ST elevation myocardial infarction commencing at 10:45 AM  Eccentric 40% ostial left main  Total occlusion proximal to mid LAD treated with overlapping Onyx 3.0 mm stents deployed at 14 atm reducing 100% stenosis to 0% with  TIMI grade III flow.  First diagonal contains 90% mid vessel stenosis.  The diagonal is moderate in size.  50% ostial to proximal circumflex.  The circumflex supplies a small territory.  RCA is large and contains diffuse luminal irregularities but no high-grade obstruction.  PDA contains ostial 60% narrowing  Apical severe hypokinesis with EF 40%.  LVEDP 28 mmHg.  RECOMMENDATIONS:   Dual antiplatelet therapy x12 months  Aggressive risk factor modification  Beta-blocker and ARB therapy  Clinical course will  determine discharge but could potentially be discharged as early as 48 hours if no complications.  Findings Coronary Findings Diagnostic  Dominance: Right  Left Main Ost LM to Mid LM lesion is 40% stenosed.  Left Anterior Descending Prox LAD lesion is 100% stenosed.  Ramus Intermedius Vessel is small.  Left Circumflex Ost Cx to Prox Cx lesion is 60% stenosed.  Right Coronary Artery Prox RCA to Mid RCA lesion is 25% stenosed.  Intervention  Prox LAD lesion Stent A stent was successfully placed. Post-Intervention Lesion Assessment The intervention was successful. There is a 0% residual stenosis post intervention.     ECHOCARDIOGRAM  ECHOCARDIOGRAM COMPLETE 12/10/2023  Narrative ECHOCARDIOGRAM REPORT    Patient Name:   Devin Albarran Nicklas Jr. Date of Exam: 12/10/2023 Medical Rec #:  [id number]               Height:       74.0 in Accession #:    [id number]              Weight:       265.4 lb Date of Birth:  07-Jun-1971                BSA:          2.451 m Patient Age:    53 years                BP:           117/73 mmHg Patient Gender: M                       HR:           64 bpm. Exam Location:  Inpatient  Procedure: 2D Echo, Cardiac Doppler, Color Doppler and Intracardiac Opacification Agent (Both Spectral and Color Flow Doppler were utilized during procedure).  Indications:    Stroke  History:        Patient has prior history of Echocardiogram examinations, most recent 09/15/2023. Cardiomyopathy and CHF, Previous Myocardial Infarction; Risk Factors:Hypertension.  Sonographer:    Amy Chionchio Referring Phys: [id number] MCNEILL P KIRKPATRICK  IMPRESSIONS   1. Left ventricular ejection fraction, by estimation, is 45 to 50%. The left ventricle has mildly decreased function. The left ventricle demonstrates regional wall motion abnormalities (see scoring diagram/findings for description). There is mild left ventricular hypertrophy of the basal-septal segment. Left  ventricular diastolic parameters are indeterminate. There is akinesis of the left ventricular, apical segment. There is hypokinesis of the left ventricular, apical septal wall, inferior wall and anterior wall. 2. Right ventricular systolic function is normal. The right ventricular size is mildly enlarged. Tricuspid regurgitation signal is inadequate for assessing PA pressure. 3. The mitral valve is normal in structure. Trivial mitral valve regurgitation. No evidence of mitral stenosis. The mean mitral valve gradient is 1.0 mmHg. 4. The aortic valve is tricuspid. Aortic valve regurgitation is not visualized. Aortic valve sclerosis/calcification is present, without any evidence of aortic stenosis. Aortic valve area,  by VTI measures 2.67 cm. Aortic valve mean gradient measures 3.0 mmHg. Aortic valve Vmax measures 1.12 m/s. 5. The inferior vena cava is normal in size with greater than 50% respiratory variability, suggesting right atrial pressure of 3 mmHg.  Conclusion(s)/Recommendation(s): No intracardiac source of embolism detected on this transthoracic study. Consider a transesophageal echocardiogram to exclude cardiac source of embolism if clinically indicated.  FINDINGS Left Ventricle: Left ventricular ejection fraction, by estimation, is 45 to 50%. The left ventricle has mildly decreased function. The left ventricle demonstrates regional wall motion abnormalities. Strain imaging was not performed. The left ventricular internal cavity size was normal in size. There is mild left ventricular hypertrophy of the basal-septal segment. Left ventricular diastolic parameters are indeterminate. Normal left ventricular filling pressure.  Right Ventricle: The right ventricular size is mildly enlarged. No increase in right ventricular wall thickness. Right ventricular systolic function is normal. Tricuspid regurgitation signal is inadequate for assessing PA pressure.  Left Atrium: Left atrial size was normal in  size.  Right Atrium: Right atrial size was normal in size.  Pericardium: There is no evidence of pericardial effusion.  Mitral Valve: The mitral valve is normal in structure. Trivial mitral valve regurgitation. No evidence of mitral valve stenosis. MV peak gradient, 1.8 mmHg. The mean mitral valve gradient is 1.0 mmHg.  Tricuspid Valve: The tricuspid valve is normal in structure. Tricuspid valve regurgitation is not demonstrated. No evidence of tricuspid stenosis.  Aortic Valve: The aortic valve is tricuspid. Aortic valve regurgitation is not visualized. Aortic valve sclerosis/calcification is present, without any evidence of aortic stenosis. Aortic valve mean gradient measures 3.0 mmHg. Aortic valve peak gradient measures 5.0 mmHg. Aortic valve area, by VTI measures 2.67 cm.  Pulmonic Valve: The pulmonic valve was normal in structure. Pulmonic valve regurgitation is not visualized. No evidence of pulmonic stenosis.  Aorta: The aortic root is normal in size and structure.  Venous: The inferior vena cava is normal in size with greater than 50% respiratory variability, suggesting right atrial pressure of 3 mmHg.  IAS/Shunts: No atrial level shunt detected by color flow Doppler.  Additional Comments: 3D imaging was not performed.   LEFT VENTRICLE PLAX 2D LVIDd:         5.30 cm      Diastology LVIDs:         4.00 cm      LV e' medial:    7.51 cm/s LV PW:         1.10 cm      LV E/e' medial:  8.0 LV IVS:        1.20 cm      LV e' lateral:   13.50 cm/s LVOT diam:     2.20 cm      LV E/e' lateral: 4.4 LV SV:         66 LV SV Index:   27 LVOT Area:     3.80 cm  LV Volumes (MOD) LV vol d, MOD A2C: 203.0 ml LV vol d, MOD A4C: 159.0 ml LV vol s, MOD A2C: 84.5 ml LV vol s, MOD A4C: 83.7 ml LV SV MOD A2C:     118.5 ml LV SV MOD A4C:     159.0 ml LV SV MOD BP:      96.1 ml  RIGHT VENTRICLE             IVC RV Basal diam:  4.30 cm     IVC diam: 1.80 cm RV Mid diam:  4.10 cm RV S  prime:     12.70 cm/s TAPSE (M-mode): 2.4 cm  LEFT ATRIUM             Index        RIGHT ATRIUM           Index LA Vol (A2C):   72.3 ml 29.49 ml/m  RA Area:     21.40 cm LA Vol (A4C):   42.7 ml 17.42 ml/m  RA Volume:   68.30 ml  27.86 ml/m LA Biplane Vol: 56.9 ml 23.21 ml/m AORTIC VALVE                    PULMONIC VALVE AV Area (Vmax):    3.12 cm     PV Vmax:       1.01 m/s AV Area (Vmean):   2.62 cm     PV Peak grad:  4.1 mmHg AV Area (VTI):     2.67 cm AV Vmax:           112.00 cm/s AV Vmean:          84.900 cm/s AV VTI:            0.248 m AV Peak Grad:      5.0 mmHg AV Mean Grad:      3.0 mmHg LVOT Vmax:         91.90 cm/s LVOT Vmean:        58.600 cm/s LVOT VTI:          0.174 m LVOT/AV VTI ratio: 0.70  AORTA Ao Root diam: 3.50 cm Ao Asc diam:  3.50 cm  MITRAL VALVE MV Area (PHT): 2.85 cm    SHUNTS MV Area VTI:   3.31 cm    Systemic VTI:  0.17 m MV Peak grad:  1.8 mmHg    Systemic Diam: 2.20 cm MV Mean grad:  1.0 mmHg MV Vmax:       0.68 m/s MV Vmean:      41.1 cm/s MV Decel Time: 266 msec MV E velocity: 59.90 cm/s MV A velocity: 43.10 cm/s MV E/A ratio:  1.39  Wilbert Bihari MD Electronically signed by Wilbert Bihari MD Signature Date/Time: 12/10/2023/11:37:03 AM    Final   TEE  ECHO TEE 12/11/2023  Narrative TRANSESOPHOGEAL ECHO REPORT    Patient Name:   Devin Robert Weinfeld Jr. Date of Exam: 12/11/2023 Medical Rec #:  [id number]               Height:       74.0 in Accession #:    [id number]              Weight:       265.4 lb Date of Birth:  [birth year]                BSA:          2.451 m Patient Age:    53 years                BP:           134/91 mmHg Patient Gender: M                       HR:           77 bpm. Exam Location:  Inpatient  Procedure: Transesophageal Echo, Limited Color Doppler and Cardiac Doppler (Both Spectral and Color Flow Doppler were utilized during procedure).  Indications:  CAD  History:         Patient has prior  history of Echocardiogram examinations, most recent 12/10/2023. Cardiomyopathy, Previous Myocardial Infarction and CAD; Risk Factors:Hypertension, Dyslipidemia and Non-Smoker.  Sonographer:     Tillman Nora RVT RCS Referring Phys:  WADDELL DELENA DONATH Diagnosing Phys: Oneil Parchment MD  PROCEDURE: After discussion of the risks and benefits of a TEE, an informed consent was obtained from the patient. The transesophogeal probe was passed without difficulty through the esophogus of the patient. Sedation performed by different physician. The patient's vital signs; including heart rate, blood pressure, and oxygen saturation; remained stable throughout the procedure. The patient developed no complications during the procedure.  IMPRESSIONS   1. Left ventricular ejection fraction, by estimation, is 55 to 60%. The left ventricle has normal function. The left ventricle has no regional wall motion abnormalities. 2. Right ventricular systolic function is normal. The right ventricular size is normal. 3. No left atrial/left atrial appendage thrombus was detected. 4. The mitral valve is normal in structure. Mild mitral valve regurgitation. No evidence of mitral stenosis. 5. The aortic valve is normal in structure. Aortic valve regurgitation is not visualized. No aortic stenosis is present. 6. The inferior vena cava is normal in size with greater than 50% respiratory variability, suggesting right atrial pressure of 3 mmHg. 7. Agitated saline contrast bubble study was negative, with no evidence of any interatrial shunt. 8. 3D performed of the mitral valve and demonstrates Normal leaflets.  Conclusion(s)/Recommendation(s): No LA/LAA thrombus identified. Negative bubble study for interatrial shunt. No intracardiac source of embolism detected on this on this transesophageal echocardiogram.  FINDINGS Left Ventricle: Left ventricular ejection fraction, by estimation, is 55 to 60%. The left ventricle has normal  function. The left ventricle has no regional wall motion abnormalities. Strain imaging was not performed. The left ventricular internal cavity size was normal in size. There is no left ventricular hypertrophy.  Right Ventricle: The right ventricular size is normal. No increase in right ventricular wall thickness. Right ventricular systolic function is normal.  Left Atrium: Left atrial size was normal in size. No left atrial/left atrial appendage thrombus was detected.  Right Atrium: Right atrial size was normal in size.  Pericardium: There is no evidence of pericardial effusion.  Mitral Valve: The mitral valve is normal in structure. Mild mitral valve regurgitation. No evidence of mitral valve stenosis.  Tricuspid Valve: The tricuspid valve is normal in structure. Tricuspid valve regurgitation is not demonstrated. No evidence of tricuspid stenosis.  Aortic Valve: The aortic valve is normal in structure. Aortic valve regurgitation is not visualized. No aortic stenosis is present.  Pulmonic Valve: The pulmonic valve was normal in structure. Pulmonic valve regurgitation is not visualized. No evidence of pulmonic stenosis.  Aorta: The aortic root is normal in size and structure.  Venous: The inferior vena cava is normal in size with greater than 50% respiratory variability, suggesting right atrial pressure of 3 mmHg.  IAS/Shunts: No atrial level shunt detected by color flow Doppler. Agitated saline contrast was given intravenously to evaluate for intracardiac shunting. Agitated saline contrast bubble study was negative, with no evidence of any interatrial shunt.   MR Peak grad:    125.4 mmHg MR Mean grad:    80.0 mmHg MR Vmax:         560.00 cm/s MR Vmean:        414.0 cm/s MR PISA:         2.26 cm MR PISA Eff ROA: 28 mm MR  PISA Radius:  0.60 cm  Oneil Parchment MD Electronically signed by Oneil Parchment MD Signature Date/Time: 12/11/2023/2:59:21 PM    Final (Updated)    CT  SCANS  CT CARDIAC SCORING (SELF PAY ONLY) 08/08/2022  Addendum 08/08/2022 10:05 AM ADDENDUM REPORT: 08/08/2022 10:03  EXAM: OVER-READ INTERPRETATION  CT CHEST  The following report is an over-read performed by radiologist Dr. Mabel Converse of [facility] on 08/08/2022. This over-read does not include interpretation of cardiac or coronary anatomy or pathology. The coronary calcium  score interpretation by the cardiologist is attached.  COMPARISON:  06/25/2019  FINDINGS: Normal heart size. No pericardial effusion. Image thoracic aorta is nonaneurysmal. Central pulmonary vasculature within normal limits.  No adenopathy within the included chest. Imaged lung fields are clear. No acute bony or chest wall abnormality.  IMPRESSION: No significant extracardiac findings.   Electronically Signed By: Mabel Converse D.O. On: 08/08/2022 10:03  Narrative CLINICAL DATA:  Cardiovascular Disease Risk stratification  EXAM: Coronary Calcium  Score  TECHNIQUE: A gated, non-contrast computed tomography scan of the heart was performed using 3mm slice thickness. Axial images were analyzed on a dedicated workstation. Calcium  scoring of the coronary arteries was performed using the Agatston method.  FINDINGS: Coronary arteries: Normal origins.  Coronary Calcium  Score:  Left main: 0  Left anterior descending artery: 1243  Left circumflex artery: 0  Right coronary artery: 83  Total: 1326  Percentile: 99  Pericardium: Normal.  Ascending Aorta: Normal caliber.  Aortic atherosclerosis.  Non-cardiac: See separate report from [facility].  IMPRESSION: Coronary calcium  score of 1326. This was 73 percentile for age-, race-, and sex-matched controls.  RECOMMENDATIONS: Coronary artery calcium  (CAC) score is a strong predictor of incident coronary heart disease (CHD) and provides predictive information beyond traditional risk factors. CAC scoring  is reasonable to use in the decision to withhold, postpone, or initiate statin therapy in intermediate-risk or selected borderline-risk asymptomatic adults (age 31-75 years and LDL-C >=70 to <190 mg/dL) who do not have diabetes or established atherosclerotic cardiovascular disease (ASCVD).* In intermediate-risk (10-year ASCVD risk >=7.5% to <20%) adults or selected borderline-risk (10-year ASCVD risk >=5% to <7.5%) adults in whom a CAC score is measured for the purpose of making a treatment decision the following recommendations have been made:  If CAC=0, it is reasonable to withhold statin therapy and reassess in 5 to 10 years, as long as higher risk conditions are absent (diabetes mellitus, family history of premature CHD in first degree relatives (males <55 years; females <65 years), cigarette smoking, or LDL >=190 mg/dL).  If CAC is 1 to 99, it is reasonable to initiate statin therapy for patients >=81 years of age.  If CAC is >=100 or >=75th percentile, it is reasonable to initiate statin therapy at any age.  Cardiology referral should be considered for patients with CAC scores >=400 or >=75th percentile.  *2018 AHA/ACC/AACVPR/AAPA/ABC/ACPM/ADA/AGS/APhA/ASPC/NLA/PCNA Guideline on the Management of Blood Cholesterol: A Report of the American College of Cardiology/American Heart Association Task Force on Clinical Practice Guidelines. J Am Coll Cardiol. 2019;73(24):3168-3209.  Electronically Signed: By: Oneil Parchment M.D. On: 08/08/2022 08:56     ______________________________________________________________________________________________      Risk Assessment/Calculations           Physical Exam VS:  BP 122/72 (BP Location: Right Arm, Patient Position: Sitting, Cuff Size: Large)   Pulse 74   Ht 6' 2 (1.88 m)   Wt 260 lb (117.9 kg)   SpO2 95%   BMI 33.38 kg/m  Wt Readings from Last 3 Encounters:  09/05/24 260 lb (117.9 kg)  06/09/24 260 lb 9.6 oz (118.2  kg)  02/20/24 258 lb 9.6 oz (117.3 kg)    GEN: Well nourished, well developed in no acute distress NECK: No JVD; No carotid bruits CARDIAC: RRR, no murmurs, rubs, gallops RESPIRATORY:  Clear to auscultation without rales, wheezing or rhonchi  ABDOMEN: Soft, non-tender, non-distended EXTREMITIES:  No edema; No deformity   ASSESSMENT AND PLAN CAD -interventions as outlined above.  He has some fatigue and some vague chest discomfort, will arrange for an ischemic evaluation.  Continue Brilinta  90 mg twice daily, aspirin  81 mg daily, Coreg  6.25 mg twice daily, nitroglycerin  as needed. Discussed Wegovy  for MACE but he was really not able to tolerate it secondary to side effects previously. Heart healthy diet and regular cardiovascular exercise encouraged.    Cryptogenic stroke -implantation of ILR, no arrhythmias, no longer on statin therapy, currently on Brilinta  and aspirin . Evaluated by Neurology and Oncology.   HFimpEF - NYHA class I, euvolemic.  Continue Coreg  6.25 mg twice daily, Cozaar  50 mg daily, spironolactone  12.5 mg daily.  Dyslipidemia with elevated LP(a) -LP(a) elevated 234.7, most recent LDL is uncontrolled at 143.  He is working with integrative health team, and is on several supplements.  He was previously on Praluent  as well as a statin--which he subsequently discontinued for possible concern of dementia.  I think the only modifiable factor would be managing his weight and controlling his cholesterol, I will provide him some information with Repatha  that he will research and if he is interested in this he will let us  know.  With his history of strokes and coronary artery disease in early age I would prefer his LDL to be as low as possible, at least less than 50 however he has strong concerns/feelings about lipid-lowering therapies and I have encouraged him to restart Repatha .  Obesity-BMI is 12, he is working on lifestyle modifications.    Informed Consent   Shared Decision  Making/Informed Consent The risks [chest pain, shortness of breath, cardiac arrhythmias, dizziness, blood pressure fluctuations, myocardial infarction, stroke/transient ischemic attack, nausea, vomiting, allergic reaction, radiation exposure, metallic taste sensation and life-threatening complications (estimated to be 1 in 10,000)], benefits (risk stratification, diagnosing coronary artery disease, treatment guidance) and alternatives of a nuclear stress test were discussed in detail with Mr. Migliaccio and he agrees to proceed.     Dispo: MPI, follow up in 6 months with Dr. Ladona.   Signed, Delon JAYSON Hoover, NP

## 2024-09-05 ENCOUNTER — Encounter: Payer: Self-pay | Admitting: Cardiology

## 2024-09-05 ENCOUNTER — Ambulatory Visit: Attending: Cardiology | Admitting: Cardiology

## 2024-09-05 VITALS — BP 122/72 | HR 74 | Ht 74.0 in | Wt 260.0 lb

## 2024-09-05 DIAGNOSIS — R0789 Other chest pain: Secondary | ICD-10-CM | POA: Diagnosis not present

## 2024-09-05 DIAGNOSIS — E7841 Elevated Lipoprotein(a): Secondary | ICD-10-CM | POA: Diagnosis not present

## 2024-09-05 DIAGNOSIS — I5020 Unspecified systolic (congestive) heart failure: Secondary | ICD-10-CM

## 2024-09-05 DIAGNOSIS — Z8673 Personal history of transient ischemic attack (TIA), and cerebral infarction without residual deficits: Secondary | ICD-10-CM

## 2024-09-05 DIAGNOSIS — I2510 Atherosclerotic heart disease of native coronary artery without angina pectoris: Secondary | ICD-10-CM

## 2024-09-05 DIAGNOSIS — E785 Hyperlipidemia, unspecified: Secondary | ICD-10-CM

## 2024-09-05 DIAGNOSIS — I639 Cerebral infarction, unspecified: Secondary | ICD-10-CM

## 2024-09-05 NOTE — Addendum Note (Signed)
 Addended by: CARLIN DELON BROCKS on: 09/05/2024 12:14 PM   Modules accepted: Orders

## 2024-09-05 NOTE — Patient Instructions (Signed)
 Medication Instructions:  Your physician recommends that you continue on your current medications as directed. Please refer to the Current Medication list given to you today.  *If you need a refill on your cardiac medications before your next appointment, please call your pharmacy*  Lab Work: None ordered  Testing/Procedures: Your physician has requested that you have en exercise stress myoview. For further information please visit [email or web address]   Follow-Up: At [facility], you and your health needs are our priority.  As part of our continuing mission to provide you with exceptional heart care, our providers are all part of one team.  This team includes your primary Cardiologist (physician) and Advanced Practice Providers or APPs (Physician Assistants and Nurse Practitioners) who all work together to provide you with the care you need, when you need it.  Your next appointment:   6 month(s)  Provider:   Gordy Bergamo, MD     Thank you for choosing [facility]!!   [phone]   Other Instructions  Please research Repatha  and call the office and let us  know if you are interested in starting this

## 2024-09-09 DIAGNOSIS — G4733 Obstructive sleep apnea (adult) (pediatric): Secondary | ICD-10-CM | POA: Diagnosis not present

## 2024-09-10 ENCOUNTER — Other Ambulatory Visit: Payer: Self-pay | Admitting: Cardiology

## 2024-09-10 DIAGNOSIS — R0789 Other chest pain: Secondary | ICD-10-CM

## 2024-09-10 DIAGNOSIS — I2510 Atherosclerotic heart disease of native coronary artery without angina pectoris: Secondary | ICD-10-CM

## 2024-09-11 NOTE — Addendum Note (Signed)
 Addended by: CARLIN DELON BROCKS on: 09/11/2024 09:07 AM   Modules accepted: Orders

## 2024-09-11 NOTE — Addendum Note (Signed)
 Addended by: GRETEL MAEOLA CROME on: 09/11/2024 09:05 AM   Modules accepted: Orders

## 2024-09-15 ENCOUNTER — Telehealth (HOSPITAL_COMMUNITY): Payer: Self-pay | Admitting: *Deleted

## 2024-09-15 NOTE — Telephone Encounter (Signed)
 Left message on voicemail per DPR in reference to upcoming appointment scheduled on 09/17/24 with detailed instructions given per Myocardial Perfusion Study Information Sheet for the test. LM to arrive 15 minutes early, and that it is imperative to arrive on time for appointment to keep from having the test rescheduled. If you need to cancel or reschedule your appointment, please call the office within 24 hours of your appointment. Failure to do so may result in a cancellation of your appointment, and a $50 no show fee. Phone number given for call back for any questions. Claudene Ronal Quale, RN

## 2024-09-17 ENCOUNTER — Ambulatory Visit: Payer: Self-pay | Admitting: Cardiology

## 2024-09-17 ENCOUNTER — Ambulatory Visit (HOSPITAL_COMMUNITY)
Admission: RE | Admit: 2024-09-17 | Discharge: 2024-09-17 | Disposition: A | Discharge status: Home / Self Care | Source: Ambulatory Visit | Attending: Cardiology | Admitting: Cardiology

## 2024-09-17 ENCOUNTER — Encounter

## 2024-09-17 DIAGNOSIS — R0789 Other chest pain: Secondary | ICD-10-CM | POA: Insufficient documentation

## 2024-09-17 DIAGNOSIS — I2510 Atherosclerotic heart disease of native coronary artery without angina pectoris: Secondary | ICD-10-CM | POA: Insufficient documentation

## 2024-09-17 LAB — MYOCARDIAL PERFUSION IMAGING
Angina Index: 0
Base ST Depression (mm): 0 mm
Duke Treadmill Score: 10
Estimated workload: 12.1
Exercise duration (min): 10 min
Exercise duration (sec): 15 s
LV dias vol: 145 mL (ref 62–150)
LV sys vol: 69 mL
MPHR: 169 {beats}/min
Nuc Stress EF: 52 %
Peak HR: 153 {beats}/min
Percent HR: 90 %
Rest HR: 71 {beats}/min
Rest Nuclear Isotope Dose: 13.1 mCi
SDS: 3
SRS: 10
SSS: 10
ST Depression (mm): 0 mm
Stress Nuclear Isotope Dose: 38.7 mCi
TID: 0.93

## 2024-09-17 MED ORDER — TECHNETIUM TC 99M TETROFOSMIN IV KIT
38.7000 | PACK | Freq: Once | INTRAVENOUS | Status: AC | PRN
  Administered 2024-09-17: 38.7 via INTRAVENOUS

## 2024-09-17 MED ORDER — TECHNETIUM TC 99M TETROFOSMIN IV KIT
13.1000 | PACK | Freq: Once | INTRAVENOUS | Status: AC | PRN
  Administered 2024-09-17: 13.1 via INTRAVENOUS

## 2024-09-17 NOTE — Progress Notes (Signed)
 Spoke with patient regarding myocardial results. Patient has been scheduled or 11/03/24 with Ganji.

## 2024-09-25 ENCOUNTER — Ambulatory Visit

## 2024-09-27 ENCOUNTER — Ambulatory Visit: Attending: Cardiovascular Disease

## 2024-09-27 DIAGNOSIS — I2510 Atherosclerotic heart disease of native coronary artery without angina pectoris: Secondary | ICD-10-CM | POA: Diagnosis not present

## 2024-09-29 LAB — CUP PACEART REMOTE DEVICE CHECK
Date Time Interrogation Session: 20251205232924
Implantable Pulse Generator Implant Date: 20250218

## 2024-10-02 NOTE — Progress Notes (Signed)
 Remote Loop Recorder Transmission

## 2024-10-09 ENCOUNTER — Ambulatory Visit: Payer: Self-pay | Admitting: Cardiovascular Disease

## 2024-10-09 DIAGNOSIS — G4733 Obstructive sleep apnea (adult) (pediatric): Secondary | ICD-10-CM | POA: Diagnosis not present

## 2024-10-26 ENCOUNTER — Ambulatory Visit

## 2024-10-28 ENCOUNTER — Ambulatory Visit

## 2024-10-28 DIAGNOSIS — I2510 Atherosclerotic heart disease of native coronary artery without angina pectoris: Secondary | ICD-10-CM

## 2024-10-28 LAB — CUP PACEART REMOTE DEVICE CHECK
Date Time Interrogation Session: 20260105233745
Implantable Pulse Generator Implant Date: 20250218

## 2024-10-29 NOTE — Progress Notes (Signed)
 Remote Loop Recorder Transmission

## 2024-11-01 ENCOUNTER — Ambulatory Visit: Payer: Self-pay | Admitting: Cardiovascular Disease

## 2024-11-02 NOTE — Progress Notes (Unsigned)
 " Cardiology Office Note:  .   Date:  11/03/2024  ID:  Devin Robert Cohill Jr., DOB 16-Jul-1971, MRN [id number] PCP: Neysa Sensor, MD  [name] Providers Cardiologist:  Gordy Bergamo, MD   History of Present Illness: .   Devin Charleston Brake Jr. is a 54 y.o. 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   Embolic CVA on 12/09/2023 with no cardiac source of cerebral emboli by extensive evaluation including TEE and TCD bubble study and loop recorder implantation in situ.  He is also enrolled in [employer] clinical trial with Asundexian (Factor XI a inhibitor anticoagulant 50 mg versus placebo for secondary stroke prevention.  He was seen 2 months ago for chest pain and underwent stress testing which revealed excellent exercise tolerance with anterior wall scar with no evidence of ischemia with ejection fraction of 52%.  He remains asymptomatic, prefers not to be on a statin.    Discussed the use of AI scribe software for clinical note transcription with the patient, who gave verbal consent to proceed.  History of Present Illness Devin Robert Bencomo Jr. is a 54 year old male with coronary artery disease who presents for follow-up regarding his cardiovascular health and medication management.  He is taking cabetanol, spironolactone , ticagrelor , and losartan . He is not on a statin by preference for nonpharmacologic approaches. He is enrolled in a clinical trial targeting elevated LP(a) and has received the first dose. He had angioplasty with stent placement in November 2024 for coronary artery disease.  He uses CPAP but reports poor sleep due to mask leakage after several hours and is awaiting a larger mask and considering a nasal pillow mask.  He is physically active  with walking, jogging, biking, and rowing. He has never used tobacco and has avoided alcohol since February of last year. He is actively pursuing weight loss and aims to lose 50 more pounds.  He has had a stroke and is concerned about inflammation and clotting markers. He is in a trial for genetically elevated LP(a). His most recent lipid profile showed an LDL of 27.  Cardiac Studies relevent.     Left Heart Catheterization and angioplasty 09/14/23:  Distal RCA: 3.5 x 24 mm Synergy XD DES  3.0 x 32 mm Synergy DES placed in the mid LAD is widely patent from 12/21/2018.    MYOCARDIAL PERFUSION IMAGING 09/17/2024   Findings are consistent with medium size, fixed moderate-severe perfusion defect consistent with prior LAD infarction with no ischemia. The study is overall low-intermediate risk given excellent exercise capacity and no ischemia. LVEF 52%   The study is intermediate risk.   A Bruce protocol stress test was performed. Exercise capacity was excellent. Patient exercised for 10 min and 15 sec. Maximum HR of 153 bpm. MPHR 90.0%. Peak METS 12.1. The patient experienced no angina during the test. The test was stopped because the patient experienced fatigue. The patient reported no symptoms during the stress test. Normal blood pressure and normal heart rate response noted during stress. Heart rate recovery was normal.  ECHOCARDIOGRAM COMPLETE 12/10/2023  1. Left ventricular ejection fraction, by estimation, is 45 to 50%. The left ventricle has mildly decreased function. The left ventricle demonstrates regional wall motion abnormalities (see scoring diagram/findings for description). There is mild left ventricular hypertrophy of the basal-septal  segment. Left ventricular diastolic parameters are indeterminate. There is akinesis of the left ventricular, apical segment. There is hypokinesis of the left ventricular, apical septal wall, inferior wall and anterior wall.  EKG:      Labs    Lipoprotein (a)  Date/Time Value Ref Range Status  01/15/2024 04:41 PM 234.7 (H) <75.0 nmol/L Final    Comment:    Note:  Values greater than or equal to 75.0 nmol/L may        indicate an independent risk factor for CHD,        but must be evaluated with caution when applied        to non-Caucasian populations due to the        influence of genetic factors on Lp(a) across        ethnicities.     Recent Labs    12/11/23 0605 12/21/23 1116 01/16/24 0010 01/16/24 0013 01/16/24 1027 02/20/24 0822  NA 140  --  139 141  --  140  K 4.1  --  3.5 3.5  --  4.4  CL 105  --  106 104  --  106  CO2 24  --  24  --   --  30  GLUCOSE 93  --  98 92  --  80  BUN 12  --  13 13  --  12  CREATININE 1.13  --  1.12 1.10 1.07 1.07  CALCIUM  8.6*  --  8.5*  --   --  9.1  GFRNONAA >60   < > >60  --  >60 >60   < > = values in this interval not displayed.    Lab Results  Component Value Date   ALT 40 02/20/2024   AST 27 02/20/2024   ALKPHOS 84 02/20/2024   BILITOT 0.8 02/20/2024      Latest Ref Rng & Units 02/20/2024    8:22 AM 01/16/2024   10:27 AM 01/16/2024   12:13 AM  CBC  WBC 4.0 - 10.5 K/uL 5.7  7.1    Hemoglobin 13.0 - 17.0 g/dL 84.5  83.2  83.9   Hematocrit 39.0 - 52.0 % 44.0  48.8  47.0   Platelets 150 - 400 K/uL 211  202     Lab Results  Component Value Date   HGBA1C 4.9 12/11/2023    Lab Results  Component Value Date   TSH 1.330 01/15/2024    Care everywhere/Faxed External Labs:  02/07/2024: TC 73, HDL 29, LDL 27, TG 78  ROS  Review of Systems  Cardiovascular:  Negative for chest pain, dyspnea on exertion and leg swelling.   Physical Exam:   VS:  BP 116/74   Pulse 73   Ht 6' 2 (1.88 m)   Wt 265 lb 6.4 oz (120.4 kg)   SpO2 96%   BMI 34.08 kg/m    Wt Readings from Last 3 Encounters:  11/03/24 265 lb 6.4 oz (120.4 kg)  09/05/24 260 lb (117.9 kg)  06/09/24 260 lb 9.6 oz (118.2 kg)    BP Readings from Last 3 Encounters:  11/03/24 116/74  09/05/24 122/72   06/09/24 110/68   Physical Exam Constitutional:      Appearance: He is obese.  Neck:     Vascular: No carotid bruit or JVD.  Cardiovascular:     Rate and Rhythm: Normal rate and regular rhythm.     Pulses: Intact distal pulses.     Heart sounds: Normal heart sounds. No murmur heard.  No gallop.  Pulmonary:     Effort: Pulmonary effort is normal.     Breath sounds: Normal breath sounds.  Abdominal:     General: Bowel sounds are normal.     Palpations: Abdomen is soft.  Musculoskeletal:     Right lower leg: No edema.     Left lower leg: No edema.    ASSESSMENT AND PLAN: .      ICD-10-CM   1. Coronary artery disease involving native coronary artery of native heart without angina pectoris  I25.10 nitroGLYCERIN  (NITROSTAT ) 0.4 MG SL tablet    losartan  (COZAAR ) 50 MG tablet    clopidogrel  (PLAVIX ) 75 MG tablet    2. History of multiple cerebrovascular accidents (CVAs)  Z86.73     3. Hyperlipidemia LDL goal <55  E78.5     4. Elevated Lp(a)  E78.41     5. Enrolled in clinical trial of drug: ACCLAIM-LP9a) (Lepodisiran vs Placebo-secondary prevention)  Z00.6      Assessment & Plan Coronary artery disease, post-angioplasty and stent Coronary artery disease is well-managed post-angioplasty and stent placement in November 2024. Stress test results were satisfactory, indicating no significant ischemia. Echocardiogram shows mildly reduced heart function with an ejection fraction of 45-50% and no valvular abnormalities. He is on dual antiplatelet therapy with Brilinta , which will be transitioned to Plavix  as it has been a year since the procedure. - Switched from Brilinta  to Plavix  (clopidogrel ) for antiplatelet therapy. - Discontinued aspirin . - Prescribed nitroglycerin  for use as needed. - Continue losartan  50 mg once daily.  History of multiple cerebrovascular accidents (CVAs) He has a history of multiple CVAs, has loop recorder in place with no evidence of atrial fibrillation.   No cardiac sources of emboli, stroke felt to be cryptogenic.  Hyperlipidemia with elevated lipoprotein(a) He has hyperlipidemia with significantly elevated lipoprotein(a) levels at 234, which is a high-risk factor for coronary events. He is currently enrolled in a trial for a drug that may reduce lipoprotein(a) levels.  Enrolled in clinical trial of drug: ACCLAIM-LP9a) (Lepodisiran vs Placebo-secondary prevention)  He is not on statins due to personal preference for natural management, but PCSK9 inhibitors like Repatha  were discussed as an alternative. He is also considering Mounjaro for weight loss, which may aid in managing cholesterol levels. - Continue participation in the lipoprotein(a) trial. - Consider PCSK9 inhibitors like Repatha  if trial is not effective. - Discussed potential use of Mounjaro for weight loss.  Obstructive sleep apnea He has obstructive sleep apnea and uses a CPAP machine intermittently due to mask fit issues. He reports improved sleep quality with CPAP use but experiences mask leaks after a few hours. He is considering a nasal pillow mask to improve comfort and adherence. - Follow up with CPAP provider to address mask fit issues. - Consider trying a nasal pillow mask for CPAP use.  Follow up: 1 Year  CAD, Mixed hyperchol and elevated LPA   Signed,  Gordy Bergamo, MD, [facility] 11/03/2024, 9:11 AM [facility] [address] [place], [place] [address] Phone: [phone]. Fax:  [phone]  "

## 2024-11-03 ENCOUNTER — Encounter: Payer: Self-pay | Admitting: Cardiology

## 2024-11-03 ENCOUNTER — Ambulatory Visit: Attending: Cardiology | Admitting: Cardiology

## 2024-11-03 VITALS — BP 116/74 | HR 73 | Ht 74.0 in | Wt 265.4 lb

## 2024-11-03 DIAGNOSIS — E7841 Elevated Lipoprotein(a): Secondary | ICD-10-CM | POA: Insufficient documentation

## 2024-11-03 DIAGNOSIS — Z006 Encounter for examination for normal comparison and control in clinical research program: Secondary | ICD-10-CM | POA: Diagnosis not present

## 2024-11-03 DIAGNOSIS — E785 Hyperlipidemia, unspecified: Secondary | ICD-10-CM | POA: Diagnosis not present

## 2024-11-03 DIAGNOSIS — I2510 Atherosclerotic heart disease of native coronary artery without angina pectoris: Secondary | ICD-10-CM

## 2024-11-03 DIAGNOSIS — Z8673 Personal history of transient ischemic attack (TIA), and cerebral infarction without residual deficits: Secondary | ICD-10-CM | POA: Diagnosis not present

## 2024-11-03 MED ORDER — CLOPIDOGREL BISULFATE 75 MG PO TABS: 75.0000 mg | ORAL_TABLET | Freq: Every day | ORAL | 3 refills | Status: AC

## 2024-11-03 MED ORDER — LOSARTAN POTASSIUM 50 MG PO TABS: 50.0000 mg | ORAL_TABLET | Freq: Every day | ORAL | 3 refills | Status: AC

## 2024-11-03 MED ORDER — NITROGLYCERIN 0.4 MG SL SUBL: 0.4000 mg | SUBLINGUAL_TABLET | SUBLINGUAL | 3 refills | Status: AC | PRN

## 2024-11-03 NOTE — Patient Instructions (Signed)
 We recommend signing up for the patient portal called MyChart.  Patients are able to view lab/test results, encounter notes, upcoming appointments, etc.  Non-urgent messages can be sent to your provider as well, go to [email or web address].   Medication Instructions:  No changes *If you need a refill on your cardiac medications before your next appointment, please call your pharmacy*  Lab Work: None ordered If you have labs (blood work) drawn today and your tests are completely normal, you will receive your results only by: MyChart Message (if you have MyChart) OR A paper copy in the mail If you have any lab test that is abnormal or we need to change your treatment, we will call you to review the results.  Testing/Procedures: None ordered  Follow-Up: At [facility], you and your health needs are our priority.  As part of our continuing mission to provide you with exceptional heart care, our providers are all part of one team.  This team includes your primary Cardiologist (physician) and Advanced Practice Providers or APPs (Physician Assistants and Nurse Practitioners) who all work together to provide you with the care you need, when you need it.  Your next appointment:   1 year(s)  Provider:   Gordy Bergamo, MD    We recommend signing up for the patient portal called MyChart.  Sign up information is provided on this After Visit Summary.  MyChart is used to connect with patients for Virtual Visits (Telemedicine).  Patients are able to view lab/test results, encounter notes, upcoming appointments, etc.  Non-urgent messages can be sent to your provider as well.   To learn more about what you can do with MyChart, go to [email or web address].

## 2024-11-06 ENCOUNTER — Telehealth: Payer: Self-pay | Admitting: Neurology

## 2024-11-06 NOTE — Telephone Encounter (Signed)
"  Mychart msg  "

## 2024-11-11 ENCOUNTER — Ambulatory Visit: Admitting: Neurology

## 2024-11-26 ENCOUNTER — Ambulatory Visit

## 2024-11-28 ENCOUNTER — Ambulatory Visit

## 2024-11-28 LAB — CUP PACEART REMOTE DEVICE CHECK
Date Time Interrogation Session: 20260205232908
Implantable Pulse Generator Implant Date: 20250218

## 2024-12-24 ENCOUNTER — Ambulatory Visit: Admitting: Neurology

## 2024-12-27 ENCOUNTER — Ambulatory Visit

## 2024-12-29 ENCOUNTER — Ambulatory Visit

## 2025-01-27 ENCOUNTER — Ambulatory Visit

## 2025-01-29 ENCOUNTER — Ambulatory Visit

## 2025-03-01 ENCOUNTER — Ambulatory Visit

## 2025-04-01 ENCOUNTER — Ambulatory Visit

## 2025-05-02 ENCOUNTER — Ambulatory Visit

## 2025-06-02 ENCOUNTER — Ambulatory Visit

## 2025-07-03 ENCOUNTER — Ambulatory Visit

## 2025-08-03 ENCOUNTER — Ambulatory Visit

## 2025-09-03 ENCOUNTER — Ambulatory Visit

## 2025-10-04 ENCOUNTER — Ambulatory Visit

## 2025-11-04 ENCOUNTER — Ambulatory Visit
